# Patient Record
Sex: Female | Born: 1965 | Race: White | Hispanic: No | Marital: Married | Smoking: Never smoker
Health system: Southern US, Community
[De-identification: ages and names within clinical notes are randomized; demographics above are authoritative.]

## PROBLEM LIST (undated history)

## (undated) DIAGNOSIS — J309 Allergic rhinitis, unspecified: Secondary | ICD-10-CM

## (undated) DIAGNOSIS — J45909 Unspecified asthma, uncomplicated: Secondary | ICD-10-CM

## (undated) DIAGNOSIS — E079 Disorder of thyroid, unspecified: Secondary | ICD-10-CM

## (undated) DIAGNOSIS — F32A Depression, unspecified: Secondary | ICD-10-CM

## (undated) DIAGNOSIS — E785 Hyperlipidemia, unspecified: Secondary | ICD-10-CM

## (undated) DIAGNOSIS — M1990 Unspecified osteoarthritis, unspecified site: Secondary | ICD-10-CM

## (undated) DIAGNOSIS — M797 Fibromyalgia: Secondary | ICD-10-CM

## (undated) DIAGNOSIS — E039 Hypothyroidism, unspecified: Secondary | ICD-10-CM

## (undated) DIAGNOSIS — F329 Major depressive disorder, single episode, unspecified: Secondary | ICD-10-CM

## (undated) DIAGNOSIS — L309 Dermatitis, unspecified: Secondary | ICD-10-CM

## (undated) DIAGNOSIS — G894 Chronic pain syndrome: Secondary | ICD-10-CM

## (undated) DIAGNOSIS — N390 Urinary tract infection, site not specified: Secondary | ICD-10-CM

## (undated) DIAGNOSIS — G43909 Migraine, unspecified, not intractable, without status migrainosus: Secondary | ICD-10-CM

## (undated) DIAGNOSIS — K559 Vascular disorder of intestine, unspecified: Secondary | ICD-10-CM

## (undated) DIAGNOSIS — F419 Anxiety disorder, unspecified: Secondary | ICD-10-CM

## (undated) DIAGNOSIS — K219 Gastro-esophageal reflux disease without esophagitis: Secondary | ICD-10-CM

## (undated) HISTORY — DX: Hyperlipidemia, unspecified: E78.5

## (undated) HISTORY — DX: Migraine, unspecified, not intractable, without status migrainosus: G43.909

## (undated) HISTORY — DX: Allergic rhinitis, unspecified: J30.9

## (undated) HISTORY — PX: TUBAL LIGATION: SHX77

## (undated) HISTORY — DX: Major depressive disorder, single episode, unspecified: F32.9

## (undated) HISTORY — DX: Unspecified osteoarthritis, unspecified site: M19.90

## (undated) HISTORY — DX: Urinary tract infection, site not specified: N39.0

## (undated) HISTORY — DX: Depression, unspecified: F32.A

---

## 2005-01-14 ENCOUNTER — Ambulatory Visit: Payer: Self-pay | Admitting: Urology

## 2005-05-24 ENCOUNTER — Ambulatory Visit: Payer: Self-pay

## 2009-02-12 ENCOUNTER — Emergency Department (HOSPITAL_COMMUNITY): Admission: EM | Admit: 2009-02-12 | Discharge: 2009-02-12 | Payer: Self-pay | Admitting: Emergency Medicine

## 2010-05-23 ENCOUNTER — Ambulatory Visit: Payer: Self-pay | Admitting: Pain Medicine

## 2010-05-29 ENCOUNTER — Ambulatory Visit: Payer: Self-pay | Admitting: Family Medicine

## 2010-06-04 ENCOUNTER — Ambulatory Visit: Payer: Self-pay | Admitting: Pain Medicine

## 2010-06-07 ENCOUNTER — Ambulatory Visit: Payer: Self-pay | Admitting: Pain Medicine

## 2010-06-18 ENCOUNTER — Ambulatory Visit: Payer: Self-pay | Admitting: Pain Medicine

## 2010-06-19 ENCOUNTER — Ambulatory Visit: Payer: Self-pay | Admitting: Pain Medicine

## 2010-06-21 ENCOUNTER — Ambulatory Visit: Payer: Self-pay | Admitting: Pain Medicine

## 2010-11-18 LAB — DIFFERENTIAL
Basophils Absolute: 0.1 10*3/uL (ref 0.0–0.1)
Basophils Relative: 1 % (ref 0–1)
Eosinophils Absolute: 0.1 10*3/uL (ref 0.0–0.7)
Eosinophils Relative: 1 % (ref 0–5)
Monocytes Absolute: 0.6 10*3/uL (ref 0.1–1.0)
Neutro Abs: 5.5 10*3/uL (ref 1.7–7.7)

## 2010-11-18 LAB — CBC
HCT: 37.8 % (ref 36.0–46.0)
Platelets: 282 10*3/uL (ref 150–400)
RDW: 12.3 % (ref 11.5–15.5)

## 2010-11-18 LAB — URINALYSIS, ROUTINE W REFLEX MICROSCOPIC
Hgb urine dipstick: NEGATIVE
Protein, ur: NEGATIVE mg/dL
Urobilinogen, UA: 0.2 mg/dL (ref 0.0–1.0)

## 2010-11-18 LAB — POCT PREGNANCY, URINE: Preg Test, Ur: NEGATIVE

## 2012-05-20 ENCOUNTER — Emergency Department: Payer: Self-pay | Admitting: Emergency Medicine

## 2012-05-20 LAB — URINALYSIS, COMPLETE
Bilirubin,UR: NEGATIVE
Blood: NEGATIVE
Glucose,UR: NEGATIVE mg/dL (ref 0–75)
Ketone: NEGATIVE
Leukocyte Esterase: NEGATIVE
Specific Gravity: 1.005 (ref 1.003–1.030)
Squamous Epithelial: 4

## 2012-05-20 LAB — COMPREHENSIVE METABOLIC PANEL
Albumin: 3.7 g/dL (ref 3.4–5.0)
Alkaline Phosphatase: 92 U/L (ref 50–136)
Anion Gap: 8 (ref 7–16)
Bilirubin,Total: 0.2 mg/dL (ref 0.2–1.0)
Calcium, Total: 9 mg/dL (ref 8.5–10.1)
Creatinine: 0.78 mg/dL (ref 0.60–1.30)
EGFR (African American): >60
EGFR (Non-African Amer.): >60
Glucose: 84 mg/dL (ref 65–99)
Osmolality: 279 (ref 275–301)
Sodium: 141 mmol/L (ref 136–145)

## 2012-05-20 LAB — TROPONIN I: Troponin-I: <0.02 ng/mL

## 2012-05-20 LAB — CBC
HCT: 40.3 % (ref 35.0–47.0)
MCH: 30.4 pg (ref 26.0–34.0)
MCHC: 34.6 g/dL (ref 32.0–36.0)
RDW: 12.5 % (ref 11.5–14.5)

## 2012-06-30 ENCOUNTER — Ambulatory Visit: Payer: Self-pay | Admitting: Neurology

## 2012-09-19 ENCOUNTER — Emergency Department: Payer: Self-pay | Admitting: Internal Medicine

## 2012-09-19 LAB — RAPID INFLUENZA A&B ANTIGENS

## 2013-06-28 ENCOUNTER — Emergency Department: Payer: Self-pay | Admitting: Emergency Medicine

## 2013-06-28 LAB — URINALYSIS, COMPLETE
Bacteria: NONE SEEN
Blood: NEGATIVE
Ketone: NEGATIVE
Leukocyte Esterase: NEGATIVE
Nitrite: NEGATIVE
Ph: 6 (ref 4.5–8.0)
Protein: NEGATIVE
Squamous Epithelial: 1
WBC UR: 2 /HPF (ref 0–5)

## 2013-06-28 LAB — CBC
HGB: 13.8 g/dL (ref 12.0–16.0)
Platelet: 356 10*3/uL (ref 150–440)
RBC: 4.58 10*6/uL (ref 3.80–5.20)
RDW: 13 % (ref 11.5–14.5)

## 2013-06-28 LAB — COMPREHENSIVE METABOLIC PANEL
Bilirubin,Total: 0.2 mg/dL (ref 0.2–1.0)
Calcium, Total: 8.9 mg/dL (ref 8.5–10.1)
Creatinine: 0.86 mg/dL (ref 0.60–1.30)
EGFR (African American): >60
Glucose: 89 mg/dL (ref 65–99)
Osmolality: 273 (ref 275–301)
SGOT(AST): 23 U/L (ref 15–37)
Sodium: 137 mmol/L (ref 136–145)
Total Protein: 7.3 g/dL (ref 6.4–8.2)

## 2013-06-28 LAB — DIFFERENTIAL
Eosinophil #: 0.3 10*3/uL (ref 0.0–0.7)
Eosinophil %: 1.4 %
Lymphocyte #: 4.7 10*3/uL — ABNORMAL HIGH (ref 1.0–3.6)
Lymphocyte %: 26.5 %

## 2013-06-28 LAB — WET PREP, GENITAL

## 2013-07-29 DIAGNOSIS — N3020 Other chronic cystitis without hematuria: Secondary | ICD-10-CM | POA: Insufficient documentation

## 2013-07-29 DIAGNOSIS — N393 Stress incontinence (female) (male): Secondary | ICD-10-CM | POA: Insufficient documentation

## 2013-07-29 DIAGNOSIS — N2889 Other specified disorders of kidney and ureter: Secondary | ICD-10-CM | POA: Insufficient documentation

## 2014-05-28 ENCOUNTER — Inpatient Hospital Stay: Payer: Self-pay | Admitting: Internal Medicine

## 2014-05-28 LAB — CBC WITH DIFFERENTIAL/PLATELET
BASOS ABS: 0.1 10*3/uL (ref 0.0–0.1)
BASOS ABS: 0.1 10*3/uL (ref 0.0–0.1)
BASOS ABS: 0.1 10*3/uL (ref 0.0–0.1)
BASOS PCT: 0.6 %
BASOS PCT: 0.6 %
BASOS PCT: 0.6 %
EOS ABS: 0.2 10*3/uL (ref 0.0–0.7)
EOS ABS: 0.1 10*3/uL (ref 0.0–0.7)
EOS PCT: 1.5 %
Eosinophil #: 0 10*3/uL (ref 0.0–0.7)
Eosinophil %: 0.3 %
Eosinophil %: 0.8 %
HCT: 46 % (ref 35.0–47.0)
HCT: 43.5 % (ref 35.0–47.0)
HCT: 41.5 % (ref 35.0–47.0)
HGB: 15.3 g/dL (ref 12.0–16.0)
HGB: 13.9 g/dL (ref 12.0–16.0)
HGB: 13.7 g/dL (ref 12.0–16.0)
LYMPHS ABS: 2 10*3/uL (ref 1.0–3.6)
LYMPHS PCT: 12.6 %
Lymphocyte #: 3 10*3/uL (ref 1.0–3.6)
Lymphocyte #: 1.8 10*3/uL (ref 1.0–3.6)
Lymphocyte %: 21.6 %
Lymphocyte %: 13.7 %
MCH: 29.7 pg (ref 26.0–34.0)
MCH: 28.7 pg (ref 26.0–34.0)
MCH: 29.5 pg (ref 26.0–34.0)
MCHC: 33.4 g/dL (ref 32.0–36.0)
MCHC: 32 g/dL (ref 32.0–36.0)
MCHC: 33 g/dL (ref 32.0–36.0)
MCV: 89 fL (ref 80–100)
MCV: 90 fL (ref 80–100)
MCV: 90 fL (ref 80–100)
MONO ABS: 1 x10 3/mm — AB (ref 0.2–0.9)
MONO ABS: 0.9 x10 3/mm (ref 0.2–0.9)
MONO ABS: 1.4 x10 3/mm — AB (ref 0.2–0.9)
MONOS PCT: 6.3 %
Monocyte %: 7.5 %
Monocyte %: 9.4 %
NEUTROS ABS: 9.5 10*3/uL — AB (ref 1.4–6.5)
NEUTROS ABS: 10.8 10*3/uL — AB (ref 1.4–6.5)
NEUTROS PCT: 68.8 %
NEUTROS PCT: 80.2 %
NEUTROS PCT: 75.5 %
Neutrophil #: 11.3 10*3/uL — ABNORMAL HIGH (ref 1.4–6.5)
PLATELETS: 273 10*3/uL (ref 150–440)
Platelet: 240 10*3/uL (ref 150–440)
Platelet: 231 10*3/uL (ref 150–440)
RBC: 5.16 10*6/uL (ref 3.80–5.20)
RBC: 4.85 10*6/uL (ref 3.80–5.20)
RBC: 4.64 10*6/uL (ref 3.80–5.20)
RDW: 12.9 % (ref 11.5–14.5)
RDW: 12.9 % (ref 11.5–14.5)
RDW: 12.9 % (ref 11.5–14.5)
WBC: 13.8 10*3/uL — AB (ref 3.6–11.0)
WBC: 14.1 10*3/uL — AB (ref 3.6–11.0)
WBC: 14.4 10*3/uL — AB (ref 3.6–11.0)

## 2014-05-28 LAB — COMPREHENSIVE METABOLIC PANEL
ALT: 27 U/L
AST: 29 U/L (ref 15–37)
Albumin: 4 g/dL (ref 3.4–5.0)
Alkaline Phosphatase: 148 U/L — ABNORMAL HIGH
Anion Gap: 9 (ref 7–16)
BILIRUBIN TOTAL: 0.3 mg/dL (ref 0.2–1.0)
BUN: 8 mg/dL (ref 7–18)
CALCIUM: 8.8 mg/dL (ref 8.5–10.1)
CHLORIDE: 106 mmol/L (ref 98–107)
CREATININE: 0.91 mg/dL (ref 0.60–1.30)
Co2: 25 mmol/L (ref 21–32)
EGFR (African American): >60
GLUCOSE: 94 mg/dL (ref 65–99)
OSMOLALITY: 277 (ref 275–301)
Potassium: 3.8 mmol/L (ref 3.5–5.1)
SODIUM: 140 mmol/L (ref 136–145)
TOTAL PROTEIN: 8 g/dL (ref 6.4–8.2)

## 2014-05-28 LAB — PREGNANCY, URINE: PREGNANCY TEST, URINE: NEGATIVE m[IU]/mL

## 2014-05-28 LAB — URINALYSIS, COMPLETE
BILIRUBIN, UR: NEGATIVE
Bacteria: NONE SEEN
GLUCOSE, UR: NEGATIVE mg/dL (ref 0–75)
Ketone: NEGATIVE
Leukocyte Esterase: NEGATIVE
NITRITE: NEGATIVE
PROTEIN: NEGATIVE
Ph: 5 (ref 4.5–8.0)
RBC,UR: 4 /HPF (ref 0–5)
SPECIFIC GRAVITY: 1.014 (ref 1.003–1.030)
Squamous Epithelial: <1
WBC UR: 1 /HPF (ref 0–5)

## 2014-05-28 LAB — CLOSTRIDIUM DIFFICILE(ARMC)

## 2014-05-28 LAB — LIPASE, BLOOD: LIPASE: 143 U/L (ref 73–393)

## 2014-05-28 LAB — TROPONIN I: Troponin-I: <0.02 ng/mL

## 2014-05-29 LAB — COMPREHENSIVE METABOLIC PANEL
ALBUMIN: 3.6 g/dL (ref 3.4–5.0)
Alkaline Phosphatase: 111 U/L
Anion Gap: 11 (ref 7–16)
BUN: 7 mg/dL (ref 7–18)
Bilirubin,Total: 0.4 mg/dL (ref 0.2–1.0)
CALCIUM: 8.6 mg/dL (ref 8.5–10.1)
CHLORIDE: 104 mmol/L (ref 98–107)
CO2: 24 mmol/L (ref 21–32)
Creatinine: 0.96 mg/dL (ref 0.60–1.30)
GLUCOSE: 130 mg/dL — AB (ref 65–99)
OSMOLALITY: 277 (ref 275–301)
POTASSIUM: 3.6 mmol/L (ref 3.5–5.1)
SGOT(AST): 21 U/L (ref 15–37)
SGPT (ALT): 21 U/L
Sodium: 139 mmol/L (ref 136–145)
TOTAL PROTEIN: 7.3 g/dL (ref 6.4–8.2)

## 2014-05-29 LAB — CBC WITH DIFFERENTIAL/PLATELET
BASOS ABS: 0.1 10*3/uL (ref 0.0–0.1)
Basophil %: 0.4 %
EOS ABS: 0.2 10*3/uL (ref 0.0–0.7)
EOS PCT: 1.5 %
HCT: 41.3 % (ref 35.0–47.0)
HGB: 13.9 g/dL (ref 12.0–16.0)
Lymphocyte #: 1.8 10*3/uL (ref 1.0–3.6)
Lymphocyte %: 12.9 %
MCH: 29.9 pg (ref 26.0–34.0)
MCHC: 33.6 g/dL (ref 32.0–36.0)
MCV: 89 fL (ref 80–100)
MONO ABS: 1.1 x10 3/mm — AB (ref 0.2–0.9)
MONOS PCT: 7.6 %
NEUTROS ABS: 10.7 10*3/uL — AB (ref 1.4–6.5)
Neutrophil %: 77.6 %
Platelet: 227 10*3/uL (ref 150–440)
RBC: 4.63 10*6/uL (ref 3.80–5.20)
RDW: 13.2 % (ref 11.5–14.5)
WBC: 13.8 10*3/uL — AB (ref 3.6–11.0)

## 2014-05-29 LAB — T4, FREE: FREE THYROXINE: 0.73 ng/dL — AB (ref 0.76–1.46)

## 2014-05-29 LAB — TSH: THYROID STIMULATING HORM: 42.4 u[IU]/mL — AB

## 2014-05-30 LAB — STOOL CULTURE

## 2014-06-01 LAB — PATHOLOGY REPORT

## 2014-06-02 LAB — CULTURE, BLOOD (SINGLE)

## 2014-08-08 DIAGNOSIS — B9689 Other specified bacterial agents as the cause of diseases classified elsewhere: Secondary | ICD-10-CM | POA: Insufficient documentation

## 2014-12-03 NOTE — Consult Note (Signed)
Chief Complaint:  Subjective/Chief Complaint Overall much better. Less abdominal pain. Had 2 bloody BM's last night.   VITAL SIGNS/ANCILLARY NOTES: **Vital Signs.:   18-Oct-15 07:39  Vital Signs Type Q 4hr  Temperature Temperature (F) 98  Celsius 36.6  Temperature Source oral  Pulse Pulse 79  Respirations Respirations 18  Systolic BP Systolic BP 798  Diastolic BP (mmHg) Diastolic BP (mmHg) 75  Mean BP 86  Systolic BP Systolic BP 921  Diastolic BP (mmHg) Diastolic BP (mmHg) 75  Pulse Lying Pulse Lying 86  Systolic BP Systolic BP 194  Diastolic BP (mmHg) Diastolic BP (mmHg) 81  Pulse Pulse Sitting 97  Systolic BP Systolic BP 174  Diastolic BP (mmHg) Diastolic BP (mmHg) 76  Pulse Standing Pulse Standing 111  Pulse Ox % Pulse Ox % 97  Pulse Ox Activity Level  At rest  Oxygen Delivery Room Air/ 21 %   Brief Assessment:  GEN no acute distress   Cardiac Regular   Respiratory clear BS   Gastrointestinal mild left sided abdominal tenderness   Lab Results: Thyroid:  18-Oct-15 05:16   Thyroid Stimulating Hormone  42.4 (0.45-4.50 (IU = International Unit)  ----------------------- Pregnant patients have  different reference  ranges for TSH:  - - - - - - - - - -  Pregnant, first trimetser:  0.36 - 2.50 uIU/mL)  Hepatic:  18-Oct-15 05:16   Bilirubin, Total 0.4  Alkaline Phosphatase 111 (46-116 NOTE: New Reference Range 03/01/14)  SGPT (ALT) 21 (14-63 NOTE: New Reference Range 03/01/14)  SGOT (AST) 21  Total Protein, Serum 7.3  Albumin, Serum 3.6  Routine Chem:  18-Oct-15 05:16   Glucose, Serum  130  BUN 7  Creatinine (comp) 0.96  Sodium, Serum 139  Potassium, Serum 3.6  Chloride, Serum 104  CO2, Serum 24  Calcium (Total), Serum 8.6  Osmolality (calc) 277  eGFR (African American) >60  eGFR (Non-African American) >60 (eGFR values <71m/min/1.73 m2 may be an indication of chronic kidney disease (CKD). Calculated eGFR, using the MRDR Study equation, is useful  in  patients with stable renal function. The eGFR calculation will not be reliable in acutely ill patients when serum creatinine is changing rapidly. It is not useful in patients on dialysis. The eGFR calculation may not be applicable to patients at the low and high extremes of body sizes, pregnant women, and vetetarians.)  Anion Gap 11  Routine Hem:  18-Oct-15 05:16   WBC (CBC)  13.8  RBC (CBC) 4.63  Hemoglobin (CBC) 13.9  Hematocrit (CBC) 41.3  Platelet Count (CBC) 227  MCV 89  MCH 29.9  MCHC 33.6  RDW 13.2  Neutrophil % 77.6  Lymphocyte % 12.9  Monocyte % 7.6  Eosinophil % 1.5  Basophil % 0.4  Neutrophil #  10.7  Lymphocyte # 1.8  Monocyte #  1.1  Eosinophil # 0.2  Basophil # 0.1 (Result(s) reported on 29 May 2014 at 0[facility])   Radiology Results: CT:    17-Oct-15 03:56, CT Abdomen and Pelvis With Contrast  CT Abdomen and Pelvis With Contrast   REASON FOR EXAM:    (1) LLQ PAIN; (2) RECTAL BLEEDING;    NOTE: Nursing   to Give Oral CT Contrast  COMMENTS:   May transport without cardiac monitor    PROCEDURE: CT  - CT ABDOMEN / PELVIS  W  - May 28 2014  3:56AM     CLINICAL DATA:  Left lower quadrant pain and rectal bleeding.  Initial encounter    EXAM:  CT ABDOMEN AND PELVIS WITH CONTRAST    TECHNIQUE:  Multidetector CT imaging of the abdomen and pelvis was performed  using the standard protocol following bolus administration of  intravenous contrast.    CONTRAST:  100 cc Isovue-300 intravenous    COMPARISON:  06/28/2013    FINDINGS:  BODY WALL: Unremarkable.    LOWER CHEST: Unremarkable.    ABDOMEN/PELVIS:    Liver: No focal abnormality.  Biliary: No evidence of biliary obstruction or stone.    Pancreas: Unremarkable.    Spleen: Unremarkable.    Adrenals: Unremarkable.    Kidneys and ureters: 7 mm lesion in the lower pole right kidney has  an unchanged size and appearance. On delayed imaging Hounsfield  units measure 60 to 70, greater than on  precontrast imaging. No  hydronephrosis.    Bladder: Unremarkable.  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    Bowel: There is circumferential thickening of the distal transverse  colon secondary a submucosal edema. These changes are fairly focal.  Colitis such as this is usually infectious, occasionally  inflammatory. These changes are near the watershed region of the  colon, but there is no significant atherosclerotic disease typically  seen in patients with ischemic colitis. There is no related  obstruction or perforation. Negative appendix.    Retroperitoneum: No mass or adenopathy.    Peritoneum: No ascites or pneumoperitoneum.  Vascular: No acute abnormality.    OSSEOUS: No acute abnormalities.     IMPRESSION:  1. Transverse colitis.  Further discussion recorded above.  2. Unchanged 7 mm lesion in the right kidney which may have delayed  enhancement. A renal MRI could likely definitively characterize.  3. Left gonadal vein reflux.      Electronically Signed    By: Jorje Guild M.D.    On: 05/28/2014 04:15     Verified By: Gilford Silvius, M.D.,   Assessment/Plan:  Assessment/Plan:  Assessment Colitis.   Plan Plan colonoscopy tomorrow after bowel prep today.   Electronic Signatures: Verdie Shire (MD)  (Signed 18-Oct-15 11:07)  Authored: Chief Complaint, VITAL SIGNS/ANCILLARY NOTES, Brief Assessment, Lab Results, Radiology Results, Assessment/Plan   Last Updated: 18-Oct-15 11:07 by Verdie Shire (MD)

## 2014-12-03 NOTE — Discharge Summary (Signed)
Dates of Admission and Diagnosis:  Date of Admission 28-May-2014   Date of Discharge 30-May-2014   Admitting Diagnosis colitis   Final Diagnosis Transverse colon - ischemic colitis Hypothyroidism Asthma Fibromyalgia.    Chief Complaint/History of Present Illness a 49 year old Caucasian female with a past medical history of hypothyroidism, fibromyalgia/depression, history of irritable bowel syndrome, bronchial asthma/allergic rhinitis, history of migraine headaches and chronic insomnia, presents to the Emergency Room with complaints of left-sided abdominal pain associated with some loose stools and bright red blood per rectum of 1 day???s duration. The patient states she was in her usual state of health until last night around 7:00 p.m. She started having first the left-sided abdominal pain, following which she had some loose stools and then she has noticed some bright red blood in the stools. She denies any fever. No nausea. No vomiting. She denies any chest pain or shortness of breath. No dizziness. No loss of consciousness. No similar episodes of rectal bleeding or abdominal pain in the past. She does have a history of fibromyalgia and irritable bowel syndrome. She takes citalopram for her fibromyalgia and depression, but she does not take any medication for her IBS. In the Emergency Room, the patient was evaluated by the ED physician and was found to have elevated leukocytosis. A CT of the abdomen revealed transverse colitis. Hence, hospitalist service was consulted for further evaluation and management.   The patient received some pain medications in the Emergency Room and states right now she is feeling better and the pain is under control. She does not complain of any rectal bleeding at this time   Allergies:  Bactrim: Unknown  PERTINENT RADIOLOGY STUDIES: CT:    17-Oct-15 03:56, CT Abdomen and Pelvis With Contrast  CT Abdomen and Pelvis With Contrast   REASON FOR EXAM:    (1) LLQ  PAIN; (2) RECTAL BLEEDING;    NOTE: Nursing   to Give Oral CT Contrast  COMMENTS:   May transport without cardiac monitor    PROCEDURE: CT  - CT ABDOMEN / PELVIS  W  - May 28 2014  3:56AM     CLINICAL DATA:  Left lower quadrant pain and rectal bleeding.  Initial encounter    EXAM:  CT ABDOMEN AND PELVIS WITH CONTRAST    TECHNIQUE:  Multidetector CT imaging of the abdomen and pelvis was performed  using the standard protocol following bolus administration of  intravenous contrast.    CONTRAST:  100 cc Isovue-300 intravenous    COMPARISON:  06/28/2013    FINDINGS:  BODY WALL: Unremarkable.    LOWER CHEST: Unremarkable.    ABDOMEN/PELVIS:    Liver: No focal abnormality.  Biliary: No evidence of biliary obstruction or stone.    Pancreas: Unremarkable.    Spleen: Unremarkable.    Adrenals: Unremarkable.    Kidneys and ureters: 7 mm lesion in the lower pole right kidney has  an unchanged size and appearance. On delayed imaging Hounsfield  units measure 60 to 70, greater than on precontrast imaging. No  hydronephrosis.    Bladder: Unremarkable.  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    Bowel: There is circumferential thickening of the distal transverse  colon secondary a submucosal edema. These changes are fairly focal.  Colitis such as this is usually infectious, occasionally  inflammatory. These changes are near the watershed region of the  colon, but there is no significant atherosclerotic disease typically  seen in patients  with ischemic colitis. There is no related  obstruction or perforation. Negative appendix.    Retroperitoneum: No mass or adenopathy.    Peritoneum: No ascites or pneumoperitoneum.  Vascular: No acute abnormality.    OSSEOUS: No acute abnormalities.     IMPRESSION:  1. Transverse colitis.  Further discussion recorded above.  2. Unchanged 7 mm lesion in the right kidney which may have  delayed  enhancement. A renal MRI could likely definitively characterize.  3. Left gonadal vein reflux.      Electronically Signed    By: Jorje Guild M.D.    On: 05/28/2014 04:15     Verified By: Gilford Silvius, M.D.,   Pertinent Past History:  Pertinent Past History 1.  Fibromyalgia/depression.  2.  Hypothyroidism.  3.  Irritable bowel syndrome.  4.  Bronchial asthma/allergic rhinitis.  5.  History of migraine headaches.  6.  Chronic insomnia.   Hospital Course:  Hospital Course a 49 year old Caucasian female with a past medical history of fibromyalgia/depression, hypothyroidism, irritable bowel syndrome, bronchial asthma/allergic rhinitis, history of migraine headaches and chronic insomnia, presents to the Emergency Room with the complaints of left-sided abdominal pain followed by loose stools with bright red blood in the stool  * Transverse colitis-    Blood cultures, stool studies for culture, Clostridium difficile ( negative) and ova and parasites.   Cipro and Flagyl. Pain control medications. Follow  serial CBCs. Hb stable.   Appreciated Gi - colonoscopy done- shows terminal transverse colon likely ischemic colitis.   Suggested to cont Oral antibiotics- and follow in office in next 2 weeks.    *  History of fibromyalgia, stable on citalopram.  *  History of irritable bowel syndrome. No GI work-up done in the past. The patient usually does not need any medications.   *  History of hypothyroidism on levothyroxine supplementation, stable. Checked TFTs increased dose. *  History of bronchial asthma/allergic rhinitis. Takes Advair inhaler as needed only. The patient is stable. Continue same.   Condition on Discharge Stable   Code Status:  Code Status Full Code   DISCHARGE INSTRUCTIONS HOME MEDS:  Medication Reconciliation: Patient's Home Medications at Discharge:     Medication Instructions  nasacort aq 55 mcg/inh nasal spray  1 spray(s) nasal 2 times a day, As  Needed   citalopram 40 mg oral tablet  1 tab(s) orally once a day   montelukast 10 mg oral tablet  1 tab(s) orally once a day (in the evening)   diclofenac sodium 50 mg oral delayed release tablet  1 tab(s) orally once a day (at bedtime), As Needed - for Headache   zyrtec 10 mg oral tablet  1 tab(s) orally once a day   vitamin d3 2000 intl units oral capsule  1 cap(s) orally once a day   methylcobalamin  1000 microgram(s) sublingual once a day   levothyroxine 75 mcg (0.075 mg) oral tablet  1 tab(s) orally once a day   flagyl 500 mg oral tablet  1 tab(s) orally every 8 hours x 5 days   cipro 500 mg oral tablet  1 tab(s) orally every 12 hours x 5 days     Physician's Instructions:  Diet Low Sodium   Activity Limitations As tolerated   Return to Work Not Applicable   Time frame for Follow Up Appointment 1-2 weeks  Dr.Oh     Dionisio David M(Family Physician):   Electronic Signatures: Vaughan Basta (MD)  (Signed 22-Oct-15 17:14)  Authored: ADMISSION DATE AND  DIAGNOSIS, CHIEF COMPLAINT/HPI, Allergies, PERTINENT RADIOLOGY STUDIES, PERTINENT PAST HISTORY, HOSPITAL COURSE, DISCHARGE INSTRUCTIONS HOME MEDS, PATIENT INSTRUCTIONS, Follow Up Physician   Last Updated: 22-Oct-15 17:14 by Vaughan Basta (MD)

## 2014-12-03 NOTE — Consult Note (Signed)
PATIENT NAME:  Grace Shepard, Grace Shepard MR#:  [id number] DATE OF BIRTH:  [birth year]  DATE OF CONSULTATION:  05/28/2014  REFERRING PHYSICIAN:   CONSULTING PHYSICIAN:  Lupita Dawn. Candace Cruise, MD  REASON FOR CONSULTATION: Possible colitis.   HISTORY OF PRESENT ILLNESS: The patient is a 49 year old, white female, with history of fibromyalgia and supposedly history of irritable bowel syndrome, which was never clinically confirmed. She also has a history of migraine headaches and insomnia, who presents with acute left-sided abdominal pain that started last night. This was followed by diarrhea and then gross rectal bleeding. There were no fevers,  chills, nausea or vomiting. She denied having any chest pain or shortness of breath. She did not feel any dizziness or weakness. The patient never had any prior symptoms like this of rectal bleeding. She does not recall eating anything particular that made her sick. When she came to the Emergency Room, they did a blood test that showed an elevated white blood cell count. CT scan showed evidence of transverse colitis. Therefore, the patient was admitted. The patient still has some pain, diarrhea and bleeding, although clinically she does feel better.   PAST MEDICAL HISTORY: Notable for fibromyalgia, depression, hypothyroidism, irritable bowel syndrome and asthma. She also has history of insomnia and migraine headaches.   PAST SURGICAL HISTORY: Includes a tubal ligation, exploratory surgery.   HOME MEDICATIONS:  Inhalers, citalopram, levothyroxine, Norco as needed, Sudafed as needed.   ALLERGIES: BACTRIM.   SOCIAL HISTORY: She denies alcohol and tobacco use.   FAMILY HISTORY: Notable for breast cancer, bladder cancer and coronary artery disease.   REVIEW OF SYSTEMS: There is really no change from initial history and physical dictated by the admitting doctor. Please refer to this.   PHYSICAL EXAMINATION:  GENERAL: The patient appears to be in no acute distress.  VITAL SIGNS:  She is afebrile. Her vital signs are stable at this time, although blood pressure is somewhat elevated at 152/97.  HEENT: Normocephalic, atraumatic. Pupils are equally reactive. Throat was clear.  NECK: Supple.  CARDIAC: Revealed regular rhythm and rate without murmurs.  LUNGS: Clear bilaterally.  ABDOMEN: Normoactive bowel sounds. It was soft. There is tenderness in the left side of the abdomen. There is no rebound or guarding. She has active bowel sounds. There is no hepatomegaly.  EXTREMITIES: No clubbing, cyanosis or edema.  NEUROLOGIC: Examination is nonfocal.  SKIN: Normal.   LABORATORY AND DIAGNOSTIC DATA: Electrolytes are completely normal. Lipase 143. Liver enzymes are normal except for alkaline phosphatase at 148. Troponin level was normal. White count was 13.8, hemoglobin 15.3, platelet count 273,000.   Stool tests, so far negative. Urinalysis was negative. Clostridium difficile was negative.   Pregnancy test was negative.   CT scan showed inflammation in the distal transverse colon.    IMPRESSION: This is a patient with an acute bout of pain, diarrhea and rectal bleeding. CT suggests colitis. It is likely to be infectious, although due to the distribution of colitis, ischemic colitis is possible, although less likely. The patient does have a history of irritable bowel syndrome, but colitis and rectal bleeding does not occur in irritable bowel syndrome.   I agree with antibiotics. The patient will likely need a colonoscopy since she has never had one before. We will plan on scheduling it on Monday unless condition changes.   Thank you for the referral.    ____________________________ Lupita Dawn. Candace Cruise, MD pyo:JT D: 05/29/2014 09:31:44 ET T: 05/29/2014 10:38:29 ET JOB#: [id number]  cc: Lupita Dawn. Eddy Termine,  MD, <Dictator> Lupita Dawn Tylen Leverich MD ELECTRONICALLY SIGNED 05/30/2014 9:40

## 2014-12-03 NOTE — Consult Note (Signed)
Overall doing better. No significant bleeding with bowel prep overnight. Colonoscopy showed colitis mainly involving distal transverse colon. Biopsies taken. Findings suggest ischemic colitis, which usually resolve on own. [place] for discharge later today on oral Abx. Pt sensitive to gluten. So, maintain on gluten free diet at home. Will sign off. Make sure to evaluate her thyroid function. Can f/u in our office in few weeks. Thanks.  Electronic Signatures: Verdie Shire (MD)  (Signed on 19-Oct-15 14:36)  Authored  Last Updated: 19-Oct-15 14:36 by Verdie Shire (MD)

## 2014-12-03 NOTE — H&P (Signed)
PATIENT NAME:  Grace Shepard, NARVAEZ MR#:  [id number] DATE OF BIRTH:  [birth year]  DATE OF ADMISSION:  05/28/2014  REFERRING PHYSICIAN: Gretchen Short. Beather Arbour, MD  PRIMARY CARE DOCTOR:  Vevelyn Francois, NP  ADMITTING DOCTOR: Juluis Mire, MD  CHIEF COMPLAINT: Left-sided abdominal pain associated with some diarrhea and rectal bleeding of 1 day's duration.   HISTORY OF PRESENT ILLNESS: The patient is a 49 year old Caucasian female with a past medical history of hypothyroidism, fibromyalgia/depression, history of irritable bowel syndrome, bronchial asthma/allergic rhinitis, history of migraine headaches and chronic insomnia, presents to the Emergency Room with complaints of left-sided abdominal pain associated with some loose stools and bright red blood per rectum of 1 day's duration. The patient states she was in her usual state of health until last night around 7:00 p.m. She started having first the left-sided abdominal pain, following which she had some loose stools and then she has noticed some bright red blood in the stools. She denies any fever. No nausea. No vomiting. She denies any chest pain or shortness of breath. No dizziness. No loss of consciousness. No similar episodes of rectal bleeding or abdominal pain in the past. She does have a history of fibromyalgia and irritable bowel syndrome. She takes citalopram for her fibromyalgia and depression, but she does not take any medication for her IBS. In the Emergency Room, the patient was evaluated by the ED physician and was found to have elevated leukocytosis. A CT of the abdomen revealed transverse colitis. Hence, hospitalist service was consulted for further evaluation and management.   The patient received some pain medications in the Emergency Room and states right now she is feeling better and the pain is under control. She does not complain of any rectal bleeding at this time.   PAST MEDICAL HISTORY:  1.  Fibromyalgia/depression.  2.  Hypothyroidism.   3.  Irritable bowel syndrome.  4.  Bronchial asthma/allergic rhinitis.  5.  History of migraine headaches.  6.  Chronic insomnia.   PAST SURGICAL HISTORY:  1.  Tubal ligation.  2.  GYN exploratory surgery.   HOME MEDICATIONS:  1.  Advair Diskus 100/50 mcg 1 puff twice a day as needed.   2.  Citalopram 20 mg 1 tablet orally daily.  3.  Levothyroxine 75 mcg 1 tablet orally daily.  4.  Levothyroxine 5 mcg orally 1 tablet daily.  5.  Nasacort AQ nasal spray, 1 spray each nostril once a day.  6.  Norco 325/5 mg 1 tablet orally as needed.  7.  Nortrel 7/7/7 as dissected.  8.  Sudafed as needed.   ALLERGIES: BACTRIM.   SOCIAL HISTORY: She is married and lives with her husband. Denies any alcohol, smoking or drug usage.    FAMILY HISTORY: Maternal aunt with breast cancer and an uncle with bladder cancer. The mother's side is significant for coronary artery disease.   REVIEW OF SYSTEMS:  CONSTITUTIONAL: Denies any fever or fatigue. No abnormal weight gain or weight loss lately.  EYES: Negative for any blurred vision or double vision . No redness. No inflammation.  EARS, NOSE, AND THROAT: Negative for tinnitus, ear pain, hearing loss, epistaxis, nasal discharge or difficulty swallowing.  RESPIRATORY: Negative for cough, wheezing, hemoptysis or dyspnea, painful respirations. She does have a history of bronchial asthma, for which she takes Advair as needed only. CARDIOVASCULAR: Negative for any chest pain, shortness of breath, pedal edema, orthopnea, palpitations or dizziness.  GASTROINTESTINAL: Negative for nausea or vomiting. As mentioned in the history  of present illness, she developed left-sided abdominal pain followed by diarrhea and blood in the stools, which is bright red.  GENITOURINARY: Negative for any dysuria, hematuria, frequency, or urgency.  ENDOCRINE: Negative for polyuria, polydipsia, heat or cold intolerance.  HEMATOLOGIC: Negative for anemia or easy bruising.  SKIN:  Negative for acne, rash, or skin lesions.  MUSCULOSKELETAL: Negative for any arthritis, swelling, or gout.  NEUROLOGICAL: Negative for any focal, weakness, numbness, CVA, TIA, or seizure disorder.  PSYCHIATRIC: She does have a history of fibromyalgia/depression, for which she takes citalopram, and is under control.   PHYSICAL EXAMINATION:  VITAL SIGNS: Temperature 97.9, pulse rate 79 and regular, respirations 16, systolic blood pressure on admission 183/97 and currently is 152/97, pulse oximetry 96% on room air.  GENERAL: Well built and well nourished. Alert, awake, and oriented, not in any acute distress. Pleasant and cooperative.  HEAD:  Atraumatic, normocephalic.  EYES: Pupils are equal and reactive to light and accommodation. No conjunctival pallor. No scleral icterus. Extraocular movements intact.  NOSE: No nasal discharge. No external lesions.  MOUTH: No oral lesions. No exudates.  NECK: Supple. No JVD. No thyromegaly. No carotid bruit. Range of motion normal.  RESPIRATORY: Good respiratory effort. Not using accessory muscles of respiration. Bilaterally Bilateral vesicular breath sounds present. No rales or rhonchi.  CARDIOVASCULAR: S1, S2 regular. No murmurs, gallops, or clicks. Pulses equal at carotid, femoral, and pedal pulses. No peripheral edema.  GASTROINTESTINAL: Moderate tenderness on the left side of the abdomen. No guarding. No rigidity. Bowel sounds present and equal in all 4 quadrants. No hepatosplenomegaly.  GENITOURINARY: Deferred.  MUSCULOSKELETAL: Range of motion adequate. Strength and tone equal bilaterally in upper and lower extremities.  SKIN: Inspection within normal limits.  LYMPH NODES: Negative for cervical lymphadenopathy.  VASCULAR: Good dorsalis pedis and posterior tibial pulses.  NEUROLOGICAL: Alert, awake, and oriented x 3. Cranial nerves II through XII intact. Deep tendon reflexes 2+, symmetrical, and bilateral. Motor strength 5/5 in both upper and lower  extremities.  PSYCHIATRIC: Judgment and insight adequate. Alert and oriented x 3.   LABORATORY DATA: Serum glucose 94, BUN 8, creatinine 0.91, sodium 140, potassium 3.8, chloride 106, bicarbonate 25, calcium 8.8, lipase 143, total protein 8.0, albumin 4.0, total bilirubin 0.3, alkaline phosphatase 148, AST 29, ALT 27, troponin less than 0.02. CBC: White blood cell count 13.8, hemoglobin 15.3, hematocrit 46.0, platelet count 273,000.  Blood group O positive. Urinalysis: No bacteria, 4 RBCs per high power field. Urine pregnancy test negative.   IMAGING STUDIES: CT of the abdomen and the pelvis with contrast showed transverse colitis,   unchanged 7 mm lesion in the right kidney which may have delayed enhancement, and left gonadal vein reflux.   ASSESSMENT AND PLAN: The patient is a 49 year old Caucasian female with a past medical history of fibromyalgia/depression, hypothyroidism, irritable bowel syndrome, bronchial asthma/allergic rhinitis, history of migraine headaches and chronic insomnia, presents to the Emergency Room with the complaints of left-sided abdominal pain followed by loose stools with bright red blood in the stool, with onset starting around 7:00 p.m. last night.  1.  Left-sided abdominal pain with associated diarrhea and blood in the stools, with the leukocytosis and CT of the abdomen and the pelvis consistent with transverse colitis, likely infectious in origin. Admit to med/surg. Blood cultures, stool studies for culture, Clostridium difficile and ova and parasites. Start IV antibiotics: Cipro and Flagyl. Pain control medications. Follow  serial CBCs. Gastroenterology consult for further evaluation.   2.  History of fibromyalgia,  stable on citalopram.  3.  History of irritable bowel syndrome. No GI work-up done in the past. The patient usually does not need any medications.   4.  History of hypothyroidism on levothyroxine supplementation, stable. Check TFTs.  5.  History of bronchial  asthma/allergic rhinitis. Takes Advair inhaler as needed only. The patient is stable. Continue same.   CODE STATUS: Full Code.   TIME SPENT: 55 minutes     ____________________________ Juluis Mire, MD enr:MT D: 05/28/2014 06:16:57 ET T: 05/28/2014 06:51:53 ET JOB#: [id number]  cc: Juluis Mire, MD, <Dictator> Vevelyn Francois, NP Juluis Mire MD ELECTRONICALLY SIGNED 06/05/2014 4:14

## 2014-12-03 NOTE — Consult Note (Signed)
Pt seen and examined. Full consult to follow. Pt with acute left sided abdomial pain, followed by diarrehea and then rectal bleeding. Has hx of IBS though never clinically confirmed. No GI family hx. CT shows transverse colitis. Placed on Abx. Overall better but pain and rectal bleeding persists. Tender in left side. Has colitis, likely infectious but ischemic less likely. These symptoms should not be seen in IBS. Agree with Abx. Will likely need colonoscopy, perhaps on Monday. Will follow. Thanks.  Electronic Signatures: Verdie Shire (MD)  (Signed on 17-Oct-15 11:28)  Authored  Last Updated: 17-Oct-15 11:28 by Verdie Shire (MD)

## 2014-12-19 DIAGNOSIS — E039 Hypothyroidism, unspecified: Secondary | ICD-10-CM | POA: Insufficient documentation

## 2014-12-30 ENCOUNTER — Emergency Department
Admission: EM | Admit: 2014-12-30 | Discharge: 2014-12-30 | Discharge status: Against Medical Advice | Attending: Emergency Medicine | Admitting: Emergency Medicine

## 2014-12-30 ENCOUNTER — Encounter: Payer: Self-pay | Admitting: Emergency Medicine

## 2014-12-30 DIAGNOSIS — R1032 Left lower quadrant pain: Secondary | ICD-10-CM | POA: Insufficient documentation

## 2014-12-30 DIAGNOSIS — R109 Unspecified abdominal pain: Secondary | ICD-10-CM | POA: Diagnosis present

## 2014-12-30 HISTORY — DX: Vascular disorder of intestine, unspecified: K55.9

## 2014-12-30 HISTORY — DX: Disorder of thyroid, unspecified: E07.9

## 2014-12-30 LAB — COMPREHENSIVE METABOLIC PANEL
ALT: 23 U/L (ref 14–54)
ANION GAP: 7 (ref 5–15)
AST: 22 U/L (ref 15–41)
Albumin: 4.4 g/dL (ref 3.5–5.0)
Alkaline Phosphatase: 86 U/L (ref 38–126)
BILIRUBIN TOTAL: 0.3 mg/dL (ref 0.3–1.2)
BUN: 14 mg/dL (ref 6–20)
CALCIUM: 9 mg/dL (ref 8.9–10.3)
CHLORIDE: 105 mmol/L (ref 101–111)
CO2: 27 mmol/L (ref 22–32)
CREATININE: 0.79 mg/dL (ref 0.44–1.00)
GLUCOSE: 95 mg/dL (ref 65–99)
Potassium: 3.7 mmol/L (ref 3.5–5.1)
Sodium: 139 mmol/L (ref 135–145)
Total Protein: 7.7 g/dL (ref 6.5–8.1)

## 2014-12-30 LAB — CBC WITH DIFFERENTIAL/PLATELET
BASOS ABS: 0.1 10*3/uL (ref 0–0.1)
BASOS PCT: 1 %
EOS ABS: 0.2 10*3/uL (ref 0–0.7)
EOS PCT: 2 %
HEMATOCRIT: 42.1 % (ref 35.0–47.0)
HEMOGLOBIN: 14.3 g/dL (ref 12.0–16.0)
LYMPHS PCT: 22 %
Lymphs Abs: 1.9 10*3/uL (ref 1.0–3.6)
MCH: 30.2 pg (ref 26.0–34.0)
MCHC: 34 g/dL (ref 32.0–36.0)
MCV: 88.9 fL (ref 80.0–100.0)
MONO ABS: 0.7 10*3/uL (ref 0.2–0.9)
MONOS PCT: 8 %
NEUTROS ABS: 5.8 10*3/uL (ref 1.4–6.5)
NEUTROS PCT: 67 %
Platelets: 268 10*3/uL (ref 150–440)
RBC: 4.74 MIL/uL (ref 3.80–5.20)
RDW: 13.1 % (ref 11.5–14.5)
WBC: 8.7 10*3/uL (ref 3.6–11.0)

## 2014-12-30 LAB — URINALYSIS COMPLETE WITH MICROSCOPIC (ARMC ONLY)
Bilirubin Urine: NEGATIVE
Glucose, UA: NEGATIVE mg/dL
KETONES UR: NEGATIVE mg/dL
LEUKOCYTES UA: NEGATIVE
NITRITE: NEGATIVE
PH: 6 (ref 5.0–8.0)
PROTEIN: NEGATIVE mg/dL
Specific Gravity, Urine: 1.006 (ref 1.005–1.030)

## 2014-12-30 LAB — LIPASE, BLOOD: Lipase: 36 U/L (ref 22–51)

## 2014-12-30 NOTE — ED Notes (Signed)
Patient to ED with c/o LLQ abdominal pain that started initially last night but got worse this morning around 10am. Patient reports taking Tylenol which is helping with pain some. Patient reports that the pain occasionally radiates through to back. Denies urinary or bowel symptoms.

## 2014-12-30 NOTE — ED Notes (Signed)
Pt in no distress, skin warm and dry, pt advised of wait, verbalized understanding of wait

## 2014-12-30 NOTE — ED Notes (Signed)
Pt advised she was leaving, states she feels better and her MP just started and she thinks that is where the pain was coming from

## 2015-04-19 ENCOUNTER — Ambulatory Visit (INDEPENDENT_AMBULATORY_CARE_PROVIDER_SITE_OTHER): Admitting: Family Medicine

## 2015-04-19 VITALS — BP 116/78 | HR 71 | Temp 98.2°F | Ht 59.06 in | Wt 132.0 lb

## 2015-04-19 DIAGNOSIS — R42 Dizziness and giddiness: Secondary | ICD-10-CM

## 2015-04-19 DIAGNOSIS — M797 Fibromyalgia: Secondary | ICD-10-CM

## 2015-04-19 DIAGNOSIS — R413 Other amnesia: Secondary | ICD-10-CM | POA: Diagnosis not present

## 2015-04-19 DIAGNOSIS — Z8669 Personal history of other diseases of the nervous system and sense organs: Secondary | ICD-10-CM

## 2015-04-19 DIAGNOSIS — R079 Chest pain, unspecified: Secondary | ICD-10-CM | POA: Diagnosis not present

## 2015-04-19 DIAGNOSIS — R631 Polydipsia: Secondary | ICD-10-CM

## 2015-04-19 LAB — POCT URINALYSIS DIPSTICK
BILIRUBIN UA: NEGATIVE
GLUCOSE UA: NEGATIVE
KETONES UA: NEGATIVE
LEUKOCYTES UA: NEGATIVE
NITRITE UA: NEGATIVE
Protein, UA: NEGATIVE
Spec Grav, UA: 1.02
Urobilinogen, UA: 0.2
pH, UA: 6

## 2015-04-19 NOTE — Patient Instructions (Signed)
Nice to meet you. We will refer you to cardiology for evaluation of your chest pain.  Please drink plenty of water and rise from seated slowly to help prevent light headedness.  We will check some blood work and urine and call you with the results.  We will request you records and I will review them and call to discuss then next option for your fibromyalgia.  Seek medical attention if you develop chest pain, shortness of breath, passing out, palpitations, sweating, headache, numbness, weakness, slurred speech, or facial droop.

## 2015-04-19 NOTE — Progress Notes (Signed)
Pre visit review using our clinic review tool, if applicable. No additional management support is needed unless otherwise documented below in the visit note. 

## 2015-04-20 LAB — URINALYSIS, MICROSCOPIC ONLY

## 2015-04-20 LAB — COMPREHENSIVE METABOLIC PANEL
ALBUMIN: 4.3 g/dL (ref 3.5–5.2)
ALK PHOS: 56 U/L (ref 39–117)
ALT: 10 U/L (ref 0–35)
AST: 13 U/L (ref 0–37)
BILIRUBIN TOTAL: 0.3 mg/dL (ref 0.2–1.2)
BUN: 11 mg/dL (ref 6–23)
CALCIUM: 9.4 mg/dL (ref 8.4–10.5)
CO2: 26 meq/L (ref 19–32)
CREATININE: 0.77 mg/dL (ref 0.40–1.20)
Chloride: 104 mEq/L (ref 96–112)
GFR: 85.37 mL/min (ref >60.00)
Glucose, Bld: 72 mg/dL (ref 70–99)
Potassium: 4.3 mEq/L (ref 3.5–5.1)
Sodium: 138 mEq/L (ref 135–145)
Total Protein: 7.3 g/dL (ref 6.0–8.3)

## 2015-04-20 LAB — CBC
HEMATOCRIT: 42.2 % (ref 36.0–46.0)
Hemoglobin: 14.1 g/dL (ref 12.0–15.0)
MCHC: 33.3 g/dL (ref 30.0–36.0)
MCV: 90.3 fl (ref 78.0–100.0)
Platelets: 280 10*3/uL (ref 150.0–400.0)
RBC: 4.67 Mil/uL (ref 3.87–5.11)
RDW: 13 % (ref 11.5–15.5)
WBC: 9.7 10*3/uL (ref 4.0–10.5)

## 2015-04-20 LAB — VITAMIN B12: VITAMIN B 12: 656 pg/mL (ref 211–911)

## 2015-04-20 LAB — TSH: TSH: 3.25 u[IU]/mL (ref 0.35–4.50)

## 2015-04-21 ENCOUNTER — Encounter: Payer: Self-pay | Admitting: Family Medicine

## 2015-04-21 DIAGNOSIS — R42 Dizziness and giddiness: Secondary | ICD-10-CM | POA: Insufficient documentation

## 2015-04-21 DIAGNOSIS — G43909 Migraine, unspecified, not intractable, without status migrainosus: Secondary | ICD-10-CM | POA: Insufficient documentation

## 2015-04-21 DIAGNOSIS — R079 Chest pain, unspecified: Secondary | ICD-10-CM | POA: Insufficient documentation

## 2015-04-21 DIAGNOSIS — M797 Fibromyalgia: Secondary | ICD-10-CM | POA: Insufficient documentation

## 2015-04-21 DIAGNOSIS — R413 Other amnesia: Secondary | ICD-10-CM | POA: Insufficient documentation

## 2015-04-21 NOTE — Assessment & Plan Note (Signed)
Has history of migraine. No HA at this time. Neurologically intact. Continue to monitor for recurrence. Will continue to follow with neurology.

## 2015-04-21 NOTE — Assessment & Plan Note (Addendum)
Description of this is most consistent with orthostasis, though had negative orthostatics today. Could be related to cardiac cause, though find this less likely given lack of other symptoms. Advised to stay well hydrated. Will have patient see cardiology for her chest pain and have evaluation for cardiac cause of this. She does note some excessive thirst at times and could be an indication of dehydration. Will check CBC, TSH, CMET, and UA. Given return precautions.

## 2015-04-21 NOTE — Assessment & Plan Note (Signed)
Patient reports long history of this with pain scattered throughout her body. On exam today she has discomfort on palpation of her thighs that could be due to IT band syndrome or trochanteric bursitis. This could also account for her chest pain as well (see CP problem for discussion). Discussed referral to sports med for eval of this, though patient declined stating she would like to wait for me to receive records from prior physician to determine what has been tried thus far. Will continue to monitor at this time. Given return precautions.

## 2015-04-21 NOTE — Assessment & Plan Note (Signed)
Reports issue of getting lost on several occasions. Will check TSH and B12 today. Will have patient return for MMSE at next visit.

## 2015-04-21 NOTE — Assessment & Plan Note (Addendum)
Chest pain over the past 3-4 months atypical in location, type of pain, and lack of diaphoresis and non-exertional. Did radiate and have SOB with last episode. EKG reassuring today. Could be cardiac, though atypical pain makes this less likely. Doubt PE given normal HR and O2 sat with no history of PE. Normal lung exam and O2 sat makes pulmonary process unlikely. Could aslo be related to her fibromyalgia. No active CP. Will refer to cardiology for further evaluation. Will check CBC, CMET, and TSH. Given return precautions.

## 2015-04-21 NOTE — Progress Notes (Signed)
Patient ID: LETZY GULLICKSON, female   DOB: January 20, 1966, 49 y.o.   MRN: [id number]  Grace Rumps, MD Phone: [phone]  Grace Shepard is a 49 y.o. female who presents today for new patient visit.  Fibromyalgia: patient reports long history of pain in her bilateral thighs, shoulders, and arms. She notes that these areas get sharp and tender pain. This is intermittent pain. She notes some fatigue with this as well. She reports her prior physician was managing this. She has been on gabapentin and a number of other medications with little benefit. She notes that citalopram helped some. Has been taking vitamins and nutrients to help with this. Notes discomfort improves as she gets going throughout the day. 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   Chest pain: is right sided. Is sharp and sometimes aches. No pressure. Notes she burps with this and it improves with burping initially. She notes 3 weeks ago she had an episode with no bupring and then one week ago she had an episode of similar pain and tightness. She had mild dyspnea with this. Notes the pain went away within 6-7 minutes, then notes that her right arm started to hurt for a few minutes. No diaphoresis with this. No palpitations. No history of HTN or DM. Notes her cholesterol has been high in the past though not recently. No history of VTE. Has history of GERD and notes she gets burpy and a sour taste with GERD, though this was different. Her grandfather died at age 5 of MI. No CP or dyspnea at this time.   History of Migraines: no headache at this time. Has history of this associated with right sided facial numbness in the past. Has been evaluated for stroke previously and advised she did not have a stroke. Has aura and photophobia with her migraines. Has followed with Dr Grace Shepard for this. Notes 2 headaches in the past month. No weakness or numbness at this time. Notes no numbness in her face in several months.   Light headedness: notes that  this has been an issue for several weeks. Notes she gets light headed on rising from laying or sitting. Improves on sitting down. No CP, dyspnea, or palpitations with these episodes. No vertigo. No vision changes. Notes she is thirsty some of the time.   Memory issues: patient notes that she has had trouble getting lost in the past several months. Has occurred on 5 occassions. Denies depression. No other memory issues.   Active Ambulatory Problems    Diagnosis Date Noted  . Fibromyalgia 04/21/2015  . Chest pain 04/21/2015  . History of migraine 04/21/2015  . Light headedness 04/21/2015  . Memory problem 04/21/2015   Resolved Ambulatory Problems    Diagnosis Date Noted  . No Resolved Ambulatory Problems   Past Medical History  Diagnosis Date  . Thyroid disease   . Ischemic colitis   . Arthritis   . Depression   . Allergic rhinitis   . Hypertension   . Hyperlipidemia   . Migraine   . UTI (lower urinary tract infection)     Family History  Problem Relation Age of Onset  . Alcoholism      uncle  . Drug abuse Sister   . Rheum arthritis Father   . Osteoarthritis Mother   . Breast cancer Maternal Aunt   . Hyperlipidemia      parent  . Stroke Mother   . Heart attack Maternal Grandfather   . Diabetes Brother   .  Congestive Heart Failure Father     Social History   Social History  . Marital Status: Married    Spouse Name: Grace Shepard  . Number of Children: Grace Shepard  . Years of Education: Grace Shepard   Occupational History  . Not on file.   Social History Main Topics  . Smoking status: Never Smoker   . Smokeless tobacco: Never Used  . Alcohol Use: No  . Drug Use: No  . Sexual Activity: Not on file   Other Topics Concern  . Not on file   Social History Narrative    ROS   Review of Symptoms  General:  Negative for nexplained weight loss, fever Skin: Negative for new or changing mole, sore that won't heal HEENT: Negative for trouble hearing, trouble seeing, ringing in ears,  mouth sores, hoarseness, change in voice, dysphagia. CV: positive for chest pain, Negative for dyspnea, edema, palpitations Resp: Negative for cough, dyspnea, hemoptysis GI: Positive for diarrhea and constipation (chronic issues for years), Negative for nausea, vomiting, abdominal pain, melena, hematochezia. GU: Negative for dysuria, incontinence, urinary hesitance, hematuria, vaginal or penile discharge, polyuria, sexual difficulty, lumps in testicle or breasts MSK: Positive for muscle cramps or aches, Negative for joint pain or swelling Neuro: Negative for headaches, weakness, numbness, dizziness, passing out/fainting Psych: Positive for memory problems, Negative for depression, anxiety Endo: positive for occasional excessive thirst  Objective  Physical Exam Filed Vitals:   04/19/15 1500  BP: 116/78  Pulse: 71  Temp:   lying P71 Bp 116/78 Sitting P76 BP 118/82 Standing P79 BP 124/86  Physical Exam  Constitutional: She is well-developed, well-nourished, and in no distress.  HENT:  Head: Normocephalic.  Right Ear: External ear normal.  Left Ear: External ear normal.  Mouth/Throat: Oropharynx is clear and moist. No oropharyngeal exudate.  Normal TMs bilaterally  Eyes: Conjunctivae are normal. Pupils are equal, round, and reactive to light.  Neck: Neck supple. No thyromegaly present.  Cardiovascular: Normal rate, regular rhythm and normal heart sounds.  Exam reveals no gallop and no friction rub.   No murmur heard. No carotid bruits  Pulmonary/Chest: Effort normal and breath sounds normal. No respiratory distress. She has no wheezes. She has no rales.  Abdominal: Soft. Bowel sounds are normal. She exhibits no distension. There is no tenderness. There is no rebound and no guarding.  Musculoskeletal:  Bilateral lateral thighs with mild tenderness over the IT band and the trochanteric bursa, no swelling or masses, full ROM bilateral hips with no pain No neck tenderness or swelling,  full ROM  Lymphadenopathy:    She has no cervical adenopathy.  Neurological: She is alert.  CN 2-12 intact, 5/5 strength in bilateral biceps, triceps, grip, quads, hamstrings, plantar and dorsiflexion, sensation to light touch intact in bilateral UE and LE, normal gait, 2+ patellar and brachioradialis reflexes  Skin: She is not diaphoretic.   EKG: NSR, rate 71, t-wave inverted lead III nonspecific, no other ST or T wave changes noted from previous EKG  Assessment/Plan:   Fibromyalgia Patient reports long history of this with pain scattered throughout her body. On exam today she has discomfort on palpation of her thighs that could be due to IT band syndrome or trochanteric bursitis. This could also account for her chest pain as well (see CP problem for discussion). Discussed referral to sports med for eval of this, though patient declined stating she would like to wait for me to receive records from prior physician to determine what has been tried thus far.  Will continue to monitor at this time. Given return precautions.   Chest pain Chest pain over the past 3-4 months atypical in location, type of pain, and lack of diaphoresis and non-exertional. Did radiate and have SOB with last episode. EKG reassuring today. Could be cardiac, though atypical pain makes this less likely. Doubt PE given normal HR and O2 sat with no history of PE. Normal lung exam and O2 sat makes pulmonary process unlikely. Could aslo be related to her fibromyalgia. No active CP. Will refer to cardiology for further evaluation. Will check CBC, CMET, and TSH. Given return precautions.   History of migraine Has history of migraine. No HA at this time. Neurologically intact. Continue to monitor for recurrence. Will continue to follow with neurology.   Light headedness Description of this is most consistent with orthostasis, though had negative orthostatics today. Could be related to cardiac cause, though find this less likely  given lack of other symptoms. Advised to stay well hydrated. Will have patient see cardiology for her chest pain and have evaluation for cardiac cause of this. She does note some excessive thirst at times and could be an indication of dehydration. Will check CBC, TSH, CMET, and UA. Given return precautions.   Memory problem Reports issue of getting lost on several occasions. Will check TSH and B12 today. Will have patient return for MMSE at next visit.     Orders Placed This Encounter  Procedures  . Comp Met (CMET)  . TSH  . CBC  . B12  . Urine Microscopic Only  . Ambulatory referral to Cardiology    Referral Priority:  Routine    Referral Type:  Consultation    Referral Reason:  Specialty Services Required    Requested Specialty:  Cardiology    Number of Visits Requested:  1  . POCT Urinalysis Dipstick  . EKG 12-Lead    Grace Shepard

## 2015-04-25 ENCOUNTER — Other Ambulatory Visit: Payer: Self-pay | Admitting: Family Medicine

## 2015-04-25 DIAGNOSIS — R8290 Unspecified abnormal findings in urine: Secondary | ICD-10-CM

## 2015-04-26 ENCOUNTER — Other Ambulatory Visit (INDEPENDENT_AMBULATORY_CARE_PROVIDER_SITE_OTHER)

## 2015-04-26 DIAGNOSIS — R8290 Unspecified abnormal findings in urine: Secondary | ICD-10-CM | POA: Diagnosis not present

## 2015-04-26 LAB — POCT URINALYSIS DIPSTICK
BILIRUBIN UA: NEGATIVE
Glucose, UA: NEGATIVE
KETONES UA: NEGATIVE
LEUKOCYTES UA: NEGATIVE
NITRITE UA: NEGATIVE
PROTEIN UA: NEGATIVE
Spec Grav, UA: 1.02
Urobilinogen, UA: 0.2
pH, UA: 6

## 2015-04-26 LAB — URINALYSIS, MICROSCOPIC ONLY: RBC / HPF: NONE SEEN (ref 0–?)

## 2015-05-19 ENCOUNTER — Encounter (INDEPENDENT_AMBULATORY_CARE_PROVIDER_SITE_OTHER): Payer: Self-pay

## 2015-05-19 ENCOUNTER — Ambulatory Visit (INDEPENDENT_AMBULATORY_CARE_PROVIDER_SITE_OTHER): Admitting: Family Medicine

## 2015-05-19 VITALS — BP 108/64 | HR 84 | Temp 98.4°F | Ht 59.06 in | Wt 132.0 lb

## 2015-05-19 DIAGNOSIS — M797 Fibromyalgia: Secondary | ICD-10-CM

## 2015-05-19 DIAGNOSIS — R200 Anesthesia of skin: Secondary | ICD-10-CM

## 2015-05-19 DIAGNOSIS — G4700 Insomnia, unspecified: Secondary | ICD-10-CM

## 2015-05-19 DIAGNOSIS — J309 Allergic rhinitis, unspecified: Secondary | ICD-10-CM | POA: Diagnosis not present

## 2015-05-19 MED ORDER — MONTELUKAST SODIUM 10 MG PO TABS
10.0000 mg | ORAL_TABLET | Freq: Every day | ORAL | Status: DC
Start: 2015-05-19 — End: 2016-08-09

## 2015-05-19 MED ORDER — ZOLPIDEM TARTRATE ER 6.25 MG PO TBCR: 6.2500 mg | EXTENDED_RELEASE_TABLET | Freq: Every evening | ORAL | Status: DC | PRN

## 2015-05-19 NOTE — Progress Notes (Signed)
Pre visit review using our clinic review tool, if applicable. No additional management support is needed unless otherwise documented below in the visit note. 

## 2015-05-19 NOTE — Patient Instructions (Signed)
Nice to see you. We will change you to ambien CR for sleep issues.  I refilled your singulair.  We will refer you back to neurology.  If you have weakness, numbness, headaches, nausea, vomiting, vision changes, cough, shortness of breath, fatigue, or drowsiness please seek medical attention.

## 2015-05-22 ENCOUNTER — Encounter: Payer: Self-pay | Admitting: Family Medicine

## 2015-05-22 DIAGNOSIS — J309 Allergic rhinitis, unspecified: Secondary | ICD-10-CM | POA: Insufficient documentation

## 2015-05-22 DIAGNOSIS — G4700 Insomnia, unspecified: Secondary | ICD-10-CM | POA: Insufficient documentation

## 2015-05-22 DIAGNOSIS — J302 Other seasonal allergic rhinitis: Secondary | ICD-10-CM | POA: Insufficient documentation

## 2015-05-22 NOTE — Assessment & Plan Note (Signed)
Well controlled. Will refill singulair.

## 2015-05-22 NOTE — Assessment & Plan Note (Signed)
Again the distribution of most of her pain seems consistent with IT band syndrome or trochanteric bursitis. Discussed this again with her and offered sports med referral for possible injection and further evaluation, though she declined this. She does report intermittent numbness in her extremities which is concerning for a neurologic process. No symptoms at this time. Has been going on for a year. Neurologically intact at this time. Will refer to neurology for this. Given return precautions.

## 2015-05-22 NOTE — Progress Notes (Signed)
Patient ID: LUCCA GREGGS, female   DOB: [birth year], 49 y.o.   MRN: [id number]  Grace Rumps, MD Phone: [phone]  Grace Shepard is a 49 y.o. female who presents today for f/u.  Insomnia: notes this has been an issue for a number of years. Has been taking ambien 10 mg daily for this. Notes if she does not take the [place] she only sleeps a couple hours. If she takes [place] she sleeps all night. Notes she snores. No apnea. States she has had a sleep study that was negative. Notes even with the ambien she wakes up 2-3x/night.  Allergies: notes intermittently gets itchy and watery eyes and rhinorrhea. Notes she has asthma as well. Rarely uses albuterol. Takes singulair and zyrtec for allergies. Notes no allergy symptoms if she takes these medications.   Fibromyalgia: notes her bilateral hips and legs bother her the most. She notes some low back pain with this as well. Hips bother her the most when laying on her side. No injuries. No weakness. Notes intermittent numbness that occurs sporadically. Can occur in RUE, LUE, RLE, and LLE though never occurs in multiple extremities at one time. Lasts up to 5 minutes at a time. Occuring for the past year. Recently saw optho and was advised her vision was fine. No incontinence, saddle anesthesia, fever, or history of cancer. Takes celexa for fibromyalgia. Has not seen neuro in some time. No headaches at this time.   PMH: nonsmoker.   ROS see HPI  Objective  Physical Exam Filed Vitals:   05/19/15 1409  BP: 108/64  Pulse: 84  Temp: 98.4 F (36.9 C)    Physical Exam  Constitutional: She is well-developed, well-nourished, and in no distress.  HENT:  Head: Normocephalic and atraumatic.  Right Ear: External ear normal.  Left Ear: External ear normal.  Mouth/Throat: Oropharynx is clear and moist.  Eyes: Conjunctivae are normal. Pupils are equal, round, and reactive to light.  Neck: Neck supple.  Cardiovascular: Normal rate, regular rhythm and  normal heart sounds.  Exam reveals no gallop and no friction rub.   No murmur heard. Pulmonary/Chest: Effort normal and breath sounds normal. No respiratory distress. She has no wheezes. She has no rales.  Musculoskeletal:  No midline spine tenderness, no back tenderness, no back swelling, has mild tenderness over IT band and greater trochanter bilaterally, full hip ROM with no pain  Lymphadenopathy:    She has no cervical adenopathy.  Neurological: She is alert. Gait normal.  CN 2-12 intact, 5/5 strength in bilateral biceps, triceps, grip, quads, hamstrings, plantar and dorsiflexion, sensation to light touch intact in bilateral UE and LE, normal gait, 2+ patellar reflexes  Skin: Skin is warm and dry. She is not diaphoretic.     Assessment/Plan: Please see individual problem list.  Fibromyalgia Again the distribution of most of her pain seems consistent with IT band syndrome or trochanteric bursitis. Discussed this again with her and offered sports med referral for possible injection and further evaluation, though she declined this. She does report intermittent numbness in her extremities which is concerning for a neurologic process. No symptoms at this time. Has been going on for a year. Neurologically intact at this time. Will refer to neurology for this. Given return precautions.  Allergic rhinitis Well controlled. Will refill singulair.  Insomnia Seems to be doing ok with this, though is taking above the recommended amount for a female in 10 mg daily. Also is waking up several times per night. Will change to  ambien CR at 6.25 mg dose and see if this proves beneficial for her intermittent waking up at night.     Orders Placed This Encounter  Procedures  . Ambulatory referral to Neurology    Referral Priority:  Routine    Referral Type:  Consultation    Referral Reason:  Specialty Services Required    Requested Specialty:  Neurology    Number of Visits Requested:  1    Grace Shepard

## 2015-05-22 NOTE — Assessment & Plan Note (Signed)
Seems to be doing ok with this, though is taking above the recommended amount for a female in 10 mg daily. Also is waking up several times per night. Will change to ambien CR at 6.25 mg dose and see if this proves beneficial for her intermittent waking up at night.

## 2015-05-24 ENCOUNTER — Telehealth: Payer: Self-pay | Admitting: Family Medicine

## 2015-05-24 NOTE — Telephone Encounter (Signed)
Attempted to call patient. There was no answer. Left VM asking her to call back to the office.

## 2015-05-28 ENCOUNTER — Emergency Department

## 2015-05-28 ENCOUNTER — Encounter: Payer: Self-pay | Admitting: Radiology

## 2015-05-28 ENCOUNTER — Emergency Department
Admission: EM | Admit: 2015-05-28 | Discharge: 2015-05-29 | Disposition: A | Discharge status: Home / Self Care | Attending: Emergency Medicine | Admitting: Emergency Medicine

## 2015-05-28 DIAGNOSIS — A047 Enterocolitis due to Clostridium difficile: Secondary | ICD-10-CM | POA: Diagnosis not present

## 2015-05-28 DIAGNOSIS — A0472 Enterocolitis due to Clostridium difficile, not specified as recurrent: Secondary | ICD-10-CM

## 2015-05-28 DIAGNOSIS — Z3202 Encounter for pregnancy test, result negative: Secondary | ICD-10-CM | POA: Diagnosis not present

## 2015-05-28 DIAGNOSIS — I10 Essential (primary) hypertension: Secondary | ICD-10-CM | POA: Diagnosis not present

## 2015-05-28 DIAGNOSIS — R1084 Generalized abdominal pain: Secondary | ICD-10-CM

## 2015-05-28 DIAGNOSIS — R109 Unspecified abdominal pain: Secondary | ICD-10-CM | POA: Diagnosis present

## 2015-05-28 LAB — COMPREHENSIVE METABOLIC PANEL
ALBUMIN: 4.5 g/dL (ref 3.5–5.0)
ALT: 16 U/L (ref 14–54)
ANION GAP: 6 (ref 5–15)
AST: 21 U/L (ref 15–41)
Alkaline Phosphatase: 92 U/L (ref 38–126)
BUN: 14 mg/dL (ref 6–20)
CHLORIDE: 108 mmol/L (ref 101–111)
CO2: 23 mmol/L (ref 22–32)
Calcium: 9.6 mg/dL (ref 8.9–10.3)
Creatinine, Ser: 0.84 mg/dL (ref 0.44–1.00)
Glucose, Bld: 90 mg/dL (ref 65–99)
POTASSIUM: 3.5 mmol/L (ref 3.5–5.1)
SODIUM: 137 mmol/L (ref 135–145)
TOTAL PROTEIN: 7.8 g/dL (ref 6.5–8.1)
Total Bilirubin: 0.5 mg/dL (ref 0.3–1.2)

## 2015-05-28 LAB — URINALYSIS COMPLETE WITH MICROSCOPIC (ARMC ONLY)
BACTERIA UA: NONE SEEN
BILIRUBIN URINE: NEGATIVE
GLUCOSE, UA: NEGATIVE mg/dL
Ketones, ur: NEGATIVE mg/dL
LEUKOCYTES UA: NEGATIVE
NITRITE: NEGATIVE
PH: 5 (ref 5.0–8.0)
Protein, ur: NEGATIVE mg/dL
SPECIFIC GRAVITY, URINE: 1.016 (ref 1.005–1.030)

## 2015-05-28 LAB — PREGNANCY, URINE: PREG TEST UR: NEGATIVE

## 2015-05-28 LAB — CBC
HEMATOCRIT: 43.3 % (ref 35.0–47.0)
Hemoglobin: 14.8 g/dL (ref 12.0–16.0)
MCH: 30.2 pg (ref 26.0–34.0)
MCHC: 34.1 g/dL (ref 32.0–36.0)
MCV: 88.4 fL (ref 80.0–100.0)
Platelets: 248 10*3/uL (ref 150–440)
RBC: 4.89 MIL/uL (ref 3.80–5.20)
RDW: 13 % (ref 11.5–14.5)
WBC: 10.5 10*3/uL (ref 3.6–11.0)

## 2015-05-28 MED ORDER — LORAZEPAM 2 MG/ML IJ SOLN
1.0000 mg | Freq: Once | INTRAMUSCULAR | Status: DC
  Filled 2015-05-28: qty 1

## 2015-05-28 MED ORDER — MORPHINE SULFATE (PF) 4 MG/ML IV SOLN
4.0000 mg | Freq: Once | INTRAVENOUS | Status: AC
  Administered 2015-05-28: 4 mg via INTRAVENOUS
  Filled 2015-05-28: qty 1

## 2015-05-28 MED ORDER — IOHEXOL 240 MG/ML SOLN
25.0000 mL | INTRAMUSCULAR | Status: AC
  Administered 2015-05-28 (×2): 25 mL via ORAL

## 2015-05-28 MED ORDER — SODIUM CHLORIDE 0.9 % IV SOLN
Freq: Once | INTRAVENOUS | Status: AC
  Administered 2015-05-28: 22:00:00 via INTRAVENOUS

## 2015-05-28 MED ORDER — ONDANSETRON HCL 4 MG/2ML IJ SOLN
4.0000 mg | Freq: Once | INTRAMUSCULAR | Status: AC
  Administered 2015-05-28: 4 mg via INTRAVENOUS
  Filled 2015-05-28: qty 2

## 2015-05-28 MED ORDER — IOHEXOL 300 MG/ML  SOLN
100.0000 mL | Freq: Once | INTRAMUSCULAR | Status: AC | PRN
  Administered 2015-05-28: 100 mL via INTRAVENOUS

## 2015-05-28 MED ORDER — LORAZEPAM 2 MG/ML IJ SOLN
INTRAMUSCULAR | Status: AC
  Filled 2015-05-28: qty 1

## 2015-05-28 NOTE — ED Provider Notes (Signed)
[facility] Emergency Department Provider Note     Time seen: ----------------------------------------- 9:20 PM on 05/28/2015 -----------------------------------------    I have reviewed the triage vital signs and the nursing notes.   HISTORY  Chief Complaint Abdominal Pain and Diarrhea    HPI Grace Shepard is a 49 y.o. female who presents ER for abdominal pain for greater than 4 days. Patient states since today she is having reddish colored mucousy diarrhea. 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 Patient reporting similar symptoms to the colitis. She has having dull left sided abdominal pain, nothing makes it better or worse. She denies any fevers, but has had chills, one episode of nausea as well.   Past Medical History  Diagnosis Date  . Thyroid disease   . Ischemic colitis ([place])   . Arthritis   . Depression   . Allergic rhinitis   . Hypertension   . Hyperlipidemia   . Migraine   . UTI (lower urinary tract infection)     Patient Active Problem List   Diagnosis Date Noted  . Allergic rhinitis 05/22/2015  . Insomnia 05/22/2015  . Fibromyalgia 04/21/2015  . Chest pain 04/21/2015  . History of migraine 04/21/2015  . Light headedness 04/21/2015  . Memory problem 04/21/2015    Past Surgical History  Procedure Laterality Date  . Tubal ligation      Allergies Review of patient's allergies indicates no known allergies.  Social History Social History  Substance Use Topics  . Smoking status: Never Smoker   . Smokeless tobacco: Never Used  . Alcohol Use: No    Review of Systems Constitutional: Negative for fever. Eyes: Negative for visual changes. ENT: Negative for sore throat. Cardiovascular: Negative for chest pain. Respiratory: Negative for shortness of breath. Gastrointestinal: Positive for left-sided abdominal pain, nausea and diarrhea Genitourinary: Negative for  dysuria. Musculoskeletal: Negative for back pain. Skin: Negative for rash. Neurological: Negative for headaches, focal weakness or numbness.  10-point ROS otherwise negative.  ____________________________________________   PHYSICAL EXAM:  VITAL SIGNS: ED Triage Vitals  Enc Vitals Group     BP 05/28/15 2100 143/94 mmHg     Pulse Rate 05/28/15 2100 73     Resp 05/28/15 2100 14     Temp 05/28/15 2100 98.2 F (36.8 C)     Temp Source 05/28/15 2100 Oral     SpO2 05/28/15 2100 100 %     Weight 05/28/15 2100 130 lb (58.968 kg)     Height 05/28/15 2100 4\' 11"  (1.499 m)     Head Cir --      Peak Flow --      Pain Score 05/28/15 2101 2     Pain Loc --      Pain Edu? --      Excl. in [place]? --     Constitutional: Alert and oriented. Well appearing and in no distress. Eyes: Conjunctivae are normal. PERRL. Normal extraocular movements. ENT   Head: Normocephalic and atraumatic.   Nose: No congestion/rhinnorhea.   Mouth/Throat: Mucous membranes are moist.   Neck: No stridor. Cardiovascular: Normal rate, regular rhythm. Normal and symmetric distal pulses are present in all extremities. No murmurs, rubs, or gallops. Respiratory: Normal respiratory effort without tachypnea nor retractions. Breath sounds are clear and equal bilaterally. No wheezes/rales/rhonchi. Gastrointestinal: Soft with mild left-sided abdominal tenderness, no rebound or guarding. Normal bowel sounds.  Musculoskeletal: Nontender with normal range of motion in all extremities. No joint effusions.  No lower extremity tenderness nor edema. Neurologic:  Normal speech and language. No gross focal neurologic deficits are appreciated. Speech is normal. No gait instability. Skin:  Skin is warm, dry and intact. No rash noted. Psychiatric: Mood and affect are normal. Speech and behavior are normal. Patient exhibits appropriate insight and judgment. ____________________________________________  ED COURSE:  Pertinent  labs & imaging results that were available during my care of the patient were reviewed by me and considered in my medical decision making (see chart for details). We'll obtain abdominal labs, CT imaging and stool studies. ____________________________________________    LABS (pertinent positives/negatives)  Labs Reviewed  URINALYSIS COMPLETEWITH MICROSCOPIC ([place] ONLY) - Abnormal; Notable for the following:    Color, Urine YELLOW (*)    APPearance CLEAR (*)    Hgb urine dipstick 2+ (*)    Squamous Epithelial / LPF 0-5 (*)    All other components within normal limits  C DIFFICILE QUICK SCREEN W PCR REFLEX  COMPREHENSIVE METABOLIC PANEL  CBC  PREGNANCY, URINE  OCCULT BLOOD X 1 CARD TO LAB, STOOL    RADIOLOGY  CT of abdomen and pelvis with contrast is pending  ____________________________________________  FINAL ASSESSMENT AND PLAN  Abdominal pain and diarrhea  Plan: Patient with labs and imaging as dictated above. Her labs to this point are unremarkable, CT is pending and will be checked out to Dr. Beather Arbour for follow-up.   Earleen Newport, MD   Earleen Newport, MD 05/28/15 [phone]

## 2015-05-28 NOTE — ED Notes (Signed)
Patient transported to CT 

## 2015-05-28 NOTE — ED Notes (Signed)
No ativan given, pt calmed with verbal reassurance and guided through ct scan by ct technician.

## 2015-05-28 NOTE — ED Notes (Signed)
Pt states has had abdominal pain for greater than 4 days. Pt states since today is having rust colored mucus diarrhea. Pt was diagnosed with ischemic colitis last year. Pt states symptoms are similar to diagnosis of colitis last year.

## 2015-05-28 NOTE — ED Notes (Signed)
Call received from ct technician. Ct tech states pt is very anxious regarding ct scan. Ct tech requesting "something to help calm her down." dr. Beather Arbour notified.

## 2015-05-28 NOTE — ED Provider Notes (Signed)
-----------------------------------------   11:43 PM on 05/28/2015 -----------------------------------------  Patient requested anxiolytic in order to obtain CT scan. IV Ativan ordered.  ----------------------------------------- 1:01 AM on 05/29/2015 -----------------------------------------  CT abdomen and pelvis interpreted per Dr. Marisue Humble: 1. Minimal thickening involving the splenic flexure of the colon which may reflect recurrent colitis. This is less pronounced than that on prior exam. 2. Unchanged size and appearance of the 7 mm right renal lesion, again with possible delayed enhancement. As mentioned previously, a renal protocol MRI could be performed for characterization.  Updated patient and spouse of CT results. Patient just produced loose stool sample which will be sent to lab for analysis. Patient will wait for C. difficile results tonight prior to discharge. Otherwise she is resting comfortably in no acute distress. Complains of residual pain for which I will order analgesics.   ----------------------------------------- 4:07 AM on 05/29/2015 -----------------------------------------  Apologized for delay. Patient is positive for C. Difficile. Given that patient's symptoms are mild, she is afebrile with normal white count, I will treat with Flagyl (as opposed to oral vancomycin) and patient will follow-up with her PCP this week. Strict return precautions given. Patient and spouse verbalized understanding and agree with plan of care.    Paulette Blanch, MD 05/29/15 [phone]

## 2015-05-29 LAB — C DIFFICILE QUICK SCREEN W PCR REFLEX
C DIFFICILE (CDIFF) TOXIN: NEGATIVE
C Diff antigen: POSITIVE — AB

## 2015-05-29 LAB — CLOSTRIDIUM DIFFICILE BY PCR: CDIFFPCR: POSITIVE — AB

## 2015-05-29 LAB — OCCULT BLOOD X 1 CARD TO LAB, STOOL: Fecal Occult Bld: POSITIVE — AB

## 2015-05-29 MED ORDER — METRONIDAZOLE 500 MG PO TABS
500.0000 mg | ORAL_TABLET | Freq: Once | ORAL | Status: AC
  Administered 2015-05-29: 500 mg via ORAL
  Filled 2015-05-29: qty 1

## 2015-05-29 MED ORDER — ONDANSETRON HCL 4 MG PO TABS: 4.0000 mg | ORAL_TABLET | Freq: Three times a day (TID) | ORAL | Status: DC | PRN

## 2015-05-29 MED ORDER — OXYCODONE-ACETAMINOPHEN 5-325 MG PO TABS: 1.0000 | ORAL_TABLET | ORAL | Status: DC | PRN

## 2015-05-29 MED ORDER — ONDANSETRON 4 MG PO TBDP
4.0000 mg | ORAL_TABLET | Freq: Once | ORAL | Status: AC
  Administered 2015-05-29: 4 mg via ORAL
  Filled 2015-05-29: qty 1

## 2015-05-29 MED ORDER — METRONIDAZOLE 500 MG PO TABS: 500.0000 mg | ORAL_TABLET | Freq: Three times a day (TID) | ORAL | Status: DC

## 2015-05-29 MED ORDER — OXYCODONE-ACETAMINOPHEN 5-325 MG PO TABS
1.0000 | ORAL_TABLET | Freq: Once | ORAL | Status: AC
  Administered 2015-05-29: 1 via ORAL
  Filled 2015-05-29: qty 1

## 2015-05-29 NOTE — ED Notes (Signed)
Pt updated on c.diff result turn around time. Pt verbalizes understanding.

## 2015-05-29 NOTE — ED Notes (Signed)
Pt updated on progress of c diff results. Pt verbalizes understanding.

## 2015-05-29 NOTE — ED Notes (Signed)
Bed readjusted for comfort. Call bell remains at side. Vss.

## 2015-05-29 NOTE — ED Notes (Signed)
Pt up to restroom to void. Pt's spouse updated on ct progress.

## 2015-05-29 NOTE — Discharge Instructions (Signed)
1. Take antibiotic as prescribed (Flagyl 500 mg 3 times daily 14 days). 2. You may take pain and nausea medicine as needed (Percocet/Zofran #15). 3. Return to the ER for worsening symptoms, persistent vomiting, fever, worsening pain or other concerns.  Abdominal Pain, Adult Many things can cause abdominal pain. Usually, abdominal pain is not caused by a disease and will improve without treatment. It can often be observed and treated at home. Your health care provider will do a physical exam and possibly order blood tests and X-rays to help determine the seriousness of your pain. However, in many cases, more time must pass before a clear cause of the pain can be found. Before that point, your health care provider may not know if you need more testing or further treatment. HOME CARE INSTRUCTIONS Monitor your abdominal pain for any changes. The following actions may help to alleviate any discomfort you are experiencing:  Only take over-the-counter or prescription medicines as directed by your health care provider.  Do not take laxatives unless directed to do so by your health care provider.  Try a clear liquid diet (broth, tea, or water) as directed by your health care provider. Slowly move to a bland diet as tolerated. SEEK MEDICAL CARE IF:  You have unexplained abdominal pain.  You have abdominal pain associated with nausea or diarrhea.  You have pain when you urinate or have a bowel movement.  You experience abdominal pain that wakes you in the night.  You have abdominal pain that is worsened or improved by eating food.  You have abdominal pain that is worsened with eating fatty foods.  You have a fever. SEEK IMMEDIATE MEDICAL CARE IF:  Your pain does not go away within 2 hours.  You keep throwing up (vomiting).  Your pain is felt only in portions of the abdomen, such as the right side or the left lower portion of the abdomen.  You pass bloody or black tarry stools. MAKE SURE  YOU:  Understand these instructions.  Will watch your condition.  Will get help right away if you are not doing well or get worse.   This information is not intended to replace advice given to you by your health care provider. Make sure you discuss any questions you have with your health care provider.   Document Released: 05/08/2005 Document Revised: 04/19/2015 Document Reviewed: 04/07/2013 Elsevier Interactive Patient Education 2016 Elsevier Inc.  Clostridium Difficile Infection Clostridium difficile (C. difficile or C. diff) is a bacterium normally found in the intestinal tract or colon. C. difficile infection causes diarrhea and sometimes a severe disease called pseudomembranous colitis (C. difficile colitis). C. difficile colitis can damage the lining of the colon or cause the colon to become very large (toxic megacolon). Older adults and people with certain medical conditions have a greater risk of getting C. difficile infections. CAUSES The balance of bacteria in your colon can change when you are sick, especially when taking antibiotic medicine. Taking antibiotics may allow the C. difficile to grow, multiply, and make a toxin that causes C. difficile infection.  SYMPTOMS  Diarrhea.  Fever.  Fatigue.  Loss of appetite.  Nausea.  Abdominal swelling, pain, or tenderness.  Dehydration. DIAGNOSIS Your health care provider may suspect C. difficile infection based on your symptoms and if you have taken antibiotics recently. Your health care provider may also order:  A lab test that can detect the toxin in your stool.  A sigmoidoscopy or colonoscopy to look at the appearance of your  colon. These procedures involve passing an instrument through your rectum to look at the inside of your colon. Your health care provider will help determine if these tests are necessary. TREATMENT Treatment may include:  Taking antibiotics that keep C. difficile from growing.  Stopping the  antibiotics you were on before the C. difficile infection began. Only do this if instructed to do so by your health care provider.  IV fluids and correction of electrolyte imbalance.  Surgery to remove the infected part of the intestines. This is rare. HOME CARE INSTRUCTIONS  Drink enough fluids to keep your urine clear or pale yellow. Avoid milk, caffeine, and alcohol.  Ask your health care provider for specific rehydration instructions.  Eat small, frequent meals rather than large meals.  Take your antibiotics as directed. Finish them even if you start to feel better.  Do not use medicines to slow diarrhea. This could delay healing or cause problems.  Wash your hands thoroughly after using the bathroom and before preparing food. Make sure people who live with you wash their hands often, too.  Clean all surfaces with a product that contains chlorine bleach. SEEK MEDICAL CARE IF:  Your diarrhea lasts longer than expected or comes back after you finish your antibiotic medicine for the C. difficile infection.  You have trouble staying hydrated.  You have a fever. SEEK IMMEDIATE MEDICAL CARE IF:  You have increasing abdominal pain or tenderness.  You have blood in your stools, or your stools look dark black and tarry.  You cannot eat or drink without vomiting.   This information is not intended to replace advice given to you by your health care provider. Make sure you discuss any questions you have with your health care provider.   Document Released: 05/08/2005 Document Revised: 08/19/2014 Document Reviewed: 01/30/2015 Elsevier Interactive Patient Education [employer].

## 2015-05-29 NOTE — ED Notes (Signed)
Pt up to restroom to provide stool sample

## 2015-05-30 ENCOUNTER — Ambulatory Visit: Admitting: Family Medicine

## 2015-05-31 NOTE — Telephone Encounter (Signed)
Patient has an appointment this Friday. We'll speak with her at that time regarding her contraceptive pills.

## 2015-06-02 ENCOUNTER — Ambulatory Visit (INDEPENDENT_AMBULATORY_CARE_PROVIDER_SITE_OTHER): Admitting: Family Medicine

## 2015-06-02 ENCOUNTER — Encounter: Payer: Self-pay | Admitting: Family Medicine

## 2015-06-02 VITALS — BP 112/76 | HR 76 | Temp 97.6°F | Ht 59.6 in | Wt 128.6 lb

## 2015-06-02 DIAGNOSIS — A047 Enterocolitis due to Clostridium difficile: Secondary | ICD-10-CM

## 2015-06-02 DIAGNOSIS — A0472 Enterocolitis due to Clostridium difficile, not specified as recurrent: Secondary | ICD-10-CM

## 2015-06-02 DIAGNOSIS — M545 Low back pain, unspecified: Secondary | ICD-10-CM

## 2015-06-02 DIAGNOSIS — G8929 Other chronic pain: Secondary | ICD-10-CM | POA: Insufficient documentation

## 2015-06-02 NOTE — Progress Notes (Signed)
Patient ID: Grace Shepard, female   DOB: July 25, 1966, 49 y.o.   MRN: [id number]  [name], MD Phone: [phone]  Grace Shepard is a 49 y.o. female who presents today for follow-up.  C. difficile colitis: Patient was evaluated in the emergency room on Sunday for left-sided abdominal pain and was found to have mild thickening of the colon at the splenic flexure on CT scan and subsequently had a positive C. difficile test. She was felt to have a mild case at that time with no elevation in her white count and stable vital signs. She was placed on Flagyl in the ED. She did have a positive FOBT at that time. She reports she feels much improved at this time. She has had decreasing amounts of left-sided abdominal pain and they are very infrequent at this time. She doesn't have any diarrhea now though her stools are still soft. She has been eating bland diet. No vomiting. No urinary issues. Has not noted any blood in her stool. She does note minimal nausea that has improved.  Low back pain: Patient has a long history of low back pain. She notes this has been aggravated over the last 2 days. Does not radiate down the back of her legs. She did note that it hurt just prior to having a bowel movement and this improved with having a bowel movement. She has no numbness, weakness, tingling, fevers, saddle anesthesia, bowel or bladder incontinence, or history of cancer. She does not remember an injury to her back. She notes her back has improved at this time. She did take a Percocet yesterday for this. States she has never had imaging of her back  PMH: nonsmoker.   ROS see history of present illness  Objective  Physical Exam Filed Vitals:   06/02/15 1417  BP: 112/76  Pulse: 76  Temp: 97.6 F (36.4 C)    Physical Exam  Constitutional: She is well-developed, well-nourished, and in no distress.  HENT:  Head: Normocephalic and atraumatic.  Right Ear: External ear normal.  Left Ear: External ear  normal.  Cardiovascular: Normal rate, regular rhythm and normal heart sounds.  Exam reveals no gallop and no friction rub.   No murmur heard. Pulmonary/Chest: Effort normal and breath sounds normal. No respiratory distress. She has no wheezes. She has no rales.  Abdominal: Soft. Bowel sounds are normal. She exhibits no distension. There is no tenderness. There is no rebound and no guarding.  Musculoskeletal:  No midline spine tenderness or step off, she has bilateral lumbar paraspinous muscle and other lumbar muscular tenderness, there is no swelling, there is no overlying skin changes  Neurological: She is alert.  5 out of 5 strength in bilateral quads, hamstrings, plantar flexion, and dorsiflexion, sensation to light touch intact in bilateral lower extremities, 2+ patellar reflexes  Skin: Skin is warm and dry. She is not diaphoretic.     Assessment/Plan: Please see individual problem list.  C. difficile colitis Diagnosed in the ED this past Sunday. She now she is much improved at this time with only infrequent abdominal pain. She has no abdominal pain today and her abdominal exam is benign. Her stools have improved as well. Her vitals are stable and she is afebrile. She'll continue Flagyl. She'll follow-up with [place] on October 31 after she has completed the Flagyl. Advised on hygiene. She is given return precautions.  Low back pain She reports long history of intermittent low back pain. Has had a recent flare of this. She  is neurologically intact. She has no red flags. Given chronicity of intermittent back pain we'll obtain x-ray to rule out arthritic changes and refer her to physical therapy. This could be related to her fibromyalgia. It is unlikely to be related to a vascular issue given normal blood pressure and normal pedal pulses. She will take Tylenol 1000 mg every 8 hours as needed for pain. She questioned whether this could be taken with her Ambien at night and I noted after reviewing  up-to-date there is no known interaction. I did discuss that both medications are metabolized by the liver and that if taken in excess they could cause liver damage. Advised patient to limit Tylenol use to less than 3 g daily. If she has to use Tylenol for prolonged periods of time she will inform us. Given return precautions.    Orders Placed This Encounter  Procedures  . DG Lumbar Spine Complete    Standing Status: Future     Number of Occurrences:      Standing Expiration Date: 08/01/2016    Order Specific Question:  Reason for Exam (SYMPTOM  OR DIAGNOSIS REQUIRED)    Answer:  low back pain    Order Specific Question:  Is the patient pregnant?    Answer:  No    Order Specific Question:  Preferred imaging location?    Answer:  [facility]  . Ambulatory referral to Physical Therapy    Referral Priority:  Routine    Referral Type:  Physical Medicine    Referral Reason:  Specialty Services Required    Requested Specialty:  Physical Therapy    Number of Visits Requested:  1    [name]

## 2015-06-02 NOTE — Progress Notes (Signed)
Pre visit review using our clinic review tool, if applicable. No additional management support is needed unless otherwise documented below in the visit note. 

## 2015-06-02 NOTE — Assessment & Plan Note (Signed)
Diagnosed in the ED this past Sunday. She now she is much improved at this time with only infrequent abdominal pain. She has no abdominal pain today and her abdominal exam is benign. Her stools have improved as well. Her vitals are stable and she is afebrile. She'll continue Flagyl. She'll follow-up with [place] on October 31 after she has completed the Flagyl. Advised on hygiene. She is given return precautions.

## 2015-06-02 NOTE — Assessment & Plan Note (Signed)
She reports long history of intermittent low back pain. Has had a recent flare of this. She is neurologically intact. She has no red flags. Given chronicity of intermittent back pain we'll obtain x-ray to rule out arthritic changes and refer her to physical therapy. This could be related to her fibromyalgia. It is unlikely to be related to a vascular issue given normal blood pressure and normal pedal pulses. She will take Tylenol 1000 mg every 8 hours as needed for pain. She questioned whether this could be taken with her Ambien at night and I noted after reviewing up-to-date there is no known interaction. I did discuss that both medications are metabolized by the liver and that if taken in excess they could cause liver damage. Advised patient to limit Tylenol use to less than 3 g daily. If she has to use Tylenol for prolonged periods of time she will inform us. Given return precautions.

## 2015-06-02 NOTE — Patient Instructions (Signed)
Nice to see you. I am glad you are feeling better.  Please obtain the x-ray of your back. We will also refer you to physical therapy for this.  Please finish the course of flagyl.  If you develop worsening back pain, or abdominal pain, nausea, vomiting, diarrhea, blood in your stool, numbness, weakness, loss of bowel or bladder function, fever, or feel poorly or any new symptoms please seek medical attention.

## 2015-06-06 ENCOUNTER — Ambulatory Visit: Admitting: Physical Therapy

## 2015-06-08 ENCOUNTER — Ambulatory Visit: Admitting: Physical Therapy

## 2015-06-08 ENCOUNTER — Ambulatory Visit (INDEPENDENT_AMBULATORY_CARE_PROVIDER_SITE_OTHER)
Admission: RE | Admit: 2015-06-08 | Discharge: 2015-06-08 | Disposition: A | Discharge status: Home / Self Care | Source: Ambulatory Visit | Attending: Family Medicine | Admitting: Family Medicine

## 2015-06-08 DIAGNOSIS — M545 Low back pain, unspecified: Secondary | ICD-10-CM

## 2015-06-09 ENCOUNTER — Encounter: Payer: Self-pay | Admitting: Family Medicine

## 2015-06-12 ENCOUNTER — Ambulatory Visit: Admitting: Family Medicine

## 2015-06-13 ENCOUNTER — Encounter: Admitting: Physical Therapy

## 2015-06-14 ENCOUNTER — Ambulatory Visit

## 2015-06-15 ENCOUNTER — Encounter: Admitting: Physical Therapy

## 2015-06-15 ENCOUNTER — Ambulatory Visit (INDEPENDENT_AMBULATORY_CARE_PROVIDER_SITE_OTHER): Admitting: Family Medicine

## 2015-06-15 ENCOUNTER — Encounter: Payer: Self-pay | Admitting: Family Medicine

## 2015-06-15 VITALS — BP 106/74 | HR 86 | Temp 98.1°F | Ht 59.6 in | Wt 128.2 lb

## 2015-06-15 DIAGNOSIS — M545 Low back pain, unspecified: Secondary | ICD-10-CM

## 2015-06-15 DIAGNOSIS — R1013 Epigastric pain: Secondary | ICD-10-CM | POA: Diagnosis not present

## 2015-06-15 DIAGNOSIS — G8929 Other chronic pain: Secondary | ICD-10-CM

## 2015-06-15 DIAGNOSIS — A047 Enterocolitis due to Clostridium difficile: Secondary | ICD-10-CM | POA: Diagnosis not present

## 2015-06-15 DIAGNOSIS — K219 Gastro-esophageal reflux disease without esophagitis: Secondary | ICD-10-CM | POA: Insufficient documentation

## 2015-06-15 DIAGNOSIS — A0472 Enterocolitis due to Clostridium difficile, not specified as recurrent: Secondary | ICD-10-CM

## 2015-06-15 LAB — POCT URINE PREGNANCY: PREG TEST UR: NEGATIVE

## 2015-06-15 MED ORDER — PANTOPRAZOLE SODIUM 40 MG PO TBEC: 40.0000 mg | DELAYED_RELEASE_TABLET | Freq: Every day | ORAL | Status: DC

## 2015-06-15 NOTE — Assessment & Plan Note (Signed)
Improved. Is resolved. Per review of care everywhere she had negative test of cure. We will continue to monitor for recurrence.

## 2015-06-15 NOTE — Assessment & Plan Note (Addendum)
Patient's chronic intermittent epigastric discomfort. She reports no abdominal discomfort at this time, other than when I press on her epigastric region. She has a benign abdominal exam otherwise. No peritoneal signs. She states afebrile. She had a negative urine pregnancy test. No urinary, pelvic, or vaginal complaints. Recent CT scan of her abdomen and pelvis did reveal a 7 mm lesion on her right kidney that the patient reports has been worked up by urology previously. There is also an area of fatty infiltration in her liver. No apparent gastric changes. Suspect likely gastritis or ulcer given chronicity and location, though we'll check a lipase and CMP to rule out gallbladder and pancreatic issues. We'll start her on Protonix daily. We will also check a H. pylori antibody. We will continue to monitor this. She was given return precautions.

## 2015-06-15 NOTE — Progress Notes (Signed)
Patient ID: Grace Shepard, female   DOB: 1965-12-03, 49 y.o.   MRN: [id number]  Grace Rumps, Grace Shepard Phone: [phone]  Grace Shepard is a 49 y.o. female who presents today for follow-up.  C. difficile colitis: Patient notes symptoms have resolved. She saw GI on Monday and they did a test of cure at that time that appears to be negative. She finished her Flagyl. She does note some belching. She's been having daily normal bowel movements. She denies abdominal pain at this time.  Low back pain: Patient notes this initially improved following her last visit. She got to the point where she stopped using a heating pad. Then she noted over the last several days some twinges in her right low back. She denies radiation down her legs. She denies numbness, weakness, fever, saddle anesthesia, bowel and bladder incontinence, and history of cancer. We discussed her x-ray results.  Epigastric discomfort, chronic intermittent: While doing the patient's abdominal exam she did note some minimal tenderness in her epigastric region. She notes this is been a chronic intermittent issue for years. She had this prior to having a colitis. She notes no nausea, vomiting, or diarrhea with this. She notes no blood in her stool. She notes in the past this is gotten better with Zantac. She notes it can occur 5-6 times a week and is often associated with food though can happen at other times as well. She does have a history of irritable bowel syndrome. She has seen GI in the past, though reports she's never had an EGD. She denies right upper quadrant pain and lower abdominal pain. She denies dysuria, urinary frequency, and urinary urgency. She denies any vaginal discharge. LMP was 05/08/15. She's had a tubal ligation. She has no abdominal discomfort at this time. She does not take NSAIDs, she does not drink alcohol, she does not smoke cigarettes.  PMH: nonsmoker.   ROS see history of present illness  Objective  Physical  Exam Filed Vitals:   06/15/15 1448  BP: 106/74  Pulse: 86  Temp: 98.1 F (36.7 C)    Physical Exam  Constitutional: She is well-developed, well-nourished, and in no distress.  HENT:  Head: Normocephalic and atraumatic.  Cardiovascular: Normal rate, regular rhythm and normal heart sounds.  Exam reveals no gallop and no friction rub.   No murmur heard. Pulmonary/Chest: Effort normal and breath sounds normal. No respiratory distress. She has no wheezes. She has no rales.  Abdominal:  Soft, normoactive bowel sounds, nondistended, minimal tenderness in the epigastric region, no tenderness elsewhere, no tenderness at McBurney's point, no masses palpated, no guarding or rebound  Musculoskeletal: She exhibits no edema.  No midline spine tenderness or step off, no swelling or erythema of the back, there is minimal right lumbar paraspinous muscle tenderness intermittently on exam, there is no spasm  Neurological: She is alert.  5/5 strength in bilateral quads, hamstrings, plantar and dorsiflexion, sensation to light touch intact in bilateral LE, normal gait, 2+ patellar reflexes  Skin: Skin is warm and dry. She is not diaphoretic.     Assessment/Plan: Please see individual problem list.  C. difficile colitis Improved. Is resolved. Per review of care everywhere she had negative test of cure. We will continue to monitor for recurrence.  Low back pain Discussed results of lumbar spine films. Neurologically intact. No red flags. Advised on possible causes of back pain including muscle strain and DDD. Patient will proceed with physical therapy. Tylenol for discomfort. If she has a flare  she'll follow-up in the office. Given return precautions.  Abdominal pain, chronic, epigastric Patient's chronic intermittent epigastric discomfort. She reports no abdominal discomfort at this time, other than when I press on her epigastric region. She has a benign abdominal exam otherwise. No peritoneal signs.  She states afebrile. She had a negative urine pregnancy test. No urinary, pelvic, or vaginal complaints. Recent CT scan of her abdomen and pelvis did reveal a 7 mm lesion on her right kidney that the patient reports has been worked up by urology previously. There is also an area of fatty infiltration in her liver. No apparent gastric changes. Suspect likely gastritis or ulcer given chronicity and location, though we'll check a lipase and CMP to rule out gallbladder and pancreatic issues. We'll start her on Protonix daily. We will also check a H. pylori antibody. We will continue to monitor this. She was given return precautions.    Orders Placed This Encounter  Procedures  . Comp Met (CMET)  . Lipase  . H. pylori antibody, IgG  . POCT urine pregnancy    Meds ordered this encounter  Medications  . pantoprazole (PROTONIX) 40 MG tablet    Sig: Take 1 tablet (40 mg total) by mouth daily.    Dispense:  30 tablet    Refill:  3   Grace Shepard

## 2015-06-15 NOTE — Assessment & Plan Note (Signed)
Discussed results of lumbar spine films. Neurologically intact. No red flags. Advised on possible causes of back pain including muscle strain and DDD. Patient will proceed with physical therapy. Tylenol for discomfort. If she has a flare she'll follow-up in the office. Given return precautions.

## 2015-06-15 NOTE — Progress Notes (Signed)
Pre visit review using our clinic review tool, if applicable. No additional management support is needed unless otherwise documented below in the visit note. 

## 2015-06-15 NOTE — Patient Instructions (Addendum)
Nice to see you. We will check lab work today. Please follow-up with PT. If you develop abdominal pain, vomiting, diarrhea, blood in her stool, constipation, fever, worsening back pain, numbness, weakness, loss of bowel or bladder function, or any new symptoms please seek medical attention immediately.

## 2015-06-16 ENCOUNTER — Other Ambulatory Visit: Payer: Self-pay | Admitting: Family Medicine

## 2015-06-16 ENCOUNTER — Telehealth: Payer: Self-pay | Admitting: *Deleted

## 2015-06-16 LAB — COMPREHENSIVE METABOLIC PANEL
ALT: 24 U/L (ref 0–35)
AST: 23 U/L (ref 0–37)
Albumin: 4.3 g/dL (ref 3.5–5.2)
Alkaline Phosphatase: 62 U/L (ref 39–117)
BILIRUBIN TOTAL: 0.3 mg/dL (ref 0.2–1.2)
BUN: 12 mg/dL (ref 6–23)
CALCIUM: 9.8 mg/dL (ref 8.4–10.5)
CHLORIDE: 102 meq/L (ref 96–112)
CO2: 29 meq/L (ref 19–32)
CREATININE: 0.74 mg/dL (ref 0.40–1.20)
GFR: 89.32 mL/min (ref >60.00)
GLUCOSE: 62 mg/dL — AB (ref 70–99)
Potassium: 4.2 mEq/L (ref 3.5–5.1)
Sodium: 137 mEq/L (ref 135–145)
Total Protein: 7.2 g/dL (ref 6.0–8.3)

## 2015-06-16 LAB — LIPASE: Lipase: 24 U/L (ref 11.0–59.0)

## 2015-06-16 LAB — H. PYLORI ANTIBODY, IGG: H Pylori IgG: NEGATIVE

## 2015-06-16 MED ORDER — ZOLPIDEM TARTRATE ER 6.25 MG PO TBCR: 6.2500 mg | EXTENDED_RELEASE_TABLET | Freq: Every evening | ORAL | Status: DC | PRN

## 2015-06-16 NOTE — Telephone Encounter (Signed)
Patient was concerned about her medication for Protonix, and Ambien  that was called in to the pharmacy. Patient stated that pharmacy did not have the medication.

## 2015-06-19 ENCOUNTER — Ambulatory Visit: Attending: Family Medicine

## 2015-06-19 ENCOUNTER — Other Ambulatory Visit: Payer: Self-pay

## 2015-06-19 ENCOUNTER — Encounter

## 2015-06-19 DIAGNOSIS — M545 Low back pain, unspecified: Secondary | ICD-10-CM

## 2015-06-19 DIAGNOSIS — R531 Weakness: Secondary | ICD-10-CM | POA: Insufficient documentation

## 2015-06-19 MED ORDER — PANTOPRAZOLE SODIUM 40 MG PO TBEC: 40.0000 mg | DELAYED_RELEASE_TABLET | Freq: Every day | ORAL | Status: DC

## 2015-06-19 NOTE — Patient Instructions (Signed)
Knee Roll    Lying on back, with knees bent and feet flat on floor/bed, arms outstretched to sides, slowly roll both knees to the right side, hold 5 seconds. Back to starting position, hold 5 seconds.  Repeat 10 times. Do 3 sets daily.  Copyright  VHI. All rights reserved.

## 2015-06-19 NOTE — Telephone Encounter (Signed)
Spoke with the patient she got the prescription for Ambien already at [facility], can we call the Protonix in to [facility] also.  Refilled the script at [facility] on [address].

## 2015-06-19 NOTE — Therapy (Signed)
[facility] [address] [place], [address] Phone: [phone]   Fax:  [phone]  Physical Therapy Evaluation  Patient Details  Name: Grace Shepard MRN: [id number] Date of Birth: May 27, 1966 Referring Provider: Leone Haven, MD  Encounter Date: 06/19/2015      PT End of Session - 06/19/15 1357    Visit Number 1   Number of Visits 9   Date for PT Re-Evaluation 07/20/15   PT Start Time 2197   PT Stop Time 1508   PT Time Calculation (min) 71 min   Activity Tolerance Patient tolerated treatment well   Behavior During Therapy [facility] for tasks assessed/performed      Past Medical History  Diagnosis Date  . Thyroid disease   . Ischemic colitis ([place])   . Arthritis   . Depression   . Allergic rhinitis   . Hyperlipidemia   . Migraine   . UTI (lower urinary tract infection)     Past Surgical History  Procedure Laterality Date  . Tubal ligation      There were no vitals filed for this visit.  Visit Diagnosis:  Bilateral low back pain without sciatica - Plan: PT plan of care cert/re-cert  Bilateral low back pain, with sciatica presence unspecified - Plan: PT plan of care cert/re-cert  Weakness - Plan: PT plan of care cert/re-cert      Subjective Assessment - 06/19/15 1404    Subjective back pain: current 1/10 (pt currently sitting with R leg crossed over her L), best 0/10 (walking for short distances but not long distances), 8/10 at worst (laying on her side, pt starts of on her R side, then switches to her L side; laying down on her stomach also irritates her back)   Pertinent History Bilateral low back pain which began about 1 year ago. Pt states that her back pain will wake her up at night. During the daytime, aggravating factors include the movement of sitting, sitting for prolonged periods, getting up from lying down and trying to get up from the lying down position.  No unexplained changes in  weight, No bowel or bladder problems. Pt states feeling tingling and numbness in her face (neurologist told her that it was due to migraines). Pt also adds that her R posteriolateral thigh > L feels numbness at times. Numbness does not go past her knees. Pt adds that her back pain began gradually which continues to worsen.  Pt also stopped using her heating pad at night for her back and her back started to feel better in the morning.    Diagnostic tests Pt states recently having x-rays for her back which revealed minimal arthritis, otherwise fine.    Patient Stated Goals "Be able to do the day to day stuff and not hurt."   Currently in Pain? Yes   Pain Score 1    Pain Location Back   Pain Orientation Lower   Pain Descriptors / Indicators Burning;Constant;Aching   Pain Type Chronic pain   Pain Radiating Towards Bilateral thighs not past knees   Pain Onset More than a month ago   Aggravating Factors  transitional movements, R and L S/L, prone position.    Pain Relieving Factors short distance walking   Multiple Pain Sites Yes  Low back, bilateral thighs     Objectives: Manual therapy: Prone P to A to R sacral ala and R L5 transverse process grade 3,   Decreased back pain in  prone position afterwards.   There-ex  - Directed patient with supine lower trunk rotation 2x 5 seconds to the L, and 10x5 seconds to the R. Symptoms increased with L lower trunk rotation. No symptoms with R lower trunk rotation. Reviewed and given as part of her HEP. Pt demonstrated and verbalized understanding.        [facility] PT Assessment - 06/19/15 1416    Assessment   Medical Diagnosis Bilateral low back pain without sciatica   Referring Provider Leone Haven, MD   Onset Date/Surgical Date 06/02/15   Next MD Visit In one month   Prior Therapy No known physical therapy for current condition.   Precautions   Precaution Comments No known precautions   Restrictions   Other Position/Activity Restrictions No  known restrictions   Balance Screen   Has the patient fallen in the past 6 months No   Has the patient had a decrease in activity level because of a fear of falling?  No   Is the patient reluctant to leave their home because of a fear of falling?  No   Prior Function   Vocation Other (comment)  Homemaker   Vocation Requirements PLOF: better able to lie down on her side, do chores. Would have some difficulty due to fibromyalgia.    Observation/Other Assessments   Observations (-) Repeated flexion test. (+) long sit test suggesting anterior nutation of L innominate.    Modified Oswertry 38 % Moderate disability   Posture/Postural Control   Posture Comments Bilaterally protracted shoulder, R scapular anterior tipping, R greater trochanter higher than the R,    AROM   Overall AROM Comments SI (Sacroiliac joint)   Lumbar Flexion Full  R SI joint discomfort when returning to neutral positon   Lumbar Extension WFL with R SI discomfort    Lumbar - Right Side Bend WFL with L SI pulling  reproduction of symptoms   Lumbar - Left Side Bend WFL with R SI pulling   Reproduction of symptoms   Lumbar - Right Rotation WFL   Lumbar - Left Rotation WFL   Strength   Right Hip Flexion 4/5   Right Hip Extension 4-/5   Right Hip ABduction 4/5  with reproduction of R lateral thigh pain   Left Hip Flexion 4/5   Left Hip Extension 4-/5   Left Hip External Rotation --   Left Hip ABduction 4+/5   Palpation   Palpation comment L ASIS more anterior compared to R. TTP bilateral ASIS. TTP R scaral Ala (reproduction of back pain), R L4, L3 TP. TTP to lateral sacrum. Stiffness with P to A to R sacral ala, R L 5 transverse process (TP)    Ambulation/Gait   Gait Comments Increased L trunk rotation, decreased R trunk rotation                           PT Education - 06/19/15 1815    Education provided Yes   Education Details thre-ex, HEP   Person(s) Educated Patient   Methods  Explanation;Demonstration;Tactile cues;Verbal cues;Handout   Comprehension Verbalized understanding;Returned demonstration             PT Long Term Goals - 06/19/15 1834    PT LONG TERM GOAL #1   Title Patient will have a decrease in back pain to 4/10 or less at worst to promote ability to tolerate side lying position as well as to promote ability to perform chores at  home.    Time 4   Period Weeks   Status New   PT LONG TERM GOAL #2   Title Patient will improve her Modified Oswestry Low Back Pain Disability Questionnaire by at least 12 % as a demonstration of improved function.   Time 4   Period Weeks   Status New   PT LONG TERM GOAL #3   Title Patient will improve bilateral glute med and max strength by at least 1/2 MMT grade to promote ability to perform chores with less back pain.    Time 4   Period Weeks   Status New               Plan - 06/19/15 1828    Clinical Impression Statement Patient is a 49 year old female who came to physical therapy secondary to low back pain. Pt also presents with bilateral thigh discomfort, bilateral hip weakness, positive special test suggesting lumbopelvic pathology; TTP, low back stiffness, poor posture, and difficulty tolerating side lying positions as well as performing chores, and transitionning from sit <> stand, supine <> sit due to back pain. Patient will beneft from skilled physical therapy intervention to address the aforementioned deficits.    Pt will benefit from skilled therapeutic intervention in order to improve on the following deficits Pain;Decreased strength;Hypomobility   Rehab Potential Good   Clinical Impairments Affecting Rehab Potential Chronicity of condition   PT Frequency 2x / week   PT Duration 4 weeks   PT Treatment/Interventions Manual techniques;Therapeutic exercise;Therapeutic activities;Electrical Stimulation;Patient/family education;Neuromuscular re-education;Ultrasound   PT Next Visit Plan Joint  mobilization to low back and thoracic spine to promote mobility; hip strengthening, scapular strengthening   Consulted and Agree with Plan of Care Patient         Problem List Patient Active Problem List   Diagnosis Date Noted  . Abdominal pain, chronic, epigastric 06/15/2015  . C. difficile colitis 06/02/2015  . Low back pain 06/02/2015  . Allergic rhinitis 05/22/2015  . Insomnia 05/22/2015  . Fibromyalgia 04/21/2015  . Chest pain 04/21/2015  . History of migraine 04/21/2015  . Light headedness 04/21/2015  . Memory problem 04/21/2015   Thank you for your referral.  [name] PT, DPT   06/19/2015, 7:07 PM  [facility] [address] [place], [address] Phone: [phone]   Fax:  [phone]  Name: TYLER ROBIDOUX MRN: [id number] Date of Birth: August 27, 1965

## 2015-06-22 ENCOUNTER — Ambulatory Visit

## 2015-06-22 DIAGNOSIS — M545 Low back pain, unspecified: Secondary | ICD-10-CM

## 2015-06-22 DIAGNOSIS — R531 Weakness: Secondary | ICD-10-CM

## 2015-06-22 NOTE — Therapy (Signed)
[facility] [address], [place], [address] Phone: [phone]   Fax:  [phone]  Physical Therapy Treatment  Patient Details  Name: Grace Shepard MRN: [id number] Date of Birth: February 08, 1966 Referring Provider: Leone Haven, MD  Encounter Date: 06/22/2015      PT End of Session - 06/22/15 1438    Visit Number 2   Number of Visits 9   Date for PT Re-Evaluation 07/20/15   PT Start Time [id number]   PT Stop Time 1518   PT Time Calculation (min) 40 min   Activity Tolerance Patient tolerated treatment well   Behavior During Therapy [facility] for tasks assessed/performed      Past Medical History  Diagnosis Date  . Thyroid disease   . Ischemic colitis ([place])   . Arthritis   . Depression   . Allergic rhinitis   . Hyperlipidemia   . Migraine   . UTI (lower urinary tract infection)     Past Surgical History  Procedure Laterality Date  . Tubal ligation      There were no vitals filed for this visit.  Visit Diagnosis:  Bilateral low back pain without sciatica  Bilateral low back pain, with sciatica presence unspecified  Weakness      Subjective Assessment - 06/22/15 1440    Subjective Back felt good after last session until yesterday when she helped her dad do chores    Pertinent History Bilateral low back pain which began about 1 year ago. Pt states that her back pain will wake her up at night. During the daytime, aggravating factors include the movement of sitting, sitting for prolonged periods, getting up from lying down and trying to get up from the lying down position.  No unexplained changes in weight, No bowel or bladder problems. Pt states feeling tingling and numbness in her face (neurologist told her that it was due to migraines). Pt also adds that her R posteriolateral thigh > L feels numbness at times. Numbness does not go past her knees. Pt adds that her back pain began gradually which  continues to worsen.  Pt also stopped using her heating pad at night for her back and her back started to feel better in the morning.    Diagnostic tests Pt states recently having x-rays for her back which revealed minimal arthritis, otherwise fine.    Patient Stated Goals "Be able to do the day to day stuff and not hurt."   Currently in Pain? Yes   Pain Score 2    Pain Onset More than a month ago      Objectives:   Manual therapy: Prone P to A to R and L sacral ala grade 3,   There-ex:  - Directed patient with seated L hip extension isometrics 10x3 with 5 second holds, Prone glute max/quad set 10x 5 seconds for 2 sets,  Supine L hip extension isometrics in single knee to chest position 5x5 seconds, 3 sets, increasing L knee to chest after each rest to promote posterior nutation of L pelvis.  Supine bridge 10x2 supine lower trunk rotation10x5 seconds to the R, Seated bilateral scapular retraction 10x5 seconds    Improved exercise technique, movement at target joints, use of target muscles after mod verbal, visual, tactile cues.    Improved mobility to low back after manual therapy. 1/10 back pain after session.  PT Education - 06/22/15 1443    Education provided Yes   Education Details ther-ex   [employer]) Educated Patient   Methods Explanation;Demonstration;Tactile cues;Verbal cues   Comprehension Verbalized understanding;Returned demonstration             PT Long Term Goals - 06/19/15 1834    PT LONG TERM GOAL #1   Title Patient will have a decrease in back pain to 4/10 or less at worst to promote ability to tolerate side lying position as well as to promote ability to perform chores at home.    Time 4   Period Weeks   Status New   PT LONG TERM GOAL #2   Title Patient will improve her Modified Oswestry Low Back Pain Disability Questionnaire by at least 12 % as a demonstration of improved function.   Time 4    Period Weeks   Status New   PT LONG TERM GOAL #3   Title Patient will improve bilateral glute med and max strength by at least 1/2 MMT grade to promote ability to perform chores with less back pain.    Time 4   Period Weeks   Status New               Plan - 06/22/15 1443    Clinical Impression Statement Improved mobility to low back after manual therapy. 1/10 back pain after session.    Pt will benefit from skilled therapeutic intervention in order to improve on the following deficits Pain;Decreased strength;Hypomobility   Rehab Potential Good   Clinical Impairments Affecting Rehab Potential Chronicity of condition   PT Frequency 2x / week   PT Duration 4 weeks   PT Treatment/Interventions Manual techniques;Therapeutic exercise;Therapeutic activities;Electrical Stimulation;Patient/family education;Neuromuscular re-education;Ultrasound   PT Next Visit Plan Joint mobilization to low back and thoracic spine to promote mobility; hip strengthening, scapular strengthening   Consulted and Agree with Plan of Care Patient        Problem List Patient Active Problem List   Diagnosis Date Noted  . Abdominal pain, chronic, epigastric 06/15/2015  . C. difficile colitis 06/02/2015  . Low back pain 06/02/2015  . Allergic rhinitis 05/22/2015  . Insomnia 05/22/2015  . Fibromyalgia 04/21/2015  . Chest pain 04/21/2015  . History of migraine 04/21/2015  . Light headedness 04/21/2015  . Memory problem 04/21/2015    Grace Shepard PT, DPT   06/22/2015, 4:08 PM  [facility] [address] [place], [address] Phone: [phone]   Fax:  [phone]  Name: Grace Shepard MRN: [id number] Date of Birth: August 16, 1965

## 2015-06-26 ENCOUNTER — Ambulatory Visit

## 2015-06-26 DIAGNOSIS — M545 Low back pain, unspecified: Secondary | ICD-10-CM

## 2015-06-26 DIAGNOSIS — R531 Weakness: Secondary | ICD-10-CM

## 2015-06-26 NOTE — Therapy (Signed)
[facility] [address] [place], [address] Phone: [phone]   Fax:  [phone]  Physical Therapy Treatment  Patient Details  Name: Grace Shepard MRN: [id number] Date of Birth: 08-12-1966 Referring Provider: Leone Haven, MD  Encounter Date: 06/26/2015      PT End of Session - 06/26/15 1519    Visit Number 3   Number of Visits 9   Date for PT Re-Evaluation 07/20/15   PT Start Time 1519   PT Stop Time 1600   PT Time Calculation (min) 41 min   Activity Tolerance Patient tolerated treatment well   Behavior During Therapy [facility] for tasks assessed/performed      Past Medical History  Diagnosis Date  . Thyroid disease   . Ischemic colitis ([place])   . Arthritis   . Depression   . Allergic rhinitis   . Hyperlipidemia   . Migraine   . UTI (lower urinary tract infection)     Past Surgical History  Procedure Laterality Date  . Tubal ligation      There were no vitals filed for this visit.  Visit Diagnosis:  Bilateral low back pain without sciatica  Bilateral low back pain, with sciatica presence unspecified  Weakness      Subjective Assessment - 06/26/15 1523    Subjective Pt states that her back bothered her after last session. Took a tylenol and back pain did not come back that day. Had back pain on and off but not as bad. No back pain currently.    Pertinent History Bilateral low back pain which began about 1 year ago. Pt states that her back pain will wake her up at night. During the daytime, aggravating factors include the movement of sitting, sitting for prolonged periods, getting up from lying down and trying to get up from the lying down position.  No unexplained changes in weight, No bowel or bladder problems. Pt states feeling tingling and numbness in her face (neurologist told her that it was due to migraines). Pt also adds that her R posteriolateral thigh > L feels numbness at  times. Numbness does not go past her knees. Pt adds that her back pain began gradually which continues to worsen.  Pt also stopped using her heating pad at night for her back and her back started to feel better in the morning.    Diagnostic tests Pt states recently having x-rays for her back which revealed minimal arthritis, otherwise fine.    Patient Stated Goals "Be able to do the day to day stuff and not hurt."   Currently in Pain? No/denies   Pain Score 0-No pain   Pain Onset More than a month ago      Objectives:   There-ex:  Directed patient with prone glute max/quad set 10x 5 seconds for 3 sets,  Prone on elbows x 2 min Supine L hip extension isometrics in single knee to chest position 10x5 seconds, 2 sets, increasing L knee to chest after each rest to promote posterior nutation of L pelvis, Supine bridge 10x3, Standing squats tapping rear end onto chair holding onto TRX strap 10x2 (to promote ability to perform sit <> stand with less back pain) Standing hip flexor stretch 20 seconds for 3 sets each LE,  Standing bilateral scapular retraction resisting red band 10x 5 seconds for 3 sets, Pallof press resisting red band 10x 5 seconds each direction  Improved exercise technique, movement at target joints, use  of target muscles after mod verbal, visual, tactile cues.    No back pain with supine <> sit or sit <> stand today.                           PT Education - 06/26/15 1525    Education provided Yes   Education Details ther-ex   [employer]) Educated Patient   Methods Explanation;Demonstration;Tactile cues;Verbal cues   Comprehension Verbalized understanding;Returned demonstration             PT Long Term Goals - 06/19/15 1834    PT LONG TERM GOAL #1   Title Patient will have a decrease in back pain to 4/10 or less at worst to promote ability to tolerate side lying position as well as to promote ability to perform chores at home.    Time 4    Period Weeks   Status New   PT LONG TERM GOAL #2   Title Patient will improve her Modified Oswestry Low Back Pain Disability Questionnaire by at least 12 % as a demonstration of improved function.   Time 4   Period Weeks   Status New   PT LONG TERM GOAL #3   Title Patient will improve bilateral glute med and max strength by at least 1/2 MMT grade to promote ability to perform chores with less back pain.    Time 4   Period Weeks   Status New               Plan - 06/26/15 1525    Clinical Impression Statement No back pain with supine <> sit or sit <> stand today.  Tolerated session without complain of back pain.   Pt will benefit from skilled therapeutic intervention in order to improve on the following deficits Pain;Decreased strength;Hypomobility   Rehab Potential Good   Clinical Impairments Affecting Rehab Potential Chronicity of condition   PT Frequency 2x / week   PT Duration 4 weeks   PT Treatment/Interventions Manual techniques;Therapeutic exercise;Therapeutic activities;Electrical Stimulation;Patient/family education;Neuromuscular re-education;Ultrasound   PT Next Visit Plan Joint mobilization to low back and thoracic spine to promote mobility; hip strengthening, scapular strengthening   Consulted and Agree with Plan of Care Patient        Problem List Patient Active Problem List   Diagnosis Date Noted  . Abdominal pain, chronic, epigastric 06/15/2015  . C. difficile colitis 06/02/2015  . Low back pain 06/02/2015  . Allergic rhinitis 05/22/2015  . Insomnia 05/22/2015  . Fibromyalgia 04/21/2015  . Chest pain 04/21/2015  . History of migraine 04/21/2015  . Light headedness 04/21/2015  . Memory problem 04/21/2015    Grace Shepard PT, DPT   06/26/2015, 4:03 PM  [place] [address] [place], [address] Phone: [phone]   Fax:  [phone]  Name: Grace Shepard MRN:  [id number] Date of Birth: October 29, 1965

## 2015-06-29 ENCOUNTER — Ambulatory Visit

## 2015-06-29 DIAGNOSIS — M545 Low back pain, unspecified: Secondary | ICD-10-CM

## 2015-06-29 DIAGNOSIS — R531 Weakness: Secondary | ICD-10-CM

## 2015-06-29 NOTE — Patient Instructions (Signed)
    Quadriceps Set (Prone)   With toes supporting lower legs, squeeze rear end muscles and tighten thigh muscles to straighten knees. Hold _5___ seconds. Relax. Repeat __10__ times per set. Do ___3_ sets per session. Do ___1_ sessions per day.  http://orth.exer.us/726   Copyright  VHI. All rights reserved.   

## 2015-06-29 NOTE — Therapy (Signed)
[facility] [address] [place], [address] Phone: [phone]   Fax:  [phone]  Physical Therapy Treatment  Patient Details  Name: Grace Shepard MRN: [id number] Date of Birth: January 18, 1966 Referring Provider: Leone Haven, MD  Encounter Date: 06/29/2015      PT End of Session - 06/29/15 1600    Visit Number 4   Number of Visits 9   Date for PT Re-Evaluation 07/20/15   PT Start Time 1601   PT Stop Time 1645   PT Time Calculation (min) 44 min   Activity Tolerance Patient tolerated treatment well   Behavior During Therapy [facility] for tasks assessed/performed      Past Medical History  Diagnosis Date  . Thyroid disease   . Ischemic colitis ([place])   . Arthritis   . Depression   . Allergic rhinitis   . Hyperlipidemia   . Migraine   . UTI (lower urinary tract infection)     Past Surgical History  Procedure Laterality Date  . Tubal ligation      There were no vitals filed for this visit.  Visit Diagnosis:  Bilateral low back pain without sciatica  Bilateral low back pain, with sciatica presence unspecified  Weakness      Subjective Assessment - 06/29/15 1601    Subjective Back is doing really well. 3/10 twinge low back when sitting down from standing.    Pertinent History Bilateral low back pain which began about 1 year ago. Pt states that her back pain will wake her up at night. During the daytime, aggravating factors include the movement of sitting, sitting for prolonged periods, getting up from lying down and trying to get up from the lying down position.  No unexplained changes in weight, No bowel or bladder problems. Pt states feeling tingling and numbness in her face (neurologist told her that it was due to migraines). Pt also adds that her R posteriolateral thigh > L feels numbness at times. Numbness does not go past her knees. Pt adds that her back pain began gradually which continues  to worsen.  Pt also stopped using her heating pad at night for her back and her back started to feel better in the morning.    Diagnostic tests Pt states recently having x-rays for her back which revealed minimal arthritis, otherwise fine.    Patient Stated Goals "Be able to do the day to day stuff and not hurt."   Currently in Pain? Yes   Pain Score --  see subjective   Pain Onset More than a month ago        Objectives: Manual therapy: Prone P to A to R sacral ala, R L5 and L4 transverse process grade 3- to 3 (improved lumbar mobility afterwards, decreased tenderness)   There-ex:  Recommended for patient not to keep her phone in her R back pocket (decreased R low back pain when sitting from standing)  Directed patient with prone glute max/quad set 10x 5 seconds for 2 sets,  Prone glute max extension 10x2 each LE with PT assist Supine bridge 10x3, Supine L hip extension isometrics in single knee to chest position 5x5 seconds, then 10x5 seconds, 2 sets, increasing L knee to chest after each rest to promote posterior nutation of L pelvis, Standing squats tapping rear end onto chair holding onto TRX strap 10x3 (to promote ability to perform sit <> stand with less back pain) Standing hip flexor stretch 30 seconds  for 3 sets each LE,   Improved exercise technique, movement at target joints, use of target muscles after mod verbal, visual, tactile cues.     Pt was observed to have her cell phone in her R back pocket when she sat down with R low back twinge. Decreased R low back twinge when movement was repeated without cell phone. No back pain with supine <> sit or sit <> stand for the rest of the session. Patient making progress towards goals.                            PT Education - 06/29/15 1617    Education provided Yes   Education Details ther-ex   [employer]) Educated Patient   Methods Explanation;Demonstration;Tactile cues;Verbal cues   Comprehension  Verbalized understanding;Returned demonstration             PT Long Term Goals - 06/19/15 1834    PT LONG TERM GOAL #1   Title Patient will have a decrease in back pain to 4/10 or less at worst to promote ability to tolerate side lying position as well as to promote ability to perform chores at home.    Time 4   Period Weeks   Status New   PT LONG TERM GOAL #2   Title Patient will improve her Modified Oswestry Low Back Pain Disability Questionnaire by at least 12 % as a demonstration of improved function.   Time 4   Period Weeks   Status New   PT LONG TERM GOAL #3   Title Patient will improve bilateral glute med and max strength by at least 1/2 MMT grade to promote ability to perform chores with less back pain.    Time 4   Period Weeks   Status New               Plan - 06/29/15 1617    Clinical Impression Statement Pt was observed to have her cell phone in her R back pocket when she sat down with R low back twinge. Decreased R low back twinge when movement was repeated without cell phone. No back pain with supine <> sit or sit <> stand for the rest of the session. Patient making progress towards goals.    Pt will benefit from skilled therapeutic intervention in order to improve on the following deficits Pain;Decreased strength;Hypomobility   Rehab Potential Good   Clinical Impairments Affecting Rehab Potential Chronicity of condition   PT Frequency 2x / week   PT Duration 4 weeks   PT Treatment/Interventions Manual techniques;Therapeutic exercise;Therapeutic activities;Electrical Stimulation;Patient/family education;Neuromuscular re-education;Ultrasound   PT Next Visit Plan Joint mobilization to low back and thoracic spine to promote mobility; hip strengthening, scapular strengthening   Consulted and Agree with Plan of Care Patient        Problem List Patient Active Problem List   Diagnosis Date Noted  . Abdominal pain, chronic, epigastric 06/15/2015  . C.  difficile colitis 06/02/2015  . Low back pain 06/02/2015  . Allergic rhinitis 05/22/2015  . Insomnia 05/22/2015  . Fibromyalgia 04/21/2015  . Chest pain 04/21/2015  . History of migraine 04/21/2015  . Light headedness 04/21/2015  . Memory problem 04/21/2015    [name] PT, DPT   06/29/2015, 6:59 PM  [place] [place] [facility] [address], [place], [address] Phone: [phone]   Fax:  [phone]  Name: Grace Shepard MRN: [id number] Date  of Birth: March 03, 1966

## 2015-07-03 ENCOUNTER — Ambulatory Visit

## 2015-07-03 DIAGNOSIS — M545 Low back pain, unspecified: Secondary | ICD-10-CM

## 2015-07-03 DIAGNOSIS — R531 Weakness: Secondary | ICD-10-CM

## 2015-07-03 NOTE — Patient Instructions (Signed)
Hip Flexor Stretch - Standing    Right leg behind, foot facing forward, left knee bent to 90. Place hands on wall. Bend right knee directly under hip. Squeeze buttock muscles and push hips forward. Hold position for 30 seconds Repeat __3_ times. Repeat with other leg. Do __3_ times per day.  Copyright  VHI. All rights reserved.

## 2015-07-03 NOTE — Therapy (Signed)
[facility] [address] [place], [address] Phone: [phone]   Fax:  [phone]  Physical Therapy Treatment  Patient Details  Name: Grace Shepard MRN: [id number] Date of Birth: 10/02/1965 Referring Provider: Leone Haven, MD  Encounter Date: 07/03/2015      PT End of Session - 07/03/15 1652    Visit Number 5   Number of Visits 9   Date for PT Re-Evaluation 07/20/15   PT Start Time [id number]   PT Stop Time 1735   PT Time Calculation (min) 43 min   Activity Tolerance Patient tolerated treatment well   Behavior During Therapy [facility] for tasks assessed/performed      Past Medical History  Diagnosis Date  . Thyroid disease   . Ischemic colitis ([place])   . Arthritis   . Depression   . Allergic rhinitis   . Hyperlipidemia   . Migraine   . UTI (lower urinary tract infection)     Past Surgical History  Procedure Laterality Date  . Tubal ligation      There were no vitals filed for this visit.  Visit Diagnosis:  Bilateral low back pain without sciatica  Bilateral low back pain, with sciatica presence unspecified  Weakness      Subjective Assessment - 07/03/15 1652    Subjective Back is doing pretty good today. 0/10 currently. Standing up and sitting down is better for her back. Pt also adds that the prone glute max extension exercise last session reproduced previous R posterior headaches.    Pertinent History Bilateral low back pain which began about 1 year ago. Pt states that her back pain will wake her up at night. During the daytime, aggravating factors include the movement of sitting, sitting for prolonged periods, getting up from lying down and trying to get up from the lying down position.  No unexplained changes in weight, No bowel or bladder problems. Pt states feeling tingling and numbness in her face (neurologist told her that it was due to migraines). Pt also adds that her R  posteriolateral thigh > L feels numbness at times. Numbness does not go past her knees. Pt adds that her back pain began gradually which continues to worsen.  Pt also stopped using her heating pad at night for her back and her back started to feel better in the morning.    Diagnostic tests Pt states recently having x-rays for her back which revealed minimal arthritis, otherwise fine.    Patient Stated Goals "Be able to do the day to day stuff and not hurt."   Currently in Pain? No/denies   Pain Onset More than a month ago   Multiple Pain Sites Yes  back, bilateral thighs      Objectives:  There-ex:   Directed patient with seated chin tucks 10x2 with 5 second holds Supine bridge with green band resisting hip abduction/ER 10x3 Supine L hip extension isometrics in single knee to chest position10x5 seconds, 2 sets, increasing L knee to chest after each rest to promote posterior nutation of L pelvis, Supine self suboccipital release with 2 small balls x 2 min (decreased headache; reviewed and given as part of her HEP; pt demonstrated and verbalized understanding) S/L hip abduction 10x3 each LE Standing hip flexor stretch 30 seconds for 3 sets each LE,  Static mini lunge 10x each LE Standing squats tapping rear end onto chair, no UE assist 10x3    Improved exercise technique, movement at  target joints, use of target muscles after mod verbal, visual, tactile cues.    Slight decrease in R posterior headache with chin tucks. Decreased headache after self suboccipital release exercise. Good glute med muscle use felt with S/L hip abduction exercise. R and L knee discomfort initially with squat related exercises which decreased when cued to not let her knees go past her toes. No back pain throughout session. Patient making progress towards goals in  decreasing back pain.                            PT Education - 07/03/15 1701    Education provided Yes   Education Details  ther-ex   [employer]) Educated Patient   Methods Explanation;Demonstration;Tactile cues;Verbal cues   Comprehension Verbalized understanding;Returned demonstration             PT Long Term Goals - 06/19/15 1834    PT LONG TERM GOAL #1   Title Patient will have a decrease in back pain to 4/10 or less at worst to promote ability to tolerate side lying position as well as to promote ability to perform chores at home.    Time 4   Period Weeks   Status New   PT LONG TERM GOAL #2   Title Patient will improve her Modified Oswestry Low Back Pain Disability Questionnaire by at least 12 % as a demonstration of improved function.   Time 4   Period Weeks   Status New   PT LONG TERM GOAL #3   Title Patient will improve bilateral glute med and max strength by at least 1/2 MMT grade to promote ability to perform chores with less back pain.    Time 4   Period Weeks   Status New               Plan - 07/03/15 1702    Clinical Impression Statement Slight decrease in R posterior headache with chin tucks. Decreased headache after self suboccipital release exercise. Good glute med muscle use felt with S/L hip abduction exercise. R and L knee discomfort initially with squat related exercises which decreased when cued to not let her knees go past her toes. No back pain throughout session. Patient making progress towards goals in  decreasing back pain.     Pt will benefit from skilled therapeutic intervention in order to improve on the following deficits Pain;Decreased strength;Hypomobility   Rehab Potential Good   Clinical Impairments Affecting Rehab Potential Chronicity of condition   PT Frequency 2x / week   PT Duration 4 weeks   PT Treatment/Interventions Manual techniques;Therapeutic exercise;Therapeutic activities;Electrical Stimulation;Patient/family education;Neuromuscular re-education;Ultrasound   PT Next Visit Plan Joint mobilization to low back and thoracic spine to promote mobility;  hip strengthening, scapular strengthening   Consulted and Agree with Plan of Care Patient        Problem List Patient Active Problem List   Diagnosis Date Noted  . Abdominal pain, chronic, epigastric 06/15/2015  . C. difficile colitis 06/02/2015  . Low back pain 06/02/2015  . Allergic rhinitis 05/22/2015  . Insomnia 05/22/2015  . Fibromyalgia 04/21/2015  . Chest pain 04/21/2015  . History of migraine 04/21/2015  . Light headedness 04/21/2015  . Memory problem 04/21/2015    [name] PT, DPT   07/03/2015, 5:41 PM  [place] [facility] [address] [place], [address] Phone: [phone]   Fax:  [phone]  Name:  Grace Shepard MRN: [id number] Date of Birth: 08/18/1965

## 2015-07-05 ENCOUNTER — Ambulatory Visit

## 2015-07-10 ENCOUNTER — Ambulatory Visit

## 2015-07-10 DIAGNOSIS — R531 Weakness: Secondary | ICD-10-CM

## 2015-07-10 DIAGNOSIS — M545 Low back pain, unspecified: Secondary | ICD-10-CM

## 2015-07-10 NOTE — Therapy (Signed)
[facility] [address], [place], [address] Phone: [phone]   Fax:  [phone]  Physical Therapy Treatment  Patient Details  Name: TASHE WANDER MRN: [id number] Date of Birth: 1965-10-04 Referring Provider: Leone Haven, MD  Encounter Date: 07/10/2015      PT End of Session - 07/10/15 1520    Visit Number 6   Number of Visits 9   Date for PT Re-Evaluation 07/20/15   PT Start Time 1520   PT Stop Time 1602   PT Time Calculation (min) 42 min   Activity Tolerance Patient tolerated treatment well   Behavior During Therapy [facility] for tasks assessed/performed      Past Medical History  Diagnosis Date  . Thyroid disease   . Ischemic colitis ([place])   . Arthritis   . Depression   . Allergic rhinitis   . Hyperlipidemia   . Migraine   . UTI (lower urinary tract infection)     Past Surgical History  Procedure Laterality Date  . Tubal ligation      There were no vitals filed for this visit.  Visit Diagnosis:  Bilateral low back pain without sciatica  Bilateral low back pain, with sciatica presence unspecified  Weakness      Subjective Assessment - 07/10/15 1534    Subjective 2/10 R back pain currenlty   Pertinent History Bilateral low back pain which began about 1 year ago. Pt states that her back pain will wake her up at night. During the daytime, aggravating factors include the movement of sitting, sitting for prolonged periods, getting up from lying down and trying to get up from the lying down position.  No unexplained changes in weight, No bowel or bladder problems. Pt states feeling tingling and numbness in her face (neurologist told her that it was due to migraines). Pt also adds that her R posteriolateral thigh > L feels numbness at times. Numbness does not go past her knees. Pt adds that her back pain began gradually which continues to worsen.  Pt also stopped using her heating pad at  night for her back and her back started to feel better in the morning.    Diagnostic tests Pt states recently having x-rays for her back which revealed minimal arthritis, otherwise fine.    Patient Stated Goals "Be able to do the day to day stuff and not hurt."   Currently in Pain? Yes   Pain Score 2    Pain Onset More than a month ago   Multiple Pain Sites No      Objectives:  Manual therapy:  Prone P to A to R sacral ala and R L5 transverse process grade 3 Decreased low back tenderness afterwards   There-ex:   Directed patient with prone hip extension (legs straight) 7x each LE (R shoulder discomfort, decreased with rest) Supine bridge 10x5 seconds  then with green band resisting hip abduction/ER 10x3 R S/L bow and arrows 10x2 with 5 second holds (L neck discomfort, eased with rest) supine R Lower trunk rotation to the R 10x2 with 5 second holds T-band side step resisting red band 32 ft x 2 each direction Standing squats tapping rear end onto chair, no UE assist 10x3    Improved exercise technique, movement at target joints, use of target muscles after mod verbal, visual, tactile cues.    No back pain after manual therapy. Symptom improvement with improved lumbar and sacral mobility in the R  side (P to A). Tolerated session without aggravation of symptoms.                               PT Education - 07/10/15 1550    Education provided Yes   Education Details ther-ex, HEP   Person(s) Educated Patient   Methods Explanation;Demonstration;Tactile cues;Verbal cues;Handout   Comprehension Verbalized understanding;Returned demonstration             PT Long Term Goals - 06/19/15 1834    PT LONG TERM GOAL #1   Title Patient will have a decrease in back pain to 4/10 or less at worst to promote ability to tolerate side lying position as well as to promote ability to perform chores at home.    Time 4   Period Weeks   Status New   PT LONG TERM GOAL  #2   Title Patient will improve her Modified Oswestry Low Back Pain Disability Questionnaire by at least 12 % as a demonstration of improved function.   Time 4   Period Weeks   Status New   PT LONG TERM GOAL #3   Title Patient will improve bilateral glute med and max strength by at least 1/2 MMT grade to promote ability to perform chores with less back pain.    Time 4   Period Weeks   Status New               Plan - 07/10/15 1535    Clinical Impression Statement No back pain after manual therapy. Symptom improvement with improved lumbar and sacral mobility in the R side (P to A). Tolerated session without aggravation of symptoms.    Pt will benefit from skilled therapeutic intervention in order to improve on the following deficits Pain;Decreased strength;Hypomobility   Rehab Potential Good   Clinical Impairments Affecting Rehab Potential Chronicity of condition   PT Frequency 2x / week   PT Duration 4 weeks   PT Treatment/Interventions Manual techniques;Therapeutic exercise;Therapeutic activities;Electrical Stimulation;Patient/family education;Neuromuscular re-education;Ultrasound   PT Next Visit Plan Joint mobilization to low back and thoracic spine to promote mobility; hip strengthening, scapular strengthening   Consulted and Agree with Plan of Care Patient        Problem List Patient Active Problem List   Diagnosis Date Noted  . Abdominal pain, chronic, epigastric 06/15/2015  . C. difficile colitis 06/02/2015  . Low back pain 06/02/2015  . Allergic rhinitis 05/22/2015  . Insomnia 05/22/2015  . Fibromyalgia 04/21/2015  . Chest pain 04/21/2015  . History of migraine 04/21/2015  . Light headedness 04/21/2015  . Memory problem 04/21/2015    [name] PT, DPT   07/10/2015, 7:29 PM  [facility] [address], [place], [address] Phone: [phone]   Fax:  [phone]  Name: JASENIA PEWITT MRN: [id number] Date of Birth: 1966-06-04

## 2015-07-10 NOTE — Patient Instructions (Signed)
Knee Roll    Lying on back, with knees bent and feet flat on floor/bed, arms outstretched to sides, slowly roll both knees to the right side, hold 5 seconds. Back to starting position, hold 5 seconds.  Repeat 10 times. Do 3 sets daily.  Copyright  VHI. All rights reserved.   

## 2015-07-13 ENCOUNTER — Ambulatory Visit

## 2015-07-17 ENCOUNTER — Ambulatory Visit: Admitting: Family Medicine

## 2015-07-17 ENCOUNTER — Ambulatory Visit

## 2015-07-17 DIAGNOSIS — Z0289 Encounter for other administrative examinations: Secondary | ICD-10-CM

## 2015-07-19 ENCOUNTER — Ambulatory Visit (INDEPENDENT_AMBULATORY_CARE_PROVIDER_SITE_OTHER): Admitting: Family Medicine

## 2015-07-19 ENCOUNTER — Encounter: Payer: Self-pay | Admitting: Family Medicine

## 2015-07-19 VITALS — BP 110/72 | HR 85 | Temp 98.1°F | Ht 59.6 in | Wt 127.1 lb

## 2015-07-19 DIAGNOSIS — R200 Anesthesia of skin: Secondary | ICD-10-CM | POA: Insufficient documentation

## 2015-07-19 DIAGNOSIS — G8929 Other chronic pain: Secondary | ICD-10-CM | POA: Diagnosis not present

## 2015-07-19 DIAGNOSIS — R1013 Epigastric pain: Secondary | ICD-10-CM

## 2015-07-19 DIAGNOSIS — Z23 Encounter for immunization: Secondary | ICD-10-CM

## 2015-07-19 MED ORDER — ZOLPIDEM TARTRATE ER 6.25 MG PO TBCR: 6.2500 mg | EXTENDED_RELEASE_TABLET | Freq: Every evening | ORAL | Status: DC | PRN

## 2015-07-19 NOTE — Assessment & Plan Note (Signed)
Well-controlled with Ambien CR. We'll refill this for 3 months.

## 2015-07-19 NOTE — Assessment & Plan Note (Signed)
Much improved, though has had a recent mild exacerbation. She will continue physical therapy. She can take Tylenol for discomfort. Apply heat to the area. She is neurologically intact. No red flags. She is given return precautions.

## 2015-07-19 NOTE — Progress Notes (Signed)
Patient ID: ZAYNA KRANER, female   DOB: 23-Jun-1966, 49 y.o.   MRN: [id number]  [name], MD Phone: [phone]  Grace Shepard is a 49 y.o. female who presents today for follow-up.  Low back pain, chronic: Patient notes physical therapy also significantly. She been doing much better until Saturday evening when she tried to do the exercises at home by herself. She noted some spasm in her paraspinous muscles from her mid thoracic region to her lumbar region. She notes it intermittently bothers her. She has no lower extremity numbness, weakness, saddle anesthesia, bowel or bladder incontinence, fever, or history of cancer.  Numbness: Notes it has improved, though she still intermittently gets numbness in her arms and hands with certain positions. She notes it specifically occurs when she rests her arms on an armrest. No other numbness. No weakness. No other neurological issues. She has a neurology appointment on Monday.  Insomnia: Patient notes this is stable. She takes Ambien nightly. She is still drinking caffeinated drinks. Without the Ambien she does not sleep. With the Ambien she gets 8 hours of sleep. It does not make her drowsy. She needs a refill on Ambien.  GERD: Patient notes this is significantly improved. She has not had reflux since starting the PPI. She denies abdominal pain and chest pain. He is introducing foods back into her diet to see what bothers her. She notes she has not been able to tolerate milk or ice cream she has some diarrhea with this. No diarrhea at this time.  PMH: nonsmoker.   ROS history of present illness  Objective  Physical Exam Filed Vitals:   07/19/15 1326  BP: 110/72  Pulse: 85  Temp: 98.1 F (36.7 C)    Physical Exam  Constitutional: She is well-developed, well-nourished, and in no distress.  HENT:  Head: Normocephalic and atraumatic.  Mouth/Throat: Oropharynx is clear and moist. No oropharyngeal exudate.  Eyes: Conjunctivae are  normal. Pupils are equal, round, and reactive to light.  Cardiovascular: Normal rate, regular rhythm and normal heart sounds.  Exam reveals no gallop and no friction rub.   No murmur heard. Pulmonary/Chest: Effort normal and breath sounds normal. No respiratory distress. She has no wheezes. She has no rales.  Abdominal: Soft. Bowel sounds are normal. She exhibits no distension. There is no tenderness. There is no rebound and no guarding.  Musculoskeletal: She exhibits no edema.  No midline spine tenderness, there is paraspinous muscle mild tenderness from the mid thoracic region to lumbar region, there is no swelling in the back, there is no midline spine step-off  Neurological: She is alert.  CN 2-12 intact, 5/5 strength in bilateral biceps, triceps, grip, quads, hamstrings, plantar and dorsiflexion, sensation to light touch intact in bilateral UE and LE, normal gait, 2+ patellar reflexes  Skin: Skin is warm and dry. She is not diaphoretic.     Assessment/Plan: Please see individual problem list.  GERD (gastroesophageal reflux disease) Symptoms most likely related to GERD or gastritis. Has significantly improved with PPI. Has a benign abdominal exam today. We'll continue the PPI. She was given return precautions.  Low back pain Much improved, though has had a recent mild exacerbation. She will continue physical therapy. She can take Tylenol for discomfort. Apply heat to the area. She is neurologically intact. No red flags. She is given return precautions.  Insomnia Well-controlled with Ambien CR. We'll refill this for 3 months.  Numbness Patient has a history of scattered intermittent numbness in each of her  extremities for number of years. Recently this has been in her bilateral upper extremities and is positional. Resolves quickly with change in position. She is neurologically intact today. She will keep her appointment with neurology on Monday for further evaluation of this. Given return  precautions.    Orders Placed This Encounter  Procedures  . Flu Vaccine QUAD 36+ mos IM    Meds ordered this encounter  Medications  . zolpidem (AMBIEN CR) 6.25 MG CR tablet    Sig: Take 1 tablet (6.25 mg total) by mouth at bedtime as needed for sleep.    Dispense:  30 tablet    Refill:  2    [name]

## 2015-07-19 NOTE — Patient Instructions (Signed)
Nice to see you. Please continue physical therapy. You can use heat and Tylenol for your back. Please keep the follow-up with neurology. Please continue your reflux medicine. Please add in foods as tolerated to determine what upsets her stomach. If you develop abdominal pain, blood in her stool, persistent diarrhea, numbness, weakness, vision changes, worsening back pain, bowel or bladder incontinence, fever, or any new or changing symptoms please seek medical attention.

## 2015-07-19 NOTE — Progress Notes (Signed)
Pre visit review using our clinic review tool, if applicable. No additional management support is needed unless otherwise documented below in the visit note. 

## 2015-07-19 NOTE — Assessment & Plan Note (Signed)
Patient has a history of scattered intermittent numbness in each of her extremities for number of years. Recently this has been in her bilateral upper extremities and is positional. Resolves quickly with change in position. She is neurologically intact today. She will keep her appointment with neurology on Monday for further evaluation of this. Given return precautions.

## 2015-07-19 NOTE — Assessment & Plan Note (Addendum)
Symptoms most likely related to GERD or gastritis. Has significantly improved with PPI. Has a benign abdominal exam today. We'll continue the PPI. She was given return precautions.

## 2015-07-20 ENCOUNTER — Ambulatory Visit

## 2015-07-25 ENCOUNTER — Ambulatory Visit

## 2015-08-25 ENCOUNTER — Telehealth: Payer: Self-pay | Admitting: Family Medicine

## 2015-08-25 MED ORDER — PANTOPRAZOLE SODIUM 40 MG PO TBEC: 40.0000 mg | DELAYED_RELEASE_TABLET | Freq: Every day | ORAL | Status: DC

## 2015-08-25 NOTE — Telephone Encounter (Signed)
Clarified Prescription with patient, [facility], needs a week worth.

## 2015-08-25 NOTE — Telephone Encounter (Signed)
Pt called wanting to know if a couple of her pillspantoprazole (PROTONIX) 40 MG tablet can be called into pharmacy [facility], [place]. Call pt @ [phone]. Thank You!

## 2015-09-29 ENCOUNTER — Telehealth: Payer: Self-pay | Admitting: Family Medicine

## 2015-09-29 ENCOUNTER — Other Ambulatory Visit: Payer: Self-pay

## 2015-09-29 MED ORDER — LEVOTHYROXINE SODIUM 75 MCG PO TABS: 75.0000 ug | ORAL_TABLET | Freq: Every day | ORAL | Status: DC

## 2015-09-29 NOTE — Telephone Encounter (Signed)
Pt called about needing refill for medication levothyroxine (SYNTHROID, LEVOTHROID) 75 MCG tablet. Pt ran out yesterday. Pharmacy is [facility], [place]. Call pt @ [phone]. Thank you!

## 2015-09-29 NOTE — Telephone Encounter (Signed)
Medication filled.  

## 2015-10-11 ENCOUNTER — Other Ambulatory Visit: Payer: Self-pay | Admitting: Family Medicine

## 2015-10-13 ENCOUNTER — Other Ambulatory Visit: Payer: Self-pay

## 2015-10-13 MED ORDER — LEVOTHYROXINE SODIUM 75 MCG PO TABS: 75.0000 ug | ORAL_TABLET | Freq: Every day | ORAL | Status: DC

## 2015-10-24 ENCOUNTER — Other Ambulatory Visit: Payer: Self-pay | Admitting: Family Medicine

## 2015-12-01 ENCOUNTER — Ambulatory Visit: Admitting: Family Medicine

## 2015-12-01 ENCOUNTER — Other Ambulatory Visit: Payer: Self-pay | Admitting: Surgical

## 2015-12-01 MED ORDER — PANTOPRAZOLE SODIUM 40 MG PO TBEC: 40.0000 mg | DELAYED_RELEASE_TABLET | Freq: Every day | ORAL | Status: DC

## 2015-12-04 ENCOUNTER — Encounter: Payer: Self-pay | Admitting: Family Medicine

## 2015-12-04 ENCOUNTER — Ambulatory Visit (INDEPENDENT_AMBULATORY_CARE_PROVIDER_SITE_OTHER): Admitting: Family Medicine

## 2015-12-04 VITALS — BP 110/68 | HR 83 | Temp 98.1°F | Ht 59.6 in | Wt 130.6 lb

## 2015-12-04 DIAGNOSIS — M797 Fibromyalgia: Secondary | ICD-10-CM

## 2015-12-04 DIAGNOSIS — E039 Hypothyroidism, unspecified: Secondary | ICD-10-CM

## 2015-12-04 DIAGNOSIS — R200 Anesthesia of skin: Secondary | ICD-10-CM | POA: Diagnosis not present

## 2015-12-04 DIAGNOSIS — G4700 Insomnia, unspecified: Secondary | ICD-10-CM

## 2015-12-04 DIAGNOSIS — K219 Gastro-esophageal reflux disease without esophagitis: Secondary | ICD-10-CM

## 2015-12-04 MED ORDER — ZOLPIDEM TARTRATE ER 6.25 MG PO TBCR: 6.2500 mg | EXTENDED_RELEASE_TABLET | Freq: Every evening | ORAL | Status: DC | PRN

## 2015-12-04 NOTE — Assessment & Plan Note (Signed)
Continues to be an issue with scattered aches. Discussed potential methods of treatment. Patient opted for continuing Tylenol at this time. She would consider Flexeril or Lyrica and will let us know which one she would like to trial.

## 2015-12-04 NOTE — Progress Notes (Signed)
Patient ID: Grace Shepard, female   DOB: October 20, 1965, 50 y.o.   MRN: [id number]  [name], MD Phone: [phone]  Grace Shepard is a 50 y.o. female who presents today for follow-up.  GERD: Patient notes protonic's helps suppress her symptoms. She notes if she misses a dose she has burning in her throat and sour taste the next day. No bone pain no blood in her stool. She's never had an EGD.  Insomnia: Takes Ambien nightly. He gets up once during the night to use the restroom. She gets 8-9 hours of sleep a night. Wakes well rested. Feels as though the Ambien helps.  HYPOTHYROIDISM Disease Monitoring Weight changes: No  Skin Changes: No Palpitations: No Heat/Cold intolerance: No  Medication Monitoring Compliance:  Taking Synthroid   Last TSH:   Lab Results  Component Value Date   TSH 3.25 04/19/2015   Fibromyalgia: Patient notes she is now taking Tylenol daily for fibromyalgia. She notes this manifests as scattered aches. In the past she has been on gabapentin and amitriptyline and states she had issues taking these medications. She occasionally notes some numbness in the bilateral ulnar aspects of her lower arms. Also notes she has occasionally had numbness in her lower extremities in the past. No weakness. No numbness or weakness at this time. Did not see neurology.  PMH: nonsmoker.   ROS see history of present illness  Objective  Physical Exam Filed Vitals:   12/04/15 1126  BP: 110/68  Pulse: 83  Temp: 98.1 F (36.7 C)    BP Readings from Last 3 Encounters:  12/04/15 110/68  07/19/15 110/72  06/15/15 106/74   Wt Readings from Last 3 Encounters:  12/04/15 130 lb 9.6 oz (59.24 kg)  07/19/15 127 lb 1.9 oz (57.661 kg)  06/15/15 128 lb 3.2 oz (58.151 kg)    Physical Exam  Constitutional: She is well-developed, well-nourished, and in no distress.  HENT:  Head: Normocephalic and atraumatic.  Right Ear: External ear normal.  Left Ear: External ear normal.    Cardiovascular: Normal rate, regular rhythm and normal heart sounds.   Pulmonary/Chest: Effort normal and breath sounds normal.  Abdominal: Soft. Bowel sounds are normal. She exhibits no distension. There is no tenderness. There is no rebound and no guarding.  Musculoskeletal: She exhibits no edema.  Neurological: She is alert.  CN 2-12 intact, 5/5 strength in bilateral biceps, triceps, grip, quads, hamstrings, plantar and dorsiflexion, sensation to light touch intact in bilateral UE and LE, normal gait, 2+ patellar reflexes, positive Tinel's bilateral ulnar grooves with tingling shooting down her arm  Skin: Skin is warm and dry. She is not diaphoretic.     Assessment/Plan: Please see individual problem list.  GERD (gastroesophageal reflux disease) Asymptomatic while taking Protonix though symptoms return sooner she does not take it. She'll continue this medication and we will refer her to GI for consideration of EGD.  Insomnia Refill Ambien.  Numbness Continues to be a recurrent issue. Neurologically intact at this time. Most recently has been more of an issue in her upper extremities in the ulnar distribution in her arms that could be consistent with an ulnar entrapment. I discussed conservative treatment of this with her. Given other scattered numbness in the past I discussed having her follow-up with neurology and she wanted to do this. Referral was placed. She is given return precautions.  Fibromyalgia Continues to be an issue with scattered aches. Discussed potential methods of treatment. Patient opted for continuing Tylenol at this time.  She would consider Flexeril or Lyrica and will let us know which one she would like to trial.  Hypothyroidism Asymptomatic. We'll check a TSH.    Orders Placed This Encounter  Procedures  . TSH    Standing Status: Future     Number of Occurrences:      Standing Expiration Date: 12/03/2016  . Ambulatory referral to Neurology    Referral  Priority:  Routine    Referral Type:  Consultation    Referral Reason:  Specialty Services Required    Requested Specialty:  Neurology    Number of Visits Requested:  1  . Ambulatory referral to Gastroenterology    Referral Priority:  Routine    Referral Type:  Consultation    Referral Reason:  Specialty Services Required    Number of Visits Requested:  1    Meds ordered this encounter  Medications  . zolpidem (AMBIEN CR) 6.25 MG CR tablet    Sig: Take 1 tablet (6.25 mg total) by mouth at bedtime as needed. for sleep    Dispense:  30 tablet    Refill:  2    [name], MD [place]

## 2015-12-04 NOTE — Assessment & Plan Note (Signed)
Asymptomatic. We'll check a TSH.

## 2015-12-04 NOTE — Assessment & Plan Note (Signed)
Continues to be a recurrent issue. Neurologically intact at this time. Most recently has been more of an issue in her upper extremities in the ulnar distribution in her arms that could be consistent with an ulnar entrapment. I discussed conservative treatment of this with her. Given other scattered numbness in the past I discussed having her follow-up with neurology and she wanted to do this. Referral was placed. She is given return precautions.

## 2015-12-04 NOTE — Assessment & Plan Note (Signed)
Refill Ambien

## 2015-12-04 NOTE — Assessment & Plan Note (Signed)
Asymptomatic while taking Protonix though symptoms return sooner she does not take it. She'll continue this medication and we will refer her to GI for consideration of EGD.

## 2015-12-04 NOTE — Progress Notes (Signed)
Pre visit review using our clinic review tool, if applicable. No additional management support is needed unless otherwise documented below in the visit note. 

## 2015-12-04 NOTE — Patient Instructions (Signed)
Nice to see you. We are going to refer you to GI for reflux. We are going to refer you to neurology for your intermittent numbness. I refilled her Ambien. We are going to check your thyroid function today as well. Please consider whether or not you want to try Flexeril or Lyrica for your fibromyalgia. If you develop numbness, weakness, abdominal pain, fever, or any new or changing symptoms please seek medical attention.

## 2015-12-06 ENCOUNTER — Telehealth: Payer: Self-pay | Admitting: *Deleted

## 2015-12-06 MED ORDER — PREGABALIN 75 MG PO CAPS: 75.0000 mg | ORAL_CAPSULE | Freq: Two times a day (BID) | ORAL | Status: DC

## 2015-12-06 NOTE — Telephone Encounter (Signed)
Patient stated that she was advise to call in if she decided to take lyrica. She would like to get the Rx filled. Pharmacy [facility]

## 2015-12-06 NOTE — Telephone Encounter (Signed)
Please advise 

## 2015-12-06 NOTE — Telephone Encounter (Signed)
Prescription completed. It had to be printed out. Please inform patient.

## 2015-12-07 ENCOUNTER — Other Ambulatory Visit

## 2015-12-08 NOTE — Telephone Encounter (Signed)
Per the patient she had side effects with gabapentin and amitriptiyline. I will complete the PA. Thanks

## 2015-12-08 NOTE — Telephone Encounter (Signed)
I believe the patient has tried gabapentin in the past. Can you check with her to see about this. If she has not please see if she is willing to try this first. If she has please complete the prior authorization.

## 2015-12-08 NOTE — Telephone Encounter (Signed)
Received a PA for Lyrica, it states try gabapentin first? Or PA.  Please advise. thanks

## 2015-12-12 NOTE — Telephone Encounter (Signed)
PA completed and approved, 11/12/15-08/11/2098.

## 2015-12-25 ENCOUNTER — Other Ambulatory Visit: Payer: Self-pay | Admitting: Family Medicine

## 2015-12-27 MED ORDER — PANTOPRAZOLE SODIUM 40 MG PO TBEC: 40.0000 mg | DELAYED_RELEASE_TABLET | Freq: Every day | ORAL | Status: DC

## 2015-12-27 NOTE — Addendum Note (Signed)
Addended by: Bevelyn Ngo on: 12/27/2015 04:50 PM   Modules accepted: Orders

## 2016-01-03 ENCOUNTER — Telehealth: Payer: Self-pay | Admitting: *Deleted

## 2016-01-03 NOTE — Telephone Encounter (Signed)
[facility] requested to have the lyrica and zolpidem refilled Fax [phone]

## 2016-01-03 NOTE — Telephone Encounter (Signed)
Both rx request have RF on file.

## 2016-01-10 ENCOUNTER — Telehealth: Payer: Self-pay | Admitting: *Deleted

## 2016-01-10 MED ORDER — ZOLPIDEM TARTRATE ER 6.25 MG PO TBCR: 6.2500 mg | EXTENDED_RELEASE_TABLET | Freq: Every evening | ORAL | Status: DC | PRN

## 2016-01-10 MED ORDER — PREGABALIN 75 MG PO CAPS: 75.0000 mg | ORAL_CAPSULE | Freq: Two times a day (BID) | ORAL | Status: DC

## 2016-01-10 NOTE — Telephone Encounter (Signed)
Refills given. Please fax to patient's pharmacy.

## 2016-01-10 NOTE — Telephone Encounter (Signed)
Refill request   [facility] Fax [phone]  1. Lyrica 75mg  caps 2. Zolpidem tart er 6.25 mg tab  Patient requesting 90 day supply to mail order.  Refills recently sent to local pharmacy.

## 2016-01-10 NOTE — Telephone Encounter (Signed)
Faxed

## 2016-01-10 NOTE — Telephone Encounter (Signed)
Patient would like these to her mail order pharmacy

## 2016-02-02 ENCOUNTER — Encounter: Payer: Self-pay | Admitting: Family Medicine

## 2016-03-04 ENCOUNTER — Ambulatory Visit: Admitting: Family Medicine

## 2016-05-08 ENCOUNTER — Other Ambulatory Visit: Payer: Self-pay | Admitting: Family Medicine

## 2016-05-20 ENCOUNTER — Encounter: Payer: Self-pay | Admitting: Family Medicine

## 2016-05-20 ENCOUNTER — Ambulatory Visit (INDEPENDENT_AMBULATORY_CARE_PROVIDER_SITE_OTHER): Admitting: Family Medicine

## 2016-05-20 VITALS — BP 104/68 | HR 80 | Temp 98.2°F | Wt 130.6 lb

## 2016-05-20 DIAGNOSIS — F329 Major depressive disorder, single episode, unspecified: Secondary | ICD-10-CM | POA: Insufficient documentation

## 2016-05-20 DIAGNOSIS — F32A Depression, unspecified: Secondary | ICD-10-CM

## 2016-05-20 DIAGNOSIS — M797 Fibromyalgia: Secondary | ICD-10-CM

## 2016-05-20 DIAGNOSIS — Z23 Encounter for immunization: Secondary | ICD-10-CM | POA: Diagnosis not present

## 2016-05-20 DIAGNOSIS — F419 Anxiety disorder, unspecified: Secondary | ICD-10-CM | POA: Insufficient documentation

## 2016-05-20 DIAGNOSIS — E039 Hypothyroidism, unspecified: Secondary | ICD-10-CM

## 2016-05-20 DIAGNOSIS — F418 Other specified anxiety disorders: Secondary | ICD-10-CM | POA: Diagnosis not present

## 2016-05-20 DIAGNOSIS — Z8669 Personal history of other diseases of the nervous system and sense organs: Secondary | ICD-10-CM | POA: Diagnosis not present

## 2016-05-20 DIAGNOSIS — F5101 Primary insomnia: Secondary | ICD-10-CM

## 2016-05-20 LAB — TSH: TSH: 0.3 u[IU]/mL — AB (ref 0.35–4.50)

## 2016-05-20 MED ORDER — LEVOTHYROXINE SODIUM 75 MCG PO TABS: 75.0000 ug | ORAL_TABLET | Freq: Every day | ORAL | 3 refills | Status: DC

## 2016-05-20 MED ORDER — ZOLPIDEM TARTRATE ER 6.25 MG PO TBCR: 6.2500 mg | EXTENDED_RELEASE_TABLET | Freq: Every evening | ORAL | 2 refills | Status: DC | PRN

## 2016-05-20 MED ORDER — CITALOPRAM HYDROBROMIDE 40 MG PO TABS: 40.0000 mg | ORAL_TABLET | Freq: Every day | ORAL | 3 refills | Status: DC

## 2016-05-20 NOTE — Assessment & Plan Note (Signed)
Patient with long history of migraines. Occasionally gets them associated with right-sided facial numbness. Prior MRI unremarkable. Reports she was advised by neurology that this was related to her migraines. She is neurologically intact today. Advised to monitor for any new or changing symptoms. She'll seek medical attention if those occur.

## 2016-05-20 NOTE — Assessment & Plan Note (Signed)
Seems to be somewhat improved with Lyrica though does have some fatigue which could be related to fibromyalgia. Lyrica recently increased. She'll see how she does with this.

## 2016-05-20 NOTE — Progress Notes (Signed)
Pre visit review using our clinic review tool, if applicable. No additional management support is needed unless otherwise documented below in the visit note. 

## 2016-05-20 NOTE — Patient Instructions (Signed)
Nice to see you. We will check your thyroid today. I refilled your medications. We will get to see a psychologist in our office. If you develop worsening depression or anxiety or you develop thoughts of harming herself please seek medical attention medially.

## 2016-05-20 NOTE — Assessment & Plan Note (Signed)
Possibly some increased fatigue. We'll check TSH today. Continue Synthroid.

## 2016-05-20 NOTE — Assessment & Plan Note (Signed)
Patient with anxiety and depression. Currently on Celexa. Suspect that anxiety might be contributing to her sleep issues. We'll have her see a psychologist to see if they can provide her with tools for dealing with her stress.

## 2016-05-20 NOTE — Assessment & Plan Note (Signed)
Continues to have issues with this. Sleeps well with extended release Ambien though still is fatigued during the day. She reports having had a sleep study previously that ruled out sleep apnea. We'll evaluate her thyroid for a cause of fatigue. Consider fibromyalgia as a cause as well. We'll refill her Ambien CR. She'll continue to monitor.

## 2016-05-20 NOTE — Progress Notes (Signed)
[name], MD Phone: [phone]  Grace Shepard is a 50 y.o. female who presents today for follow-up.  Depression/anxiety: She is on Celexa for this. She's tried to come off of it in the past though had worsening anxiety. She notes some issues with anxiety mostly related to life stressors. No SI.  HYPOTHYROIDISM Disease Monitoring Weight changes: No  Skin Changes: Notes a few dry patches Heat/Cold intolerance: No Patient does note some increased fatigue again. Thinks this might be related to her fibromyalgia or her hypothyroidism.  Medication Monitoring Compliance:  Taking Synthroid   Last TSH:   Lab Results  Component Value Date   TSH 0.30 (L) 05/20/2016   Fibromyalgia. Followed by neurology. Also followed by them for migraines in the past. Currently on Lyrica. Notes this has been beneficial for her numbness and pain. Does note some fatigue. She occasionally has migraines. Migraines are typically associated with right facial numbness. Last migraine with numbness was yesterday. Resolved with use of diclofenac. Has had an MRI in the past and was advised that the numbness in her face is related to her migraines. Numbness in her hands and arms is improving significantly.  Insomnia: Patient notes having issues with sleep in the past. Notes this is relatively well controlled on extended release Ambien. Recently ran out of this and had to go back to immediate release Ambien. Notes she did not sleep as well. Notes excessive tiredness in the afternoons. Notes no drowsiness with the Ambien. No sleepwalking. Does snore. No apneic episodes. Reports she had a sleep study previously that ruled out sleep apnea.   PMH: nonsmoker.   ROS see history of present illness  Objective  Physical Exam Vitals:   05/20/16 1336  BP: 104/68  Pulse: 80  Temp: 98.2 F (36.8 C)    BP Readings from Last 3 Encounters:  05/20/16 104/68  12/04/15 110/68  07/19/15 110/72   Wt Readings from Last 3  Encounters:  05/20/16 130 lb 9.6 oz (59.2 kg)  12/04/15 130 lb 9.6 oz (59.2 kg)  07/19/15 127 lb 1.9 oz (57.7 kg)    Physical Exam  Constitutional: She is well-developed, well-nourished, and in no distress.  HENT:  Head: Normocephalic and atraumatic.  Mouth/Throat: Oropharynx is clear and moist. No oropharyngeal exudate.  Eyes: Conjunctivae are normal. Pupils are equal, round, and reactive to light.  Neck: No thyromegaly present.  Cardiovascular: Normal rate, regular rhythm and normal heart sounds.   Pulmonary/Chest: Effort normal and breath sounds normal.  Neurological: She is alert. Gait normal.  CN 2-12 intact, 5/5 strength in bilateral biceps, triceps, grip, quads, hamstrings, plantar and dorsiflexion, sensation to light touch intact in bilateral UE and LE, normal gait, 2+ patellar reflexes  Skin: Skin is warm and dry.  Psychiatric: Mood and affect normal.     Assessment/Plan: Please see individual problem list.  Hypothyroidism Possibly some increased fatigue. We'll check TSH today. Continue Synthroid.  Fibromyalgia Seems to be somewhat improved with Lyrica though does have some fatigue which could be related to fibromyalgia. Lyrica recently increased. She'll see how she does with this.  History of migraine Patient with long history of migraines. Occasionally gets them associated with right-sided facial numbness. Prior MRI unremarkable. Reports she was advised by neurology that this was related to her migraines. She is neurologically intact today. Advised to monitor for any new or changing symptoms. She'll seek medical attention if those occur.  Insomnia Continues to have issues with this. Sleeps well with extended release Ambien though  still is fatigued during the day. She reports having had a sleep study previously that ruled out sleep apnea. We'll evaluate her thyroid for a cause of fatigue. Consider fibromyalgia as a cause as well. We'll refill her Ambien CR. She'll continue  to monitor.  Anxiety and depression Patient with anxiety and depression. Currently on Celexa. Suspect that anxiety might be contributing to her sleep issues. We'll have her see a psychologist to see if they can provide her with tools for dealing with her stress.   Orders Placed This Encounter  Procedures  . Flu Vaccine QUAD 36+ mos IM  . TSH  . Ambulatory referral to Psychology    Referral Priority:   Routine    Referral Type:   Psychiatric    Referral Reason:   Specialty Services Required    Requested Specialty:   Psychology    Number of Visits Requested:   1    Meds ordered this encounter  Medications  . citalopram (CELEXA) 40 MG tablet    Sig: Take 1 tablet (40 mg total) by mouth daily.    Dispense:  90 tablet    Refill:  3  . levothyroxine (SYNTHROID, LEVOTHROID) 75 MCG tablet    Sig: Take 1 tablet (75 mcg total) by mouth daily.    Dispense:  90 tablet    Refill:  3  . zolpidem (AMBIEN CR) 6.25 MG CR tablet    Sig: Take 1 tablet (6.25 mg total) by mouth at bedtime as needed. for sleep    Dispense:  30 tablet    Refill:  2    [name], MD [place]

## 2016-05-21 ENCOUNTER — Telehealth: Payer: Self-pay | Admitting: *Deleted

## 2016-05-21 MED ORDER — LEVOTHYROXINE SODIUM 50 MCG PO TABS: 50.0000 ug | ORAL_TABLET | Freq: Every day | ORAL | 1 refills | Status: DC

## 2016-05-21 NOTE — Telephone Encounter (Signed)
Sent new thyroid dose to pharmacy

## 2016-06-16 ENCOUNTER — Encounter: Payer: Self-pay | Admitting: Emergency Medicine

## 2016-06-16 ENCOUNTER — Emergency Department
Admission: EM | Admit: 2016-06-16 | Discharge: 2016-06-16 | Disposition: A | Discharge status: Home / Self Care | Attending: Emergency Medicine | Admitting: Emergency Medicine

## 2016-06-16 ENCOUNTER — Emergency Department

## 2016-06-16 DIAGNOSIS — Z79899 Other long term (current) drug therapy: Secondary | ICD-10-CM | POA: Insufficient documentation

## 2016-06-16 DIAGNOSIS — R0789 Other chest pain: Secondary | ICD-10-CM | POA: Diagnosis not present

## 2016-06-16 DIAGNOSIS — R509 Fever, unspecified: Secondary | ICD-10-CM | POA: Diagnosis present

## 2016-06-16 DIAGNOSIS — M791 Myalgia, unspecified site: Secondary | ICD-10-CM

## 2016-06-16 DIAGNOSIS — E039 Hypothyroidism, unspecified: Secondary | ICD-10-CM | POA: Insufficient documentation

## 2016-06-16 HISTORY — DX: Fibromyalgia: M79.7

## 2016-06-16 LAB — TROPONIN I

## 2016-06-16 LAB — BASIC METABOLIC PANEL
ANION GAP: 9 (ref 5–15)
BUN: 11 mg/dL (ref 6–20)
CALCIUM: 9.2 mg/dL (ref 8.9–10.3)
CO2: 26 mmol/L (ref 22–32)
Chloride: 101 mmol/L (ref 101–111)
Creatinine, Ser: 0.9 mg/dL (ref 0.44–1.00)
GFR calc Af Amer: >60 mL/min (ref >60)
GLUCOSE: 88 mg/dL (ref 65–99)
Potassium: 3.5 mmol/L (ref 3.5–5.1)
SODIUM: 136 mmol/L (ref 135–145)

## 2016-06-16 LAB — CBC
HCT: 42.4 % (ref 35.0–47.0)
HEMOGLOBIN: 15.1 g/dL (ref 12.0–16.0)
MCH: 30.4 pg (ref 26.0–34.0)
MCHC: 35.5 g/dL (ref 32.0–36.0)
MCV: 85.5 fL (ref 80.0–100.0)
Platelets: 206 10*3/uL (ref 150–440)
RBC: 4.96 MIL/uL (ref 3.80–5.20)
RDW: 12.8 % (ref 11.5–14.5)
WBC: 8.2 10*3/uL (ref 3.6–11.0)

## 2016-06-16 LAB — INFLUENZA PANEL BY PCR (TYPE A & B)
INFLAPCR: NEGATIVE
Influenza B By PCR: NEGATIVE

## 2016-06-16 LAB — TSH: TSH: 1.917 u[IU]/mL (ref 0.350–4.500)

## 2016-06-16 MED ORDER — ACETAMINOPHEN 500 MG PO TABS
ORAL_TABLET | ORAL | Status: AC
  Administered 2016-06-16: 1000 mg via ORAL
  Filled 2016-06-16: qty 2

## 2016-06-16 MED ORDER — SODIUM CHLORIDE 0.9 % IV BOLUS (SEPSIS)
1000.0000 mL | Freq: Once | INTRAVENOUS | Status: AC
  Administered 2016-06-16: 1000 mL via INTRAVENOUS

## 2016-06-16 MED ORDER — KETOROLAC TROMETHAMINE 30 MG/ML IJ SOLN
30.0000 mg | Freq: Once | INTRAMUSCULAR | Status: AC
  Administered 2016-06-16: 30 mg via INTRAVENOUS
  Filled 2016-06-16: qty 1

## 2016-06-16 MED ORDER — ACETAMINOPHEN 500 MG PO TABS
1000.0000 mg | ORAL_TABLET | Freq: Once | ORAL | Status: AC
  Administered 2016-06-16: 1000 mg via ORAL
  Filled 2016-06-16: qty 2

## 2016-06-16 MED ORDER — IBUPROFEN 600 MG PO TABS
600.0000 mg | ORAL_TABLET | Freq: Once | ORAL | Status: DC
  Filled 2016-06-16: qty 1

## 2016-06-16 NOTE — ED Notes (Signed)

## 2016-06-16 NOTE — ED Notes (Signed)
Patient transported to X-ray 

## 2016-06-16 NOTE — ED Triage Notes (Signed)
Pt c/o generalized body aches starting at 1300 today. Denies cough, congestion, or runny nose. Has had fever as well.  C/o central chest pain under sternum; pain intermittent. Denies SHOB.

## 2016-06-16 NOTE — ED Triage Notes (Signed)
Pt reports received tdap yesterday

## 2016-06-16 NOTE — ED Provider Notes (Signed)
[facility] Emergency Department Provider Note  ____________________________________________   First MD Initiated Contact with Patient 06/16/16 1830     (approximate)  I have reviewed the triage vital signs and the nursing notes.   HISTORY  Chief Complaint Generalized Body Aches   HPI Grace Shepard is a 50 y.o. female with a history of fibromyalgia as well as thyroid disease was presenting to the emergency department with a fever and body aches over the past several hours. She is concerned because she got a TD vaccination yesterday. She also says that she had her flu shot about one week ago. She denies any cough, runny nose, nausea vomiting or diarrhea. Says that she was having intermittent chest pain but is chest pain-free at this time. Says the chest pain was retrosternal. Worse with deep breathing. However, when she takes deep breaths at this time there is no pain.   Past Medical History:  Diagnosis Date  . Allergic rhinitis   . Arthritis   . Depression   . Fibromyalgia   . Hyperlipidemia   . Ischemic colitis ([place])   . Migraine   . Thyroid disease   . UTI (lower urinary tract infection)     Patient Active Problem List   Diagnosis Date Noted  . Anxiety and depression 05/20/2016  . Hypothyroidism 12/04/2015  . Numbness 07/19/2015  . GERD (gastroesophageal reflux disease) 06/15/2015  . C. difficile colitis 06/02/2015  . Low back pain 06/02/2015  . Allergic rhinitis 05/22/2015  . Insomnia 05/22/2015  . Fibromyalgia 04/21/2015  . Chest pain 04/21/2015  . History of migraine 04/21/2015  . Light headedness 04/21/2015  . Memory problem 04/21/2015    Past Surgical History:  Procedure Laterality Date  . TUBAL LIGATION      Prior to Admission medications   Medication Sig Start Date End Date Taking? Authorizing Provider  citalopram (CELEXA) 40 MG tablet Take 1 tablet (40 mg total) by mouth daily. 05/20/16  Yes Leone Haven, MD    levothyroxine (SYNTHROID, LEVOTHROID) 50 MCG tablet Take 1 tablet (50 mcg total) by mouth daily before breakfast. 05/21/16  Yes Leone Haven, MD  magnesium oxide (MAG-OX) 400 MG tablet Take 600 mg by mouth daily.   Yes Historical Provider, MD  montelukast (SINGULAIR) 10 MG tablet Take 1 tablet (10 mg total) by mouth at bedtime. 05/19/15  Yes Leone Haven, MD  multivitamin (ONE-A-DAY MEN'S) TABS tablet Take 1 tablet by mouth daily.   Yes Historical Provider, MD  pantoprazole (PROTONIX) 40 MG tablet TAKE 1 TABLET DAILY 05/08/16  Yes Leone Haven, MD  pregabalin (LYRICA) 75 MG capsule Take 1 capsule (75 mg total) by mouth 2 (two) times daily. 01/10/16  Yes Leone Haven, MD  vitamin B-12 (CYANOCOBALAMIN) 1000 MCG tablet Take 1,000 mcg by mouth daily.    Yes Historical Provider, MD  zolpidem (AMBIEN CR) 6.25 MG CR tablet Take 1 tablet (6.25 mg total) by mouth at bedtime as needed. for sleep 05/20/16  Yes Leone Haven, MD    Allergies Bactrim [sulfamethoxazole-trimethoprim]  Family History  Problem Relation Age of Onset  . Alcoholism      uncle  . Drug abuse Sister   . Rheum arthritis Father   . Congestive Heart Failure Father   . Osteoarthritis Mother   . Stroke Mother   . Breast cancer Maternal Aunt   . Hyperlipidemia      parent  . Heart attack Maternal Grandfather   . Diabetes  Brother     Social History Social History  Substance Use Topics  . Smoking status: Never Smoker  . Smokeless tobacco: Never Used  . Alcohol use No    Review of Systems Constitutional: No fever/chills Eyes: No visual changes. ENT: No sore throat. Cardiovascular: As above Respiratory: Denies shortness of breath. Gastrointestinal: No abdominal pain.  No nausea, no vomiting.  No diarrhea.  No constipation. Genitourinary: Negative for dysuria. Musculoskeletal: Negative for back pain. Skin: Negative for rash. Neurological: Negative for headaches, focal weakness or  numbness.  10-point ROS otherwise negative.  ____________________________________________   PHYSICAL EXAM:  VITAL SIGNS: ED Triage Vitals  Enc Vitals Group     BP 06/16/16 1806 130/87     Pulse Rate 06/16/16 1806 (!) 130     Resp 06/16/16 1806 (!) 22     Temp 06/16/16 1806 (!) 101 F (38.3 C)     Temp Source 06/16/16 1806 Oral     SpO2 06/16/16 1806 99 %     Weight 06/16/16 1806 127 lb (57.6 kg)     Height 06/16/16 1806 4\' 11"  (1.499 m)     Head Circumference --      Peak Flow --      Pain Score 06/16/16 1807 0     Pain Loc --      Pain Edu? --      Excl. in [place]? --     Constitutional: Alert and oriented. Well appearing and in no acute distress. Eyes: Conjunctivae are normal. PERRL. EOMI. Head: Atraumatic. Nose: No congestion/rhinnorhea. Mouth/Throat: Mucous membranes are moist.   Neck: No stridor.   Cardiovascular: Tachycardic, regular rhythm. Grossly normal heart sounds.   Respiratory: Normal respiratory effort.  No retractions. Lungs CTAB. Gastrointestinal: Soft and nontender. No distention.  Musculoskeletal: No lower extremity tenderness nor edema.  No joint effusions. Neurologic:  Normal speech and language. No gross focal neurologic deficits are appreciated.  Skin:  Skin is warm, dry and intact. No rash noted. Psychiatric: Mood and affect are normal. Speech and behavior are normal.  ____________________________________________   LABS (all labs ordered are listed, but only abnormal results are displayed)  Labs Reviewed  CULTURE, BLOOD (ROUTINE X 2)  CULTURE, BLOOD (ROUTINE X 2)  BASIC METABOLIC PANEL  CBC  TROPONIN I  TSH  INFLUENZA PANEL BY PCR (TYPE A & B, H1N1)   ____________________________________________  EKG  ED ECG REPORT I, Doran Stabler, the attending physician, personally viewed and interpreted this ECG.   Date: 06/16/2016  EKG Time: 1810  Rate: 120  Rhythm: sinus tachycardia  Axis: Normal  Intervals:none  ST&T Change: No ST  segment elevation or depression. No abnormal T-wave inversion.  ____________________________________________  RADIOLOGY  DG Chest 2 View (Accession [id number]) (Order [id number])  Imaging  Date: 06/16/2016 Department: [place] Released By: Macy Mis, RN (auto-released) Authorizing: Orbie Pyo, MD  Exam Information   Status Exam Begun  Exam Ended   Final [99] 06/16/2016 6:38 PM 06/16/2016 6:48 PM  PACS Images   Show images for DG Chest 2 View  Study Result   CLINICAL DATA:  Generalized body aches and dry cough and congestion today.  EXAM: CHEST  2 VIEW  COMPARISON:  None.  FINDINGS: The cardiac silhouette, mediastinal and hilar contours are within normal limits. The lungs are clear. No pleural effusion or pneumothorax. The bony thorax is intact.  IMPRESSION: No acute cardiopulmonary findings.   Electronically Signed   By: Mamie Nick.  Gallerani M.D.   On: 06/16/2016 19:09     ____________________________________________   PROCEDURES  Procedure(s) performed:   Procedures  Critical Care performed:   ____________________________________________   INITIAL IMPRESSION / ASSESSMENT AND PLAN / ED COURSE  Pertinent labs & imaging results that were available during my care of the patient were reviewed by me and considered in my medical decision making (see chart for details).  ----------------------------------------- 10:06 PM on 06/16/2016 -----------------------------------------  Heart rate is now 98. Pain controlled with Tylenol as well as Toradol. Temp retaken and is 98.3. Patient says that her body aches have resolved. Able to take deep breaths without any chest pain. Complaining of a mild headache of a 3 out of 10. No meningismus. Able to range her head and neck fully without any restriction. Blood cultures taken. Suspect likely viral illness versus Tdap Reaction. Counseled patient to drink  plenty of fluids as well as to use ibuprofen and Tylenol for pain and fever relief. She'll be following up with her primary care doctor. She knows to return for any worsening or concerning symptoms. She is understanding of this plan 1 to comply.  Clinical Course      ____________________________________________   FINAL CLINICAL IMPRESSION(S) / ED DIAGNOSES  Fever. Myalgias.    NEW MEDICATIONS STARTED DURING THIS VISIT:  New Prescriptions   No medications on file     Note:  This document was prepared using Dragon voice recognition software and may include unintentional dictation errors.    Orbie Pyo, MD 06/16/16 2207

## 2016-06-18 ENCOUNTER — Telehealth: Payer: Self-pay | Admitting: Family Medicine

## 2016-06-18 NOTE — Telephone Encounter (Signed)
FYI - Pt called and stated that she cut right thumb on her mandolin on Saturday, went to walk in clinic and had the dermabond done and they also gave her a Tdap shot. Sunday afternoon between 1-2 patient started having chills, body aches, headache and fever of 102.4, pt went to Ed. Doctor at the ED ruled it as a reaction to the Tdap, they did chest xray, flu test, and blood work, everything came back normal. Doctor just wanted pt to advise you of the this.  Pt stated that she is still having a headache believes that it is do to her fibromyalgia she has decreased the dosage of Lyrica 75 mg 2x day.  Call pt @ [phone]

## 2016-06-19 ENCOUNTER — Other Ambulatory Visit: Payer: Self-pay

## 2016-06-19 MED ORDER — LEVOTHYROXINE SODIUM 50 MCG PO TABS: 50.0000 ug | ORAL_TABLET | Freq: Every day | ORAL | 1 refills | Status: DC

## 2016-06-19 MED ORDER — PREGABALIN 75 MG PO CAPS: 75.0000 mg | ORAL_CAPSULE | Freq: Two times a day (BID) | ORAL | 1 refills | Status: DC

## 2016-06-19 NOTE — Telephone Encounter (Signed)
Noted. Please check with the patient today to see if she is still doing better. If not she needs to be evaluated again. Thanks.

## 2016-06-19 NOTE — Telephone Encounter (Signed)
FYI

## 2016-06-19 NOTE — Telephone Encounter (Signed)
Patient is doing better today. She needs a refill of the Lyrica 75 mg sent to her pharmacy.

## 2016-06-19 NOTE — Telephone Encounter (Signed)
Printed

## 2016-06-20 ENCOUNTER — Ambulatory Visit (INDEPENDENT_AMBULATORY_CARE_PROVIDER_SITE_OTHER): Admitting: Psychology

## 2016-06-20 DIAGNOSIS — F4323 Adjustment disorder with mixed anxiety and depressed mood: Secondary | ICD-10-CM | POA: Diagnosis not present

## 2016-06-20 NOTE — Telephone Encounter (Signed)
RX faxed

## 2016-06-21 LAB — CULTURE, BLOOD (ROUTINE X 2)
CULTURE: NO GROWTH
Culture: NO GROWTH

## 2016-07-03 ENCOUNTER — Other Ambulatory Visit: Payer: Self-pay | Admitting: Family Medicine

## 2016-07-03 NOTE — Telephone Encounter (Signed)
Spoke with patient and she is going to call the pharmacy to see if they can fill this. She just had a RX filled about 2 weeks ago at [facility]. She will call us if she needs [place] to do anything.

## 2016-07-03 NOTE — Telephone Encounter (Signed)
Pt called about needing a Rx of zolpidem (AMBIEN CR) 6.25 MG CR tablet it's stating that medication is awaiting approval at [place], [place]. Thank you!  Call pt @ [phone].

## 2016-07-08 ENCOUNTER — Ambulatory Visit (INDEPENDENT_AMBULATORY_CARE_PROVIDER_SITE_OTHER): Admitting: Psychology

## 2016-07-08 DIAGNOSIS — F4323 Adjustment disorder with mixed anxiety and depressed mood: Secondary | ICD-10-CM | POA: Diagnosis not present

## 2016-07-15 MED ORDER — ZOLPIDEM TARTRATE ER 6.25 MG PO TBCR: 6.2500 mg | EXTENDED_RELEASE_TABLET | Freq: Every evening | ORAL | 2 refills | Status: DC | PRN

## 2016-07-15 NOTE — Telephone Encounter (Addendum)
Pt came in and dropped off a letter [facility] and wanted ypu to see if you could get them to fill it.. Please advise..  Letter placed in Dr. Tharon Aquas folder will be delivered with mail

## 2016-07-15 NOTE — Telephone Encounter (Signed)
Printed.  Please fax.

## 2016-07-15 NOTE — Addendum Note (Signed)
Addended by: Leone Haven on: 07/15/2016 03:27 PM   Modules accepted: Orders

## 2016-07-15 NOTE — Telephone Encounter (Signed)
Patient is needing [facility]

## 2016-07-22 ENCOUNTER — Ambulatory Visit (INDEPENDENT_AMBULATORY_CARE_PROVIDER_SITE_OTHER): Admitting: Psychology

## 2016-07-22 DIAGNOSIS — F4323 Adjustment disorder with mixed anxiety and depressed mood: Secondary | ICD-10-CM | POA: Diagnosis not present

## 2016-08-09 ENCOUNTER — Other Ambulatory Visit: Payer: Self-pay | Admitting: Family Medicine

## 2016-08-13 ENCOUNTER — Ambulatory Visit (INDEPENDENT_AMBULATORY_CARE_PROVIDER_SITE_OTHER): Admitting: Psychology

## 2016-08-13 DIAGNOSIS — F4323 Adjustment disorder with mixed anxiety and depressed mood: Secondary | ICD-10-CM

## 2016-08-23 ENCOUNTER — Ambulatory Visit (INDEPENDENT_AMBULATORY_CARE_PROVIDER_SITE_OTHER): Admitting: Family Medicine

## 2016-08-23 ENCOUNTER — Encounter: Payer: Self-pay | Admitting: Family Medicine

## 2016-08-23 VITALS — BP 108/80 | HR 70 | Temp 98.4°F | Wt 130.6 lb

## 2016-08-23 DIAGNOSIS — K219 Gastro-esophageal reflux disease without esophagitis: Secondary | ICD-10-CM

## 2016-08-23 DIAGNOSIS — F418 Other specified anxiety disorders: Secondary | ICD-10-CM | POA: Diagnosis not present

## 2016-08-23 DIAGNOSIS — F419 Anxiety disorder, unspecified: Secondary | ICD-10-CM

## 2016-08-23 DIAGNOSIS — M797 Fibromyalgia: Secondary | ICD-10-CM

## 2016-08-23 DIAGNOSIS — Z8669 Personal history of other diseases of the nervous system and sense organs: Secondary | ICD-10-CM | POA: Diagnosis not present

## 2016-08-23 DIAGNOSIS — F329 Major depressive disorder, single episode, unspecified: Secondary | ICD-10-CM

## 2016-08-23 NOTE — Assessment & Plan Note (Signed)
Has symptoms when not taking Protonix. We will refer to GI for consideration of EGD.

## 2016-08-23 NOTE — Progress Notes (Signed)
Pre visit review using our clinic review tool, if applicable. No additional management support is needed unless otherwise documented below in the visit note. 

## 2016-08-23 NOTE — Assessment & Plan Note (Signed)
Currently well controlled. She'll continue Celexa and continue to follow with the psychologist.

## 2016-08-23 NOTE — Assessment & Plan Note (Signed)
Seems to be improved with Lyrica. Suspect fatigue is related to her fibromyalgia. Most recent TSH in the normal range. Discussed continuing Lyrica. Continue to monitor fatigue. If not improving letting us know.

## 2016-08-23 NOTE — Progress Notes (Signed)
  [name], MD Phone: [phone]  Grace Shepard is a 51 y.o. female who presents today for follow-up.  Anxiety/depression: Patient notes no depression at this time. Does get anxious if she has to much to do and feels pressure to do those things. Taking Celexa. Seeing psychology which has been very helpful. No SI or HI.  GERD: Taking Protonix. Only gets symptoms if she doesn't take the Protonix. No abdominal pain or blood in her stool.  Fibromyalgia: Notes the Lyrica through neurology has helped significantly. Mild fibromyalgia discomfort. Does note some fatigue with the weather change to cold weather and then the warm weather. Notes no chest pain or shortness of breath. Thyroid function has been normal recently. Has had off and on fatigue over the years related to her fibromyalgia.  Migraines: Saw neurology for this. Started her on Lyrica. This has helped with the numbness that she was previously having. Has not had any numbness in some time. No weakness. Does occasionally get migraines though much less frequently than previously. Start behind her right eye. Has aura with these. Some mild dizziness.  PMH: nonsmoker.   ROS see history of present illness  Objective  Physical Exam Vitals:   08/23/16 1325  BP: 108/80  Pulse: 70  Temp: 98.4 F (36.9 C)    BP Readings from Last 3 Encounters:  08/23/16 108/80  06/16/16 102/70  05/20/16 104/68   Wt Readings from Last 3 Encounters:  08/23/16 130 lb 9.6 oz (59.2 kg)  06/16/16 127 lb (57.6 kg)  05/20/16 130 lb 9.6 oz (59.2 kg)    Physical Exam  Constitutional: No distress.  HENT:  Head: Normocephalic and atraumatic.  Mouth/Throat: Oropharynx is clear and moist. No oropharyngeal exudate.  Eyes: Conjunctivae are normal. Pupils are equal, round, and reactive to light.  Cardiovascular: Normal rate, regular rhythm and normal heart sounds.   Pulmonary/Chest: Effort normal and breath sounds normal.  Neurological: She is  alert.  CN 2-12 intact, 5/5 strength in bilateral biceps, triceps, grip, quads, hamstrings, plantar and dorsiflexion, sensation to light touch intact in bilateral UE and LE, normal gait, 2+ patellar reflexes  Skin: Skin is warm and dry. She is not diaphoretic.  Psychiatric: Mood and affect normal.     Assessment/Plan: Please see individual problem list.  GERD (gastroesophageal reflux disease) Has symptoms when not taking Protonix. We will refer to GI for consideration of EGD.  Anxiety and depression Currently well controlled. She'll continue Celexa and continue to follow with the psychologist.  Fibromyalgia Seems to be improved with Lyrica. Suspect fatigue is related to her fibromyalgia. Most recent TSH in the normal range. Discussed continuing Lyrica. Continue to monitor fatigue. If not improving letting us know.  History of migraine Seems to be improved from previously. Following with neurology for this. Neurologically intact today. She will continue to monitor.   Orders Placed This Encounter  Procedures  . Ambulatory referral to Gastroenterology    Referral Priority:   Routine    Referral Type:   Consultation    Referral Reason:   Specialty Services Required    Number of Visits Requested:   [place], MD [place]

## 2016-08-23 NOTE — Assessment & Plan Note (Signed)
Seems to be improved from previously. Following with neurology for this. Neurologically intact today. She will continue to monitor.

## 2016-08-23 NOTE — Patient Instructions (Addendum)
Nice to see you.  We will refer you to GI. Please monitor your anxiety and depression and if this worsens please let us know. If your fatigue gets any worse please let us know. Monitor your migraines and if they worsen or change in any manner please seek medical attention.

## 2016-09-09 ENCOUNTER — Ambulatory Visit: Admitting: Psychology

## 2016-09-23 ENCOUNTER — Ambulatory Visit (INDEPENDENT_AMBULATORY_CARE_PROVIDER_SITE_OTHER): Admitting: Psychology

## 2016-09-23 DIAGNOSIS — F4323 Adjustment disorder with mixed anxiety and depressed mood: Secondary | ICD-10-CM

## 2016-10-07 ENCOUNTER — Ambulatory Visit: Admitting: Psychology

## 2016-10-11 ENCOUNTER — Other Ambulatory Visit: Payer: Self-pay

## 2016-10-11 NOTE — Telephone Encounter (Signed)
Refilled on 07/15/2016 Last Ov: 08/23/2016 Next Ov: 11/21/2016  Pt would like a 90 day supply if possible.

## 2016-10-14 MED ORDER — ZOLPIDEM TARTRATE ER 6.25 MG PO TBCR: 6.2500 mg | EXTENDED_RELEASE_TABLET | Freq: Every evening | ORAL | 0 refills | Status: DC | PRN

## 2016-10-21 ENCOUNTER — Ambulatory Visit: Admitting: Psychology

## 2016-11-04 ENCOUNTER — Other Ambulatory Visit: Payer: Self-pay | Admitting: Family Medicine

## 2016-11-18 ENCOUNTER — Ambulatory Visit: Payer: Self-pay | Admitting: Obstetrics and Gynecology

## 2016-11-21 ENCOUNTER — Ambulatory Visit: Payer: Self-pay | Admitting: Family Medicine

## 2016-12-23 LAB — HM MAMMOGRAPHY

## 2016-12-24 ENCOUNTER — Encounter: Payer: Self-pay | Admitting: Family Medicine

## 2016-12-26 ENCOUNTER — Telehealth: Payer: Self-pay | Admitting: Family Medicine

## 2016-12-26 NOTE — Telephone Encounter (Signed)
Pt called and stated that her Neurologist Dr. Melrose Nakayama would not refill her diclofenac until she comes in for her appt on 6/25. Pt would like to know if Dr. Caryl Bis could refill her rx until then. Please advise, thank you!  [place], [place]  Call pt @ [phone]

## 2016-12-26 NOTE — Telephone Encounter (Signed)
Please advise 

## 2016-12-27 MED ORDER — DICLOFENAC SODIUM 75 MG PO TBEC: 75.0000 mg | DELAYED_RELEASE_TABLET | Freq: Every day | ORAL | 0 refills | Status: DC | PRN

## 2016-12-27 NOTE — Telephone Encounter (Signed)
Sent to pharmacy 

## 2016-12-27 NOTE — Telephone Encounter (Signed)
Patient states she takes it for migraines and she states they have increased recently. She has used 180 in the last 4 years, she is now using no more then 5 a week. Patient needs enough to last her until her appointment.

## 2016-12-27 NOTE — Telephone Encounter (Signed)
It appears on review of the phone note from neurology this medication was prescribed in 2014. Please determine how often she has been taking it and for what she is taking it. I would then determine if it is reasonable to prescribe. Thanks.

## 2017-01-16 ENCOUNTER — Other Ambulatory Visit: Payer: Self-pay | Admitting: Family Medicine

## 2017-01-21 ENCOUNTER — Other Ambulatory Visit: Payer: Self-pay | Admitting: Family Medicine

## 2017-01-21 NOTE — Telephone Encounter (Signed)
Refill given. Please fax. Patient needs follow-up scheduled. Thanks.

## 2017-01-21 NOTE — Telephone Encounter (Signed)
Last refill was 10/14/16, for 90 days, last OV was 08/23/16, Please advise, thanks

## 2017-01-22 NOTE — Telephone Encounter (Signed)
Left message to return call 

## 2017-01-24 NOTE — Telephone Encounter (Signed)
Patient notified and had bad cell ohoine reception, will call and schedule appmt when she has better cell reception

## 2017-02-03 ENCOUNTER — Ambulatory Visit: Payer: Self-pay | Admitting: Obstetrics and Gynecology

## 2017-02-03 DIAGNOSIS — G479 Sleep disorder, unspecified: Secondary | ICD-10-CM | POA: Insufficient documentation

## 2017-02-03 DIAGNOSIS — R519 Headache, unspecified: Secondary | ICD-10-CM | POA: Insufficient documentation

## 2017-04-11 ENCOUNTER — Other Ambulatory Visit: Payer: Self-pay

## 2017-04-11 MED ORDER — ZOLPIDEM TARTRATE ER 6.25 MG PO TBCR: 6.2500 mg | EXTENDED_RELEASE_TABLET | Freq: Every evening | ORAL | 0 refills | Status: DC | PRN

## 2017-04-11 NOTE — Telephone Encounter (Signed)
Prescription faxed. I will provide a one-month refill. She needs to be scheduled for an appointment.

## 2017-04-11 NOTE — Telephone Encounter (Signed)
Last office visit 08/23/16 No office visit scheduled

## 2017-04-15 NOTE — Telephone Encounter (Signed)
Patient advised of below , appointment scheduled.

## 2017-05-01 ENCOUNTER — Encounter: Payer: Self-pay | Admitting: Family Medicine

## 2017-05-01 ENCOUNTER — Encounter (INDEPENDENT_AMBULATORY_CARE_PROVIDER_SITE_OTHER): Payer: Self-pay

## 2017-05-01 ENCOUNTER — Ambulatory Visit (INDEPENDENT_AMBULATORY_CARE_PROVIDER_SITE_OTHER): Admitting: Family Medicine

## 2017-05-01 VITALS — BP 100/62 | HR 74 | Temp 98.2°F | Wt 129.8 lb

## 2017-05-01 DIAGNOSIS — F5101 Primary insomnia: Secondary | ICD-10-CM | POA: Diagnosis not present

## 2017-05-01 DIAGNOSIS — F419 Anxiety disorder, unspecified: Secondary | ICD-10-CM | POA: Diagnosis not present

## 2017-05-01 DIAGNOSIS — F329 Major depressive disorder, single episode, unspecified: Secondary | ICD-10-CM

## 2017-05-01 DIAGNOSIS — E039 Hypothyroidism, unspecified: Secondary | ICD-10-CM | POA: Diagnosis not present

## 2017-05-01 DIAGNOSIS — Z5181 Encounter for therapeutic drug level monitoring: Secondary | ICD-10-CM

## 2017-05-01 DIAGNOSIS — F32A Depression, unspecified: Secondary | ICD-10-CM

## 2017-05-01 LAB — COMPREHENSIVE METABOLIC PANEL
ALT: 13 U/L (ref 0–35)
AST: 16 U/L (ref 0–37)
Albumin: 4.4 g/dL (ref 3.5–5.2)
Alkaline Phosphatase: 86 U/L (ref 39–117)
BILIRUBIN TOTAL: 0.5 mg/dL (ref 0.2–1.2)
BUN: 9 mg/dL (ref 6–23)
CO2: 30 meq/L (ref 19–32)
CREATININE: 0.82 mg/dL (ref 0.40–1.20)
Calcium: 9.7 mg/dL (ref 8.4–10.5)
Chloride: 106 mEq/L (ref 96–112)
GFR: 78.72 mL/min (ref >60.00)
GLUCOSE: 86 mg/dL (ref 70–99)
Potassium: 4.2 mEq/L (ref 3.5–5.1)
Sodium: 142 mEq/L (ref 135–145)
Total Protein: 6.7 g/dL (ref 6.0–8.3)

## 2017-05-01 LAB — TSH: TSH: 14.46 u[IU]/mL — ABNORMAL HIGH (ref 0.35–4.50)

## 2017-05-01 MED ORDER — ZOLPIDEM TARTRATE ER 6.25 MG PO TBCR: 6.2500 mg | EXTENDED_RELEASE_TABLET | Freq: Every evening | ORAL | 2 refills | Status: DC | PRN

## 2017-05-01 NOTE — Assessment & Plan Note (Signed)
Check TSH.  Continue Synthroid. 

## 2017-05-01 NOTE — Patient Instructions (Signed)
Nice to see you. We will check lab work today and contact you with the results. 

## 2017-05-01 NOTE — Assessment & Plan Note (Signed)
Tolerating Ambien. Refill given.

## 2017-05-01 NOTE — Progress Notes (Signed)
  [name], MD Phone: [phone]  Grace Shepard is a 51 y.o. female who presents today for follow-up.  Hypothyroidism: Taking Synthroid. No skin changes. No heat or cold intolerance changes. No weight changes.  Insomnia: Ambien CR is working quite well for her. She typically gets 8 hours sleep. Mostly waking up well rested after this. She takes it early enough in the evening that she has no lasting effects the next day.  Anxiety/depression: She notes no symptoms. Currently on Celexa. No SI or HI.   PMH: nonsmoker.   ROS see history of present illness  Objective  Physical Exam Vitals:   05/01/17 1128  BP: 100/62  Pulse: 74  Temp: 98.2 F (36.8 C)  SpO2: 94%    BP Readings from Last 3 Encounters:  05/01/17 100/62  08/23/16 108/80  06/16/16 102/70   Wt Readings from Last 3 Encounters:  05/01/17 129 lb 12.8 oz (58.9 kg)  08/23/16 130 lb 9.6 oz (59.2 kg)  06/16/16 127 lb (57.6 kg)    Physical Exam  Constitutional: She is well-developed, well-nourished, and in no distress.  Cardiovascular: Normal rate, regular rhythm and normal heart sounds.   Pulmonary/Chest: Effort normal and breath sounds normal.  Musculoskeletal: She exhibits no edema.  Neurological: She is alert. Gait normal.  Skin: Skin is warm and dry.     Assessment/Plan: Please see individual problem list.  Hypothyroidism Check TSH. Continue Synthroid.  Insomnia Tolerating Ambien. Refill given.  Anxiety and depression Well-controlled. Continue Celexa.   Orders Placed This Encounter  Procedures  . TSH  . Comp Met (CMET)    Meds ordered this encounter  Medications  . zolpidem (AMBIEN CR) 6.25 MG CR tablet    Sig: Take 1 tablet (6.25 mg total) by mouth at bedtime as needed. for sleep    Dispense:  30 tablet    Refill:  2    [name], MD [place]

## 2017-05-01 NOTE — Assessment & Plan Note (Signed)
Well controlled Continue Celexa 

## 2017-05-02 ENCOUNTER — Other Ambulatory Visit: Payer: Self-pay | Admitting: Family Medicine

## 2017-05-02 DIAGNOSIS — E039 Hypothyroidism, unspecified: Secondary | ICD-10-CM

## 2017-05-02 MED ORDER — LEVOTHYROXINE SODIUM 75 MCG PO TABS: 75.0000 ug | ORAL_TABLET | Freq: Every day | ORAL | 3 refills | Status: DC

## 2017-05-21 ENCOUNTER — Other Ambulatory Visit: Payer: Self-pay | Admitting: Family Medicine

## 2017-05-29 ENCOUNTER — Ambulatory Visit: Payer: Self-pay | Admitting: Internal Medicine

## 2017-05-29 ENCOUNTER — Encounter: Payer: Self-pay | Admitting: Internal Medicine

## 2017-05-29 ENCOUNTER — Ambulatory Visit (INDEPENDENT_AMBULATORY_CARE_PROVIDER_SITE_OTHER): Admitting: Internal Medicine

## 2017-05-29 VITALS — BP 106/74 | HR 77 | Temp 98.2°F | Wt 131.0 lb

## 2017-05-29 DIAGNOSIS — R3915 Urgency of urination: Secondary | ICD-10-CM

## 2017-05-29 DIAGNOSIS — R82998 Other abnormal findings in urine: Secondary | ICD-10-CM

## 2017-05-29 DIAGNOSIS — R8290 Unspecified abnormal findings in urine: Secondary | ICD-10-CM | POA: Diagnosis not present

## 2017-05-29 LAB — POC URINALSYSI DIPSTICK (AUTOMATED)
BILIRUBIN UA: NEGATIVE
Blood, UA: NEGATIVE
Glucose, UA: NEGATIVE
KETONES UA: NEGATIVE
LEUKOCYTES UA: NEGATIVE
Nitrite, UA: NEGATIVE
PH UA: 6 (ref 5.0–8.0)
Protein, UA: NEGATIVE
Spec Grav, UA: 1.015 (ref 1.010–1.025)
Urobilinogen, UA: 0.2 E.U./dL

## 2017-05-29 NOTE — Progress Notes (Signed)
HPI  Pt presents to the clinic today with c/o urinary urgency, dark urine with an odor. She reports this started 6 days ago. She reports associated lower abdominal pressure. She denies fever, chills, nausea or low back pain. She reports mild vaginal discharge, but this is not abnormal for her. She denies abnormal uterine bleeding. Her bowels are moving normally. She denies recent changes in diet or activity level. She has not taken anything OTC for her symptoms.   Review of Systems  Past Medical History:  Diagnosis Date  . Allergic rhinitis   . Arthritis   . Depression   . Fibromyalgia   . Hyperlipidemia   . Ischemic colitis ([place])   . Migraine   . Thyroid disease   . UTI (lower urinary tract infection)     Family History  Problem Relation Age of Onset  . Alcoholism Unknown        uncle  . Drug abuse Sister   . Rheum arthritis Father   . Congestive Heart Failure Father   . Osteoarthritis Mother   . Stroke Mother   . Breast cancer Maternal Aunt   . Hyperlipidemia Unknown        parent  . Heart attack Maternal Grandfather   . Diabetes Brother     Social History   Social History  . Marital status: Married    Spouse name: N/A  . Number of children: N/A  . Years of education: N/A   Occupational History  . Not on file.   Social History Main Topics  . Smoking status: Never Smoker  . Smokeless tobacco: Never Used  . Alcohol use No  . Drug use: No  . Sexual activity: Not on file   Other Topics Concern  . Not on file   Social History Narrative  . No narrative on file    Allergies  Allergen Reactions  . Bactrim [Sulfamethoxazole-Trimethoprim] Hives     Constitutional: Denies fever, malaise, fatigue, headache or abrupt weight changes.   GU: Pt reports urgency, dark urine with odor, and bladder pressure. Denies burning sensation, blood in urine.   No other specific complaints in a complete review of systems (except as listed in HPI above).    Objective:   Physical Exam  BP 106/74   Pulse 77   Temp 98.2 F (36.8 C) (Oral)   Wt 131 lb (59.4 kg)   SpO2 98%   BMI 26.46 kg/m   Wt Readings from Last 3 Encounters:  05/29/17 131 lb (59.4 kg)  05/01/17 129 lb 12.8 oz (58.9 kg)  08/23/16 130 lb 9.6 oz (59.2 kg)    General: Appears her stated age, well developed, well nourished in NAD. Abdomen: Soft. Normal bowel sounds. No distention or masses noted.  Tender to palpation over the bladder area. No CVA tenderness.       Assessment & Plan:   Urgency, Dark Urine, Urine Odor and Bladder Pressure:   Urinalysis: normal Will send urine culture Drink plenty of fluids Will send off wet prep, pt self swabbed  RTC as needed or if symptoms persist. [name], NP

## 2017-05-29 NOTE — Patient Instructions (Signed)
Urine Culture and Sensitivity Testing  Why am I having this test?  A urine culture is a test to see if germs grow from your urine sample. Normally, urine is free of germs (sterile). Germs in urine are usually bacteria. Sometimes they can be yeasts. These germs can cause a urinary tract infection (UTI).  You may have this test if you have symptoms of a UTI. These may include:   Frequent urination.   Burning pain when passing urine.    If you are pregnant, your health care provider may order this test to screen you for a UTI.  When you pass urine, the urine flows through the tube that empties your bladder (urethra). In men, urine comes out through an opening at the tip of the penis. In women, it comes out of the body from just above the vaginal opening. These areas may have bacteria near them that normally live on the skin (normal flora).  What kind of sample is taken?  A urine sample for a culture test must be collected in a way that keeps normal flora from getting into the sample. The method used most often is called a clean-catch sample. In a few cases, urine may need to be collected directly from the bladder using a thin, flexible tube (catheter). The health care provider puts the catheter through the person's urethra and into the bladder.  Your urine sample will be placed onto plates containing a substance that encourages bacteria to grow (agar plates). These plates are kept at body temperature for 24-48 hours to see if bacteria or other germs grow. Then, a lab technician examines them under a microscope to check for germs.  Any germs that grow from the culture will be tested against a variety of medicines to find the one that works best (sensitivity testing). For a UTI caused by bacteria, several types of antibiotic medicines may be tested.  How do I prepare for this test?   Do not urinate for about an hour before collecting the sample.   Drink a glass of water about 20 minutes before collecting the  sample.   Tell your health care provider if you have been taking antibiotics. This may affect the results of your test.  Your health care provider may give you sterile wipes to clean your vagina or penis to prepare for collecting a clean-catch sample. To collect the sample, you will need to do the following:  For Women and Girls   Sit on the toilet and spread the lips of your vagina.   Use one wipe to clean your vaginal area from front to back.   Use a second wipe to clean the opening of your urethra.   Pass a small amount of urine directly into the toilet while still spreading your vagina.   Then, hold the sterile cup underneath you and urinate into it.   Fill the cup about halfway. Cap it and return it for testing.  For Men and Boys   Use the sterile wipe to clean the tip of your penis.   Pass a small amount of urine directly into the toilet first.   Then, urinate into the sterile cup.   Fill the cup about halfway. Cap it and return it for testing.  What do the results mean?  The result of a urine culture and sensitivity test will be positive or negative.   If enough bacteria grow from your urine sample, your test result is considered positive.   If many   different bacteria grow from your urine sample, your test may be reported as contaminated.   If no bacteria grow from your sample after 24-48 hours, your test result is considered negative.   Results of sensitivity testing let your health care provider know which medicines to use to treat your infection.    If the results of your urine culture are negative, this means:   It is less likely that you have a UTI.   Your test may be repeated if you still have symptoms.    If the results of your urine culture are positive, this means:   It is more likely that you have a UTI.   You may need to start treatment based on your sensitivity results.    Talk to your health care provider to discuss your results, treatment options, and if necessary, the need  for more tests.  It is your responsibility to obtain your test results. Ask the lab or department performing the test when and how you will get your results. Talk with your health care provider if you have any questions about your results.  Talk with your health care provider to discuss your results, treatment options, and if necessary, the need for more tests. Talk with your health care provider if you have any questions about your results.  This information is not intended to replace advice given to you by your health care provider. Make sure you discuss any questions you have with your health care provider.  Document Released: 08/23/2004 Document Revised: 04/03/2016 Document Reviewed: 11/25/2013  Elsevier Interactive Patient Education  2018 [employer]

## 2017-05-30 ENCOUNTER — Other Ambulatory Visit: Payer: Self-pay | Admitting: Internal Medicine

## 2017-05-30 LAB — WET PREP BY MOLECULAR PROBE
Candida species: NOT DETECTED
MICRO NUMBER: 81165680
SPECIMEN QUALITY: ADEQUATE
TRICHOMONAS VAG: NOT DETECTED

## 2017-05-30 MED ORDER — METRONIDAZOLE 0.75 % VA GEL: 1.0000 | Freq: Two times a day (BID) | VAGINAL | 0 refills | Status: DC

## 2017-05-31 LAB — URINE CULTURE
MICRO NUMBER: 81165688
SPECIMEN QUALITY: ADEQUATE

## 2017-06-02 ENCOUNTER — Telehealth: Payer: Self-pay | Admitting: Family Medicine

## 2017-06-02 NOTE — Telephone Encounter (Signed)
Copied from [id number]. Topic: Quick Communication - See Telephone Encounter >> Jun 02, 2017  1:44 PM Grace Shepard wrote: Reason for CRM: on mychart saw where urine came back with something found in her urine. She wondering if she needs an antibiotic or whats going to be done about what was found. Pt wondering if its a UTI.

## 2017-06-06 ENCOUNTER — Ambulatory Visit: Payer: Self-pay

## 2017-06-06 ENCOUNTER — Ambulatory Visit: Payer: Self-pay | Admitting: Internal Medicine

## 2017-06-13 ENCOUNTER — Other Ambulatory Visit (INDEPENDENT_AMBULATORY_CARE_PROVIDER_SITE_OTHER)

## 2017-06-13 ENCOUNTER — Ambulatory Visit (INDEPENDENT_AMBULATORY_CARE_PROVIDER_SITE_OTHER)

## 2017-06-13 DIAGNOSIS — E039 Hypothyroidism, unspecified: Secondary | ICD-10-CM | POA: Diagnosis not present

## 2017-06-13 DIAGNOSIS — Z23 Encounter for immunization: Secondary | ICD-10-CM

## 2017-06-13 LAB — TSH: TSH: 0.56 u[IU]/mL (ref 0.35–4.50)

## 2017-06-14 ENCOUNTER — Encounter: Payer: Self-pay | Admitting: Family Medicine

## 2017-07-15 ENCOUNTER — Other Ambulatory Visit: Payer: Self-pay | Admitting: Family Medicine

## 2017-08-04 ENCOUNTER — Other Ambulatory Visit: Payer: Self-pay

## 2017-08-04 MED ORDER — ZOLPIDEM TARTRATE ER 6.25 MG PO TBCR: 6.2500 mg | EXTENDED_RELEASE_TABLET | Freq: Every evening | ORAL | 2 refills | Status: DC | PRN

## 2017-08-04 NOTE — Telephone Encounter (Signed)
Last OV 05/01/17 last filled 05/01/17 30 2rf

## 2017-08-04 NOTE — Telephone Encounter (Signed)
faxed

## 2017-08-07 ENCOUNTER — Other Ambulatory Visit: Payer: Self-pay

## 2017-08-07 ENCOUNTER — Encounter: Payer: Self-pay | Admitting: Internal Medicine

## 2017-08-07 MED ORDER — ZOLPIDEM TARTRATE ER 6.25 MG PO TBCR: 6.2500 mg | EXTENDED_RELEASE_TABLET | Freq: Every evening | ORAL | 0 refills | Status: DC | PRN

## 2017-08-07 NOTE — Telephone Encounter (Signed)
Last OV 05/01/17 last filled 08/04/17 [facility], [facility] is requesting 90 day supply

## 2017-08-07 NOTE — Telephone Encounter (Signed)
Will fax once signed.

## 2017-09-03 ENCOUNTER — Telehealth: Payer: Self-pay | Admitting: Family Medicine

## 2017-09-03 NOTE — Telephone Encounter (Signed)
That is okay with me.  The patient is very nice.

## 2017-09-03 NOTE — Telephone Encounter (Signed)
Fine with me

## 2017-09-03 NOTE — Telephone Encounter (Signed)
Pt is requesting to transfer care from Dr. Caryl Bis to [name], NP. Pt states she feels more comfortable with a female provider. Is it OK to transfer?

## 2017-09-16 ENCOUNTER — Ambulatory Visit (INDEPENDENT_AMBULATORY_CARE_PROVIDER_SITE_OTHER): Admitting: Internal Medicine

## 2017-09-16 ENCOUNTER — Encounter: Payer: Self-pay | Admitting: Internal Medicine

## 2017-09-16 VITALS — BP 104/66 | HR 85 | Temp 98.0°F | Wt 131.0 lb

## 2017-09-16 DIAGNOSIS — F5104 Psychophysiologic insomnia: Secondary | ICD-10-CM | POA: Diagnosis not present

## 2017-09-16 DIAGNOSIS — K219 Gastro-esophageal reflux disease without esophagitis: Secondary | ICD-10-CM

## 2017-09-16 DIAGNOSIS — B9789 Other viral agents as the cause of diseases classified elsewhere: Secondary | ICD-10-CM

## 2017-09-16 DIAGNOSIS — G43019 Migraine without aura, intractable, without status migrainosus: Secondary | ICD-10-CM | POA: Diagnosis not present

## 2017-09-16 DIAGNOSIS — J329 Chronic sinusitis, unspecified: Secondary | ICD-10-CM

## 2017-09-16 DIAGNOSIS — J302 Other seasonal allergic rhinitis: Secondary | ICD-10-CM | POA: Diagnosis not present

## 2017-09-16 DIAGNOSIS — E039 Hypothyroidism, unspecified: Secondary | ICD-10-CM | POA: Diagnosis not present

## 2017-09-16 DIAGNOSIS — M797 Fibromyalgia: Secondary | ICD-10-CM

## 2017-09-16 DIAGNOSIS — F329 Major depressive disorder, single episode, unspecified: Secondary | ICD-10-CM

## 2017-09-16 DIAGNOSIS — F419 Anxiety disorder, unspecified: Secondary | ICD-10-CM

## 2017-09-16 MED ORDER — METHYLPREDNISOLONE ACETATE 80 MG/ML IJ SUSP
80.0000 mg | Freq: Once | INTRAMUSCULAR | Status: AC
  Administered 2017-09-16: 80 mg via INTRAMUSCULAR

## 2017-09-16 NOTE — Assessment & Plan Note (Signed)
Controlled on Protonix Will try to get copy of UGI for review

## 2017-09-16 NOTE — Progress Notes (Signed)
HPI  Seasonal Allergies: Worse in the fall. But she reports she has headaches, facial pressure, nasal congestion, ear pain, sore throat and cough. This started 4-5 days ago. She is blowing yellow mucous out of her nose. She describes the ear pain as pressure. She denies difficulty swallowing. The cough is non productive. She denies fever, chills or body aches. She has been taking Mucinex, Sudafed and Tylenol with minimal relief.   Ostoearthritis: Mainly in her hands and feet. She takes Tylenol as needed with good relief.  Anxiety and Depression: She is taking Celexa, but reports this is mainly for her fibromyalgia. She denies  SI/HI.  Fibromyaogia: Chronic but stable on Lyrica and Tylenol. She is trying to get into a routine exercise program, but reports she could be better about it.  HLD: There is no lipid panel on file. She is not taking anything to lower her cholesterol at this time.  Migraines: She is not sure what triggers these. They occur about 1 x month. She takes Tylenol as needed with good relief of symptoms.  Hypothyroidism: She denies any issues on her current dose of Synthroid.  GERD: She is not sure what triggers .She takes Pantoprazole as prescribed. She has had an upper GI but can not remember the year.   Insomnia. She has a trouble falling asleep and staying asleep. She has taken Ambien since 2015.  Flu: 06/2017 Tetanus: 06/2016 Pap Smear: ? 2016 Mammogram: 12/2016 Colon Screening: 05/2014 Vision Screening: as needed Dentist: biannually  Past Medical History:  Diagnosis Date  . Allergic rhinitis   . Arthritis   . Depression   . Fibromyalgia   . Hyperlipidemia   . Ischemic colitis ([place])   . Migraine   . Thyroid disease   . UTI (lower urinary tract infection)     Current Outpatient Medications  Medication Sig Dispense Refill  . azelastine (ASTELIN) 0.1 % nasal spray Place 2 sprays into both nostrils 2 (two) times daily. Use in each nostril as directed    .  citalopram (CELEXA) 40 MG tablet TAKE 1 TABLET DAILY 90 tablet 3  . levothyroxine (SYNTHROID, LEVOTHROID) 75 MCG tablet Take 1 tablet (75 mcg total) by mouth daily before breakfast. 90 tablet 3  . montelukast (SINGULAIR) 10 MG tablet TAKE 1 TABLET AT BEDTIME 90 tablet 1  . multivitamin (ONE-A-DAY MEN'S) TABS tablet Take 1 tablet by mouth daily.    . pantoprazole (PROTONIX) 40 MG tablet TAKE 1 TABLET DAILY 90 tablet 1  . pregabalin (LYRICA) 75 MG capsule Take 1 capsule (75 mg total) by mouth 2 (two) times daily. 180 capsule 1  . vitamin B-12 (CYANOCOBALAMIN) 1000 MCG tablet Take 1,000 mcg by mouth daily.     Marland Kitchen zolpidem (AMBIEN CR) 6.25 MG CR tablet Take 1 tablet (6.25 mg total) by mouth at bedtime as needed. for sleep 90 tablet 0   No current facility-administered medications for this visit.     Allergies  Allergen Reactions  . Bactrim [Sulfamethoxazole-Trimethoprim] Hives    Family History  Problem Relation Age of Onset  . Drug abuse Sister   . Alcohol abuse Sister   . Depression Sister   . Rheum arthritis Father   . Congestive Heart Failure Father   . Depression Father   . Diabetes Father   . Hearing loss Father   . Osteoarthritis Mother   . Stroke Mother   . Aneurysm Mother   . Breast cancer Maternal Aunt   . Heart attack Maternal Grandfather   .  Diabetes Brother   . Alcohol abuse Paternal Uncle   . Arthritis Sister   . Depression Sister   . Heart disease Sister   . Crohn's disease Sister     Social History   Socioeconomic History  . Marital status: Married    Spouse name: Not on file  . Number of children: Not on file  . Years of education: Not on file  . Highest education level: Not on file  Social Needs  . Financial resource strain: Not on file  . Food insecurity - worry: Not on file  . Food insecurity - inability: Not on file  . Transportation needs - medical: Not on file  . Transportation needs - non-medical: Not on file  Occupational History  . Not on  file  Tobacco Use  . Smoking status: Never Smoker  . Smokeless tobacco: Never Used  Substance and Sexual Activity  . Alcohol use: No  . Drug use: No  . Sexual activity: Not on file  Other Topics Concern  . Not on file  Social History Narrative  . Not on file    ROS:  Constitutional: Pt reports headache. Denies fever, malaise, fatigue, or abrupt weight changes.  HEENT: Pt reports facial pressure, ear pain, nasal congestion, sore throat. Denies eye pain, eye redness,, ringing in the ears, wax buildup, runny nose, bloody nos. Respiratory: Pt reports cough. Denies difficulty breathing, shortness of breath, cough or sputum production.   Cardiovascular: Denies chest pain, chest tightness, palpitations or swelling in the hands or feet.  Gastrointestinal: Denies abdominal pain, bloating, constipation, diarrhea or blood in the stool.  GU: Denies frequency, urgency, pain with urination, blood in urine, odor or discharge. Musculoskeletal: Pt reports intermittent joint pain. Denies decrease in range of motion, difficulty with gait, muscle pain or joint swelling.  Skin: Denies redness, rashes, lesions or ulcercations.  Neurological: Denies dizziness, difficulty with memory, difficulty with speech or problems with balance and coordination.  Psych: Pt reports history of anxiety and depression. Denies SI/HI.  No other specific complaints in a complete review of systems (except as listed in HPI above).  PE:  BP 104/66   Pulse 85   Temp 98 F (36.7 C) (Oral)   Wt 131 lb (59.4 kg)   SpO2 97%   BMI 26.46 kg/m  Wt Readings from Last 3 Encounters:  09/16/17 131 lb (59.4 kg)  05/29/17 131 lb (59.4 kg)  05/01/17 129 lb 12.8 oz (58.9 kg)    General: Appears her stated age, in NAD. HEENT: Head: normal shape and size, no sinus tenderness noted; Ears: Tm's gray and intact, normal light reflex;Throat/Mouth: Teeth present, mucosa pink and moist, no lesions or ulcerations noted.  Neck: No adenopathy  noted.  Cardiovascular: Normal rate and rhythm. S1,S2 noted.  No murmur, rubs or gallops noted. No JVD or BLE edema.  Pulmonary/Chest: Normal effort and positive vesicular breath sounds. No respiratory distress. No wheezes, rales or ronchi noted.  Abdomen: Soft and nontender. Normal bowel sounds. No distention or masses noted.  Neurological: Alert and oriented.  Psychiatric: Mood and affect flat. Behavior is normal. Judgment and thought content normal.    BMET    Component Value Date/Time   NA 142 05/01/2017 1152   NA 139 05/29/2014 0516   K 4.2 05/01/2017 1152   K 3.6 05/29/2014 0516   CL 106 05/01/2017 1152   CL 104 05/29/2014 0516   CO2 30 05/01/2017 1152   CO2 24 05/29/2014 0516   GLUCOSE 86 05/01/2017  1152   GLUCOSE 130 (H) 05/29/2014 0516   BUN 9 05/01/2017 1152   BUN 7 05/29/2014 0516   CREATININE 0.82 05/01/2017 1152   CREATININE 0.96 05/29/2014 0516   CALCIUM 9.7 05/01/2017 1152   CALCIUM 8.6 05/29/2014 0516   GFRNONAA >60 06/16/2016 1828   GFRNONAA >60 05/29/2014 0516   GFRNONAA >60 06/28/2013 0511   GFRAA >60 06/16/2016 1828   GFRAA >60 05/29/2014 0516   GFRAA >60 06/28/2013 0511    Lipid Panel  No results found for: CHOL, TRIG, HDL, CHOLHDL, VLDL, LDLCALC  CBC    Component Value Date/Time   WBC 8.2 06/16/2016 1828   RBC 4.96 06/16/2016 1828   HGB 15.1 06/16/2016 1828   HGB 13.9 05/29/2014 0516   HCT 42.4 06/16/2016 1828   HCT 41.3 05/29/2014 0516   PLT 206 06/16/2016 1828   PLT 227 05/29/2014 0516   MCV 85.5 06/16/2016 1828   MCV 89 05/29/2014 0516   MCH 30.4 06/16/2016 1828   MCHC 35.5 06/16/2016 1828   RDW 12.8 06/16/2016 1828   RDW 13.2 05/29/2014 0516   LYMPHSABS 1.9 12/30/2014 1425   LYMPHSABS 1.8 05/29/2014 0516   MONOABS 0.7 12/30/2014 1425   MONOABS 1.1 (H) 05/29/2014 0516   EOSABS 0.2 12/30/2014 1425   EOSABS 0.2 05/29/2014 0516   BASOSABS 0.1 12/30/2014 1425   BASOSABS 0.1 05/29/2014 0516    Hgb A1C No results found for:  HGBA1C   Assessment and Plan:  Viral Sinusitis:  Can use a Neti Pot OTC Start Flonase in pm, continue Astelin in am Continue Singulair  80 mg Depo IM today  Make an appt for your annual exam [name], NP

## 2017-09-16 NOTE — Assessment & Plan Note (Signed)
Continue Singulair and Astelin

## 2017-09-16 NOTE — Assessment & Plan Note (Signed)
On Ambien, she is not interested in trying to wean off this medication at this time

## 2017-09-16 NOTE — Assessment & Plan Note (Signed)
Controlled on Celexa She has seen a therapist in the past and does not feel like she needs to see one at this time Will monitor

## 2017-09-16 NOTE — Assessment & Plan Note (Signed)
Intermittent She is not sure what her triggers are, advised her to keep a headache diary to try to figure this out Continue Tylenol as needed

## 2017-09-16 NOTE — Patient Instructions (Signed)

## 2017-09-16 NOTE — Addendum Note (Signed)
Addended by: Lurlean Nanny on: 09/16/2017 04:25 PM   Modules accepted: Orders

## 2017-09-16 NOTE — Assessment & Plan Note (Signed)
Chronic but controlled with Lyrica and Tylenol Advised her to get routine exercise

## 2017-09-16 NOTE — Assessment & Plan Note (Signed)
Continue Synthroid for now Will check TSH and Free T4 with annual exam

## 2017-09-18 ENCOUNTER — Telehealth: Payer: Self-pay | Admitting: Internal Medicine

## 2017-09-18 NOTE — Telephone Encounter (Signed)
Pt. in office Tuesday; Flonase was recommended, but hasn't been ordered yet; Pharmacy is [facility] [address] In [place].  Thanks.   Per office note of 09/16/17:  Assessment and Plan:  Viral Sinusitis:  Can use a Neti Pot OTC Start Flonase in pm, continue Astelin in am Continue Singulair  80 mg Depo IM today  Make an appt for your annual exam [name], NP

## 2017-09-18 NOTE — Telephone Encounter (Signed)
Copied from [place]. Topic: Quick Communication - See Telephone Encounter >> Sep 18, 2017  3:28 PM Boyd Kerbs wrote: CRM for notification. See Telephone encounter for:    Pt. Called seen [facility] and was told she would call in prescription for flonaze.    [facility]  Please call in to: [facility] [address], [place] - [place] [address] Phone: [phone] Fax: [phone]   09/18/17.

## 2017-09-18 NOTE — Telephone Encounter (Signed)
I notified pt that Flonase is OTC and pt voiced understanding and will pick up at pharmacy.

## 2017-10-29 ENCOUNTER — Ambulatory Visit: Payer: Self-pay | Admitting: Family Medicine

## 2017-11-11 ENCOUNTER — Encounter: Admitting: Internal Medicine

## 2018-01-01 ENCOUNTER — Ambulatory Visit (INDEPENDENT_AMBULATORY_CARE_PROVIDER_SITE_OTHER): Admitting: Internal Medicine

## 2018-01-01 ENCOUNTER — Encounter: Payer: Self-pay | Admitting: Internal Medicine

## 2018-01-01 VITALS — BP 106/72 | HR 94 | Temp 98.2°F | Wt 136.0 lb

## 2018-01-01 DIAGNOSIS — J029 Acute pharyngitis, unspecified: Secondary | ICD-10-CM | POA: Diagnosis not present

## 2018-01-01 DIAGNOSIS — J301 Allergic rhinitis due to pollen: Secondary | ICD-10-CM

## 2018-01-01 LAB — POCT RAPID STREP A (OFFICE): Rapid Strep A Screen: NEGATIVE

## 2018-01-01 MED ORDER — PREDNISONE 10 MG PO TABS: ORAL_TABLET | ORAL | 0 refills | Status: DC

## 2018-01-01 NOTE — Progress Notes (Signed)
HPI  Pt presents to the clinic today with c/o nasal congestion, sore throat and cough. She reports this started 1 week ago. She is not blowing anything out of her nose. She is having some difficulty swallowing. The cough is non productive. She denies fever, chills or body aches. She has tried Astelin, Singulair without any relief. She reports she went to [place] on Saturday. Flu and rapid strep was negative. They diagnosed her with a viral illness and prescribed Tessalon which she has been taking without relief. She does have a history of allergies. She has not had sick contacts that she is aware of.  Review of Systems     Past Medical History:  Diagnosis Date  . Allergic rhinitis   . Arthritis   . Depression   . Fibromyalgia   . Hyperlipidemia   . Ischemic colitis ([place])   . Migraine   . Thyroid disease   . UTI (lower urinary tract infection)     Family History  Problem Relation Age of Onset  . Drug abuse Sister   . Alcohol abuse Sister   . Depression Sister   . Rheum arthritis Father   . Congestive Heart Failure Father   . Depression Father   . Diabetes Father   . Hearing loss Father   . Osteoarthritis Mother   . Stroke Mother   . Aneurysm Mother   . Breast cancer Maternal Aunt   . Heart attack Maternal Grandfather   . Diabetes Brother   . Alcohol abuse Paternal Uncle   . Arthritis Sister   . Depression Sister   . Heart disease Sister   . Crohn's disease Sister     Social History   Socioeconomic History  . Marital status: Married    Spouse name: Not on file  . Number of children: Not on file  . Years of education: Not on file  . Highest education level: Not on file  Occupational History  . Not on file  Social Needs  . Financial resource strain: Not on file  . Food insecurity:    Worry: Not on file    Inability: Not on file  . Transportation needs:    Medical: Not on file    Non-medical: Not on file  Tobacco Use  . Smoking status: Never Smoker  .  Smokeless tobacco: Never Used  Substance and Sexual Activity  . Alcohol use: No  . Drug use: No  . Sexual activity: Not on file  Lifestyle  . Physical activity:    Days per week: Not on file    Minutes per session: Not on file  . Stress: Not on file  Relationships  . Social connections:    Talks on phone: Not on file    Gets together: Not on file    Attends religious service: Not on file    Active member of club or organization: Not on file    Attends meetings of clubs or organizations: Not on file    Relationship status: Not on file  . Intimate partner violence:    Fear of current or ex partner: Not on file    Emotionally abused: Not on file    Physically abused: Not on file    Forced sexual activity: Not on file  Other Topics Concern  . Not on file  Social History Narrative  . Not on file    Allergies  Allergen Reactions  . Bactrim [Sulfamethoxazole-Trimethoprim] Hives     Constitutional:  Denies headache, fever  or abrupt weight changes.  HEENT:  Positive nasal congestion and sore throat. Denies eye redness, ear pain, ringing in the ears, wax buildup, runny nose or bloody nose. Respiratory: Positive cough. Denies difficulty breathing or shortness of breath.  Cardiovascular: Denies chest pain, chest tightness, palpitations or swelling in the hands or feet.   No other specific complaints in a complete review of systems (except as listed in HPI above).  Objective:   BP 106/72   Pulse 94   Temp 98.2 F (36.8 C) (Oral)   Wt 136 lb (61.7 kg)   SpO2 98%   BMI 27.47 kg/m   General: Appears her stated age, in NAD. HEENT: Head: normal shape and size, mild maxillary sinus tenderness noted; Ears: Tm's gray and intact, normal light reflex; Nose: mucosa pink and moist, septum midline; Throat/Mouth: + PND. Teeth present, mucosa erythematous and moist, no exudate noted, no lesions or ulcerations noted.  Neck:  No adenopathy noted.  Cardiovascular: Normal rate and rhythm.  S1,S2 noted.  No murmur, rubs or gallops noted.  Pulmonary/Chest: Normal effort and positive vesicular breath sounds. No respiratory distress. No wheezes, rales or ronchi noted.       Assessment & Plan:   Allergic Rhinitis, Sore Throat:  RST: negative Will send throat culture per pt request Continue Zyrtec and Flonase eRx for Pred Taper x 6 days  RTC as needed or if symptoms persist. [name], NP

## 2018-01-01 NOTE — Patient Instructions (Signed)

## 2018-01-03 LAB — CULTURE, GROUP A STREP
MICRO NUMBER:: 90628021
SPECIMEN QUALITY: ADEQUATE

## 2018-01-11 ENCOUNTER — Other Ambulatory Visit: Payer: Self-pay | Admitting: Family Medicine

## 2018-01-16 LAB — HM MAMMOGRAPHY

## 2018-01-22 ENCOUNTER — Encounter: Payer: Self-pay | Admitting: Internal Medicine

## 2018-02-02 ENCOUNTER — Telehealth: Payer: Self-pay

## 2018-02-02 NOTE — Telephone Encounter (Signed)
Pt is aware as instructed 

## 2018-02-02 NOTE — Telephone Encounter (Signed)
Copied from [place] [id number]. Topic: General - Other >> Feb 02, 2018  9:05 AM Synthia Innocent wrote: Reason for CRM: Requesting labs prior to appt on 02/03/18 for weight gain, 7lbs in 30 days, concerned for thyroid. Please advise

## 2018-02-02 NOTE — Telephone Encounter (Signed)
[facility] does not do labs before office visit

## 2018-02-03 ENCOUNTER — Ambulatory Visit (INDEPENDENT_AMBULATORY_CARE_PROVIDER_SITE_OTHER): Admitting: Internal Medicine

## 2018-02-03 ENCOUNTER — Encounter: Payer: Self-pay | Admitting: Internal Medicine

## 2018-02-03 VITALS — BP 106/74 | HR 87 | Temp 97.9°F | Wt 142.0 lb

## 2018-02-03 DIAGNOSIS — R5383 Other fatigue: Secondary | ICD-10-CM | POA: Diagnosis not present

## 2018-02-03 DIAGNOSIS — K219 Gastro-esophageal reflux disease without esophagitis: Secondary | ICD-10-CM

## 2018-02-03 DIAGNOSIS — E039 Hypothyroidism, unspecified: Secondary | ICD-10-CM

## 2018-02-03 DIAGNOSIS — F329 Major depressive disorder, single episode, unspecified: Secondary | ICD-10-CM

## 2018-02-03 DIAGNOSIS — F419 Anxiety disorder, unspecified: Secondary | ICD-10-CM

## 2018-02-03 DIAGNOSIS — R635 Abnormal weight gain: Secondary | ICD-10-CM | POA: Diagnosis not present

## 2018-02-03 DIAGNOSIS — R1310 Dysphagia, unspecified: Secondary | ICD-10-CM

## 2018-02-03 DIAGNOSIS — J301 Allergic rhinitis due to pollen: Secondary | ICD-10-CM

## 2018-02-03 DIAGNOSIS — R0981 Nasal congestion: Secondary | ICD-10-CM

## 2018-02-03 NOTE — Progress Notes (Signed)
Subjective:    Patient ID: Grace Shepard, female    DOB: [birth year], 52 y.o.   MRN: [id number]  HPI  Pt presents to the clinic today with c/o fatigue, weight gain, increased appetite. She noticed these symptoms 4-6 weeks ago. 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 Because of that, she is less active but denies a change in her diet. She is sleeping well at night. She thinks her thyroid levels might be off and would like that checked today. She is taking her Synthroid as prescribed.   She also wants to let me know that she tried to back off Citalopram for the last 10 days. She reports it is not going well and she plans to go back up to her regular dose. She denies SI/HI.  She also reports increase in difficulty swallowing. She has had to have her esophagus stretched in the past by GI. She does not feel like she is getting choked on food or drinks. She is taking Pantoprazole as prescribed.   She also c/o nasal congestion and a metallic taste in her mouth. She noticed this 2-3 days ago. She is not able to blow anything out of her nose. She denies runny nose, ear pain, sore throat or cough. She denies fever chills or body aches. She is taking Zyrtec and Singulair as prescribed. She has not had sick contacts.   Review of Systems      Past Medical History:  Diagnosis Date  . Allergic rhinitis   . Arthritis   . Depression   . Fibromyalgia   . Hyperlipidemia   . Ischemic colitis ([place])   . Migraine   . Thyroid disease   . UTI (lower urinary tract infection)     Current Outpatient Medications  Medication Sig Dispense Refill  . azelastine (ASTELIN) 0.1 % nasal spray Place 2 sprays into both nostrils 2 (two) times daily. Use in each nostril as directed    . cetirizine (ZYRTEC) 10 MG tablet Take 10 mg by mouth daily.    . Cholecalciferol (D 1000) 1000 units capsule Take by mouth.    . citalopram (CELEXA) 40 MG tablet TAKE 1 TABLET DAILY (Patient taking differently:  TAKE 3/4 of TABLET DAILY) 90 tablet 3  . levothyroxine (SYNTHROID, LEVOTHROID) 75 MCG tablet Take 1 tablet (75 mcg total) by mouth daily before breakfast. 90 tablet 3  . montelukast (SINGULAIR) 10 MG tablet TAKE 1 TABLET AT BEDTIME 90 tablet 1  . multivitamin (ONE-A-DAY MEN'S) TABS tablet Take 1 tablet by mouth daily.    . nortriptyline (PAMELOR) 10 MG capsule     . pantoprazole (PROTONIX) 40 MG tablet TAKE 1 TABLET DAILY 90 tablet 1  . predniSONE (DELTASONE) 10 MG tablet Take 3 tabs on days 1-2, take 2 tabs on days 3-4, take 1 tab on days 5-6 12 tablet 0  . pregabalin (LYRICA) 75 MG capsule Take 1 capsule (75 mg total) by mouth 2 (two) times daily. 180 capsule 1  . vitamin B-12 (CYANOCOBALAMIN) 1000 MCG tablet Take 1,000 mcg by mouth daily.     Marland Kitchen zolpidem (AMBIEN CR) 6.25 MG CR tablet Take 1 tablet (6.25 mg total) by mouth at bedtime as needed. for sleep 90 tablet 0   No current facility-administered medications for this visit.     Allergies  Allergen Reactions  . Bactrim [Sulfamethoxazole-Trimethoprim] Hives    Family History  Problem Relation Age of Onset  . Drug abuse Sister   .  Alcohol abuse Sister   . Depression Sister   . Rheum arthritis Father   . Congestive Heart Failure Father   . Depression Father   . Diabetes Father   . Hearing loss Father   . Osteoarthritis Mother   . Stroke Mother   . Aneurysm Mother   . Breast cancer Maternal Aunt   . Heart attack Maternal Grandfather   . Diabetes Brother   . Alcohol abuse Paternal Uncle   . Arthritis Sister   . Depression Sister   . Heart disease Sister   . Crohn's disease Sister     Social History   Socioeconomic History  . Marital status: Married    Spouse name: Not on file  . Number of children: Not on file  . Years of education: Not on file  . Highest education level: Not on file  Occupational History  . Not on file  Social Needs  . Financial resource strain: Not on file  . Food insecurity:    Worry: Not on  file    Inability: Not on file  . Transportation needs:    Medical: Not on file    Non-medical: Not on file  Tobacco Use  . Smoking status: Never Smoker  . Smokeless tobacco: Never Used  Substance and Sexual Activity  . Alcohol use: No  . Drug use: No  . Sexual activity: Not on file  Lifestyle  . Physical activity:    Days per week: Not on file    Minutes per session: Not on file  . Stress: Not on file  Relationships  . Social connections:    Talks on phone: Not on file    Gets together: Not on file    Attends religious service: Not on file    Active member of club or organization: Not on file    Attends meetings of clubs or organizations: Not on file    Relationship status: Not on file  . Intimate partner violence:    Fear of current or ex partner: Not on file    Emotionally abused: Not on file    Physically abused: Not on file    Forced sexual activity: Not on file  Other Topics Concern  . Not on file  Social History Narrative  . Not on file     Constitutional: Pt reports fatigue, weight gain. Denies fever, malaise,  headache.  HEENT: Pt reports nasal congestion. Denies eye pain, eye redness, ear pain, ringing in the ears, wax buildup, runny nose, bloody nose, or sore throat. Respiratory: Denies difficulty breathing, shortness of breath, cough or sputum production.   Cardiovascular: Denies chest pain, chest tightness, palpitations or swelling in the hands or feet.  Gastrointestinal: Pt reports difficulty swallowing. Denies abdominal pain, bloating, constipation, diarrhea or blood in the stool.  Neurological: Denies dizziness, difficulty with memory, difficulty with speech or problems with balance and coordination.  Psych: Pt reports stress, anxiety and depression. Denies SI/HI.  No other specific complaints in a complete review of systems (except as listed in HPI above).  Objective:   Physical Exam   BP 106/74   Pulse 87   Temp 97.9 F (36.6 C) (Oral)   Wt 142  lb (64.4 kg)   SpO2 98%   BMI 28.68 kg/m   Wt Readings from Last 3 Encounters:  02/03/18 142 lb (64.4 kg)  01/01/18 136 lb (61.7 kg)  09/16/17 131 lb (59.4 kg)    General: Appears her stated age, well developed, well nourished in  NAD. HEENT: Head: normal shape and size, no sinus tenderness noted;  Ears: Tm's gray and intact, normal light reflex; Nose: mucosa boggy and moist, turbinates swollen; Throat/Mouth: Teeth present, mucosa pink and moist, + PND, no exudate, lesions or ulcerations noted.  Neck:  No adenopathy noted. Cardiovascular: Normal rate and rhythm. S1,S2 noted.  No murmur, rubs or gallops noted.  Pulmonary/Chest: Normal effort and positive vesicular breath sounds. No respiratory distress. No wheezes, rales or ronchi noted.  Abdomen: Soft and nontender. Normal bowel sounds. No distention or masses noted.  Neurological: Alert and oriented.  Psychiatric: Mood and affect normal. Behavior is normal. Judgment and thought content normal.    BMET    Component Value Date/Time   NA 142 05/01/2017 1152   NA 139 05/29/2014 0516   K 4.2 05/01/2017 1152   K 3.6 05/29/2014 0516   CL 106 05/01/2017 1152   CL 104 05/29/2014 0516   CO2 30 05/01/2017 1152   CO2 24 05/29/2014 0516   GLUCOSE 86 05/01/2017 1152   GLUCOSE 130 (H) 05/29/2014 0516   BUN 9 05/01/2017 1152   BUN 7 05/29/2014 0516   CREATININE 0.82 05/01/2017 1152   CREATININE 0.96 05/29/2014 0516   CALCIUM 9.7 05/01/2017 1152   CALCIUM 8.6 05/29/2014 0516   GFRNONAA >60 06/16/2016 1828   GFRNONAA >60 05/29/2014 0516   GFRNONAA >60 06/28/2013 0511   GFRAA >60 06/16/2016 1828   GFRAA >60 05/29/2014 0516   GFRAA >60 06/28/2013 0511    Lipid Panel  No results found for: CHOL, TRIG, HDL, CHOLHDL, VLDL, LDLCALC  CBC    Component Value Date/Time   WBC 8.2 06/16/2016 1828   RBC 4.96 06/16/2016 1828   HGB 15.1 06/16/2016 1828   HGB 13.9 05/29/2014 0516   HCT 42.4 06/16/2016 1828   HCT 41.3 05/29/2014 0516   PLT  206 06/16/2016 1828   PLT 227 05/29/2014 0516   MCV 85.5 06/16/2016 1828   MCV 89 05/29/2014 0516   MCH 30.4 06/16/2016 1828   MCHC 35.5 06/16/2016 1828   RDW 12.8 06/16/2016 1828   RDW 13.2 05/29/2014 0516   LYMPHSABS 1.9 12/30/2014 1425   LYMPHSABS 1.8 05/29/2014 0516   MONOABS 0.7 12/30/2014 1425   MONOABS 1.1 (H) 05/29/2014 0516   EOSABS 0.2 12/30/2014 1425   EOSABS 0.2 05/29/2014 0516   BASOSABS 0.1 12/30/2014 1425   BASOSABS 0.1 05/29/2014 0516    Hgb A1C No results found for: HGBA1C        Assessment & Plan:   Fatigue and Weight Gain:  Will check TSH, Free T4, Vit D and B12 today  Nasal Congestion:  Continue Zyrtec and Singulair Add in Flonase  GERD, Difficulty Swallowing:  Continue Pantoprazole Make an appt with GI for further evaluation  Anxiety and Depression:  Continue Celexa as prescribed  Will follow up after labs, return precautions discussed [name], NP

## 2018-02-04 LAB — T4, FREE: FREE T4: 0.9 ng/dL (ref 0.60–1.60)

## 2018-02-04 LAB — VITAMIN D 25 HYDROXY (VIT D DEFICIENCY, FRACTURES): VITD: 28.99 ng/mL — ABNORMAL LOW (ref 30.00–100.00)

## 2018-02-04 LAB — VITAMIN B12: Vitamin B-12: 1209 pg/mL — ABNORMAL HIGH (ref 211–911)

## 2018-02-04 LAB — TSH: TSH: 0.3 u[IU]/mL — AB (ref 0.35–4.50)

## 2018-02-06 MED ORDER — LEVOTHYROXINE SODIUM 50 MCG PO TABS: 50.0000 ug | ORAL_TABLET | Freq: Every day | ORAL | 0 refills | Status: DC

## 2018-02-09 ENCOUNTER — Encounter: Payer: Self-pay | Admitting: Internal Medicine

## 2018-02-09 NOTE — Patient Instructions (Signed)

## 2018-02-18 ENCOUNTER — Other Ambulatory Visit (HOSPITAL_COMMUNITY)
Admission: RE | Admit: 2018-02-18 | Discharge: 2018-02-18 | Disposition: A | Discharge status: Home / Self Care | Source: Ambulatory Visit | Attending: Internal Medicine | Admitting: Internal Medicine

## 2018-02-18 ENCOUNTER — Encounter: Payer: Self-pay | Admitting: Internal Medicine

## 2018-02-18 ENCOUNTER — Ambulatory Visit (INDEPENDENT_AMBULATORY_CARE_PROVIDER_SITE_OTHER): Admitting: Internal Medicine

## 2018-02-18 ENCOUNTER — Encounter

## 2018-02-18 VITALS — BP 104/68 | HR 91 | Temp 98.1°F | Ht <= 58 in | Wt 161.0 lb

## 2018-02-18 DIAGNOSIS — Z124 Encounter for screening for malignant neoplasm of cervix: Secondary | ICD-10-CM | POA: Diagnosis not present

## 2018-02-18 DIAGNOSIS — Z0000 Encounter for general adult medical examination without abnormal findings: Secondary | ICD-10-CM

## 2018-02-18 DIAGNOSIS — E782 Mixed hyperlipidemia: Secondary | ICD-10-CM | POA: Diagnosis not present

## 2018-02-18 DIAGNOSIS — E559 Vitamin D deficiency, unspecified: Secondary | ICD-10-CM | POA: Diagnosis not present

## 2018-02-18 MED ORDER — PREGABALIN 75 MG PO CAPS: 75.0000 mg | ORAL_CAPSULE | Freq: Two times a day (BID) | ORAL | 2 refills | Status: DC

## 2018-02-18 MED ORDER — MONTELUKAST SODIUM 10 MG PO TABS: 10.0000 mg | ORAL_TABLET | Freq: Every day | ORAL | 2 refills | Status: DC

## 2018-02-18 MED ORDER — PANTOPRAZOLE SODIUM 40 MG PO TBEC: 40.0000 mg | DELAYED_RELEASE_TABLET | Freq: Every day | ORAL | 2 refills | Status: AC

## 2018-02-18 MED ORDER — CITALOPRAM HYDROBROMIDE 40 MG PO TABS: 40.0000 mg | ORAL_TABLET | Freq: Every day | ORAL | 2 refills | Status: DC

## 2018-02-18 NOTE — Patient Instructions (Signed)

## 2018-02-18 NOTE — Progress Notes (Signed)
Subjective:    Patient ID: Grace Shepard, female    DOB: October 31, 1965, 52 y.o.   MRN: [id number]  HPI  Pt presents to the clinic today for her annual exam.  Flu: 06/2017 Tetanus: 06/2016 Pap Smear: ? 2019 Mammogram: 01/2018 Colon Screening: 2015, 10 years Vision Screening: as needed Dentist: [email or web address]  Diet: She does eat meat. She consumes fruits and veggies daily. She does eat fried foods. She drinks mostly Dr. Malachi Bonds and water. Exercise: None  Review of Systems      Past Medical History:  Diagnosis Date  . Allergic rhinitis   . Arthritis   . Depression   . Fibromyalgia   . Hyperlipidemia   . Ischemic colitis ([place])   . Migraine   . Thyroid disease   . UTI (lower urinary tract infection)     Current Outpatient Medications  Medication Sig Dispense Refill  . azelastine (ASTELIN) 0.1 % nasal spray Place 2 sprays into both nostrils 2 (two) times daily. Use in each nostril as directed    . cetirizine (ZYRTEC) 10 MG tablet Take 10 mg by mouth daily.    . Cholecalciferol (D 1000) 1000 units capsule Take by mouth.    . citalopram (CELEXA) 40 MG tablet TAKE 1 TABLET DAILY (Patient taking differently: TAKE 3/4 of TABLET DAILY) 90 tablet 3  . levothyroxine (SYNTHROID, LEVOTHROID) 50 MCG tablet Take 1 tablet (50 mcg total) by mouth daily. 30 tablet 0  . montelukast (SINGULAIR) 10 MG tablet TAKE 1 TABLET AT BEDTIME 90 tablet 1  . multivitamin (ONE-A-DAY MEN'S) TABS tablet Take 1 tablet by mouth daily.    . nortriptyline (PAMELOR) 10 MG capsule     . pantoprazole (PROTONIX) 40 MG tablet TAKE 1 TABLET DAILY 90 tablet 1  . predniSONE (DELTASONE) 10 MG tablet Take 3 tabs on days 1-2, take 2 tabs on days 3-4, take 1 tab on days 5-6 12 tablet 0  . pregabalin (LYRICA) 75 MG capsule Take 1 capsule (75 mg total) by mouth 2 (two) times daily. 180 capsule 1  . vitamin B-12 (CYANOCOBALAMIN) 1000 MCG tablet Take 1,000 mcg by mouth daily.     Marland Kitchen zolpidem (AMBIEN CR) 6.25 MG CR tablet Take 1  tablet (6.25 mg total) by mouth at bedtime as needed. for sleep 90 tablet 0   No current facility-administered medications for this visit.     Allergies  Allergen Reactions  . Bactrim [Sulfamethoxazole-Trimethoprim] Hives    Family History  Problem Relation Age of Onset  . Drug abuse Sister   . Alcohol abuse Sister   . Depression Sister   . Rheum arthritis Father   . Congestive Heart Failure Father   . Depression Father   . Diabetes Father   . Hearing loss Father   . Osteoarthritis Mother   . Stroke Mother   . Aneurysm Mother   . Breast cancer Maternal Aunt   . Heart attack Maternal Grandfather   . Diabetes Brother   . Alcohol abuse Paternal Uncle   . Arthritis Sister   . Depression Sister   . Heart disease Sister   . Crohn's disease Sister     Social History   Socioeconomic History  . Marital status: Married    Spouse name: Not on file  . Number of children: Not on file  . Years of education: Not on file  . Highest education level: Not on file  Occupational History  . Not on file  Social Needs  . Emergency planning/management officer  strain: Not on file  . Food insecurity:    Worry: Not on file    Inability: Not on file  . Transportation needs:    Medical: Not on file    Non-medical: Not on file  Tobacco Use  . Smoking status: Never Smoker  . Smokeless tobacco: Never Used  Substance and Sexual Activity  . Alcohol use: No  . Drug use: No  . Sexual activity: Not on file  Lifestyle  . Physical activity:    Days per week: Not on file    Minutes per session: Not on file  . Stress: Not on file  Relationships  . Social connections:    Talks on phone: Not on file    Gets together: Not on file    Attends religious service: Not on file    Active member of club or organization: Not on file    Attends meetings of clubs or organizations: Not on file    Relationship status: Not on file  . Intimate partner violence:    Fear of current or ex partner: Not on file    Emotionally  abused: Not on file    Physically abused: Not on file    Forced sexual activity: Not on file  Other Topics Concern  . Not on file  Social History Narrative  . Not on file     Constitutional: Denies fever, malaise, fatigue, headache or abrupt weight changes.  HEENT: Denies eye pain, eye redness, ear pain, ringing in the ears, wax buildup, runny nose, nasal congestion, bloody nose, or sore throat. Respiratory: Denies difficulty breathing, shortness of breath, cough or sputum production.   Cardiovascular: Denies chest pain, chest tightness, palpitations or swelling in the hands or feet.  Gastrointestinal: Denies abdominal pain, bloating, constipation, diarrhea or blood in the stool.  GU: Denies urgency, frequency, pain with urination, burning sensation, blood in urine, odor or discharge. Musculoskeletal: Denies decrease in range of motion, difficulty with gait, muscle pain or joint pain and swelling.  Skin: Denies redness, rashes, lesions or ulcercations.  Neurological: Denies dizziness, difficulty with memory, difficulty with speech or problems with balance and coordination.  Psych: Pt has history of depression. Denies anxiety, SI/HI.  No other specific complaints in a complete review of systems (except as listed in HPI above).  Objective:   Physical Exam BP 104/68   Pulse 91   Temp 98.1 F (36.7 C) (Oral)   Ht 4\' 10"  (1.473 m)   Wt 161 lb (73 kg)   SpO2 98%   BMI 33.65 kg/m  Wt Readings from Last 3 Encounters:  02/18/18 161 lb (73 kg)  02/03/18 142 lb (64.4 kg)  01/01/18 136 lb (61.7 kg)    General: Appears her stated age, well developed, well nourished in NAD. Skin: Warm, dry and intact.  HEENT: Head: normal shape and size; Eyes: sclera white, no icterus, conjunctiva pink, PERRLA and EOMs intact; Ears: Tm's gray and intact, normal light reflex;Throat/Mouth: Teeth present, mucosa pink and moist, no exudate, lesions or ulcerations noted.  Neck:  Neck supple, trachea midline.  No masses, lumpspresent.  Cardiovascular: Normal rate and rhythm. S1,S2 noted.  No murmur, rubs or gallops noted. No JVD or BLE edema.  Pulmonary/Chest: Normal effort and positive vesicular breath sounds. No respiratory distress. No wheezes, rales or ronchi noted.  Abdomen: Soft and nontender. Normal bowel sounds. No distention or masses noted. Liver, spleen and kidneys non palpable. Pelvic: Normal female anatomy. Cervix without changes. No discharge. Urine odor noted. Adnexa  non palpable.  Musculoskeletal: Strength 5/5BUE/BLE. No difficulty with gait.  Neurological: Alert and oriented. Cranial nerves II-XII grossly intact. Coordination normal.  Psychiatric: Mood and affect normal. Behavior is normal. Judgment and thought content normal.     BMET    Component Value Date/Time   NA 142 05/01/2017 1152   NA 139 05/29/2014 0516   K 4.2 05/01/2017 1152   K 3.6 05/29/2014 0516   CL 106 05/01/2017 1152   CL 104 05/29/2014 0516   CO2 30 05/01/2017 1152   CO2 24 05/29/2014 0516   GLUCOSE 86 05/01/2017 1152   GLUCOSE 130 (H) 05/29/2014 0516   BUN 9 05/01/2017 1152   BUN 7 05/29/2014 0516   CREATININE 0.82 05/01/2017 1152   CREATININE 0.96 05/29/2014 0516   CALCIUM 9.7 05/01/2017 1152   CALCIUM 8.6 05/29/2014 0516   GFRNONAA >60 06/16/2016 1828   GFRNONAA >60 05/29/2014 0516   GFRNONAA >60 06/28/2013 0511   GFRAA >60 06/16/2016 1828   GFRAA >60 05/29/2014 0516   GFRAA >60 06/28/2013 0511    Lipid Panel  No results found for: CHOL, TRIG, HDL, CHOLHDL, VLDL, LDLCALC  CBC    Component Value Date/Time   WBC 8.2 06/16/2016 1828   RBC 4.96 06/16/2016 1828   HGB 15.1 06/16/2016 1828   HGB 13.9 05/29/2014 0516   HCT 42.4 06/16/2016 1828   HCT 41.3 05/29/2014 0516   PLT 206 06/16/2016 1828   PLT 227 05/29/2014 0516   MCV 85.5 06/16/2016 1828   MCV 89 05/29/2014 0516   MCH 30.4 06/16/2016 1828   MCHC 35.5 06/16/2016 1828   RDW 12.8 06/16/2016 1828   RDW 13.2 05/29/2014 0516    LYMPHSABS 1.9 12/30/2014 1425   LYMPHSABS 1.8 05/29/2014 0516   MONOABS 0.7 12/30/2014 1425   MONOABS 1.1 (H) 05/29/2014 0516   EOSABS 0.2 12/30/2014 1425   EOSABS 0.2 05/29/2014 0516   BASOSABS 0.1 12/30/2014 1425   BASOSABS 0.1 05/29/2014 0516    Hgb A1C No results found for: HGBA1C           Assessment & Plan:   Preventative Health Maintenance:  Encouraged her to get a flu shot in the fall Tetanus UTD Pap smear today- she declines STD screening Mammogram UTD Colonoscopy due 2025 Encouraged her to consume a balanced diet and exercise regimen Advised her to see an eye doctor and dentist annually Will check CBC, CMET, Lipid, TSH, Vit D today  RTC in 1 year, sooner if needed [name], NP

## 2018-02-18 NOTE — Addendum Note (Signed)
Addended by: Lurlean Nanny on: 02/18/2018 04:16 PM   Modules accepted: Orders

## 2018-02-19 LAB — CBC
HCT: 40.9 % (ref 36.0–46.0)
Hemoglobin: 13.8 g/dL (ref 12.0–15.0)
MCHC: 33.7 g/dL (ref 30.0–36.0)
MCV: 86.7 fl (ref 78.0–100.0)
Platelets: 302 K/uL (ref 150.0–400.0)
RBC: 4.72 Mil/uL (ref 3.87–5.11)
RDW: 13.4 % (ref 11.5–15.5)
WBC: 8 K/uL (ref 4.0–10.5)

## 2018-02-19 LAB — COMPREHENSIVE METABOLIC PANEL WITH GFR
ALT: 23 U/L (ref 0–35)
AST: 24 U/L (ref 0–37)
Albumin: 4.2 g/dL (ref 3.5–5.2)
Alkaline Phosphatase: 107 U/L (ref 39–117)
BUN: 10 mg/dL (ref 6–23)
CO2: 28 meq/L (ref 19–32)
Calcium: 9.7 mg/dL (ref 8.4–10.5)
Chloride: 103 meq/L (ref 96–112)
Creatinine, Ser: 0.88 mg/dL (ref 0.40–1.20)
GFR: 72.32 mL/min
Glucose, Bld: 77 mg/dL (ref 70–99)
Potassium: 3.7 meq/L (ref 3.5–5.1)
Sodium: 141 meq/L (ref 135–145)
Total Bilirubin: 0.3 mg/dL (ref 0.2–1.2)
Total Protein: 7.1 g/dL (ref 6.0–8.3)

## 2018-02-19 LAB — VITAMIN D 25 HYDROXY (VIT D DEFICIENCY, FRACTURES): VITD: 26.09 ng/mL — AB (ref 30.00–100.00)

## 2018-02-19 LAB — LIPID PANEL
Cholesterol: 246 mg/dL — ABNORMAL HIGH (ref 0–200)
HDL: 58.7 mg/dL
NonHDL: 187.24
Total CHOL/HDL Ratio: 4
Triglycerides: 228 mg/dL — ABNORMAL HIGH (ref 0.0–149.0)
VLDL: 45.6 mg/dL — ABNORMAL HIGH (ref 0.0–40.0)

## 2018-02-19 LAB — TSH: TSH: 0.58 u[IU]/mL (ref 0.35–4.50)

## 2018-02-19 LAB — LDL CHOLESTEROL, DIRECT: LDL DIRECT: 160 mg/dL

## 2018-02-20 LAB — CYTOLOGY - PAP
Diagnosis: NEGATIVE
HPV (WINDOPATH): NOT DETECTED

## 2018-02-23 ENCOUNTER — Telehealth: Payer: Self-pay | Admitting: Internal Medicine

## 2018-02-23 ENCOUNTER — Other Ambulatory Visit: Payer: Self-pay

## 2018-02-23 MED ORDER — ZOLPIDEM TARTRATE ER 6.25 MG PO TBCR: 6.2500 mg | EXTENDED_RELEASE_TABLET | Freq: Every evening | ORAL | 0 refills | Status: DC | PRN

## 2018-02-23 MED ORDER — ATORVASTATIN CALCIUM 10 MG PO TABS: 10.0000 mg | ORAL_TABLET | Freq: Every day | ORAL | 2 refills | Status: DC

## 2018-02-23 MED ORDER — LEVOTHYROXINE SODIUM 50 MCG PO TABS: 50.0000 ug | ORAL_TABLET | Freq: Every day | ORAL | 2 refills | Status: DC

## 2018-02-23 NOTE — Addendum Note (Signed)
Addended by: Lurlean Nanny on: 02/23/2018 12:58 PM   Modules accepted: Orders

## 2018-02-23 NOTE — Telephone Encounter (Signed)
Filled at practice

## 2018-02-23 NOTE — Telephone Encounter (Signed)
Pt states you were supposed to send in refill of Ambien to local [facility]... Please advise

## 2018-02-23 NOTE — Telephone Encounter (Unsigned)
Copied from [place] [id number]. Topic: Quick Communication - Rx Refill/Question >> Feb 23, 2018 11:31 AM Carolyn Stare wrote: Medication   zolpidem (AMBIEN CR) 6.25 MG CR tablet   Preferred Pharmacy (   [facility]   Agent: Please be advised that RX refills may take up to 3 business days. We ask that you follow-up with your pharmacy.

## 2018-02-26 ENCOUNTER — Other Ambulatory Visit: Payer: Self-pay

## 2018-03-24 ENCOUNTER — Telehealth: Payer: Self-pay | Admitting: Internal Medicine

## 2018-03-24 NOTE — Telephone Encounter (Signed)
I do not do this and am not familiar with anyone who does.

## 2018-03-24 NOTE — Telephone Encounter (Signed)
Copied from [place] [id number]. Topic: General - Other >> Mar 24, 2018  2:29 PM Cecelia Byars, NT wrote: Reason for CRM: P[atient called and would like to know if [facility] Fare does Bio identical hormone replacement therapy , BHRT  is not does she know of any [place] Drs  or any one else that do please call [phone]

## 2018-03-25 NOTE — Telephone Encounter (Signed)
Pt is aware as instructed 

## 2018-04-09 ENCOUNTER — Telehealth: Payer: Self-pay | Admitting: Internal Medicine

## 2018-04-09 NOTE — Telephone Encounter (Signed)
Patient called and advised the requested medication refills were sent to [facility] on 02/23/18 #90/2 refills. She says she will check again with [facility] to see if she has refills available and if not, she will call back to have them sent to [facility].

## 2018-04-09 NOTE — Telephone Encounter (Signed)
Copied from [place] [id number]. Topic: Quick Communication - Rx Refill/Question >> Apr 09, 2018 12:36 PM Bea Graff, NT wrote: Medication: levothyroxine (SYNTHROID, LEVOTHROID) 50 MCG tablet  and montelukast (SINGULAIR) 10 MG tablet   Has the patient contacted their pharmacy? Yes.   (Agent: If no, request that the patient contact the pharmacy for the refill.) (Agent: If yes, when and what did the pharmacy advise?)  Preferred Pharmacy (with phone number or street name): [facility] [place] [phone] (Phone) [phone] (Fax)    Agent: Please be advised that RX refills may take up to 3 business days. We ask that you follow-up with your pharmacy.

## 2018-04-17 ENCOUNTER — Other Ambulatory Visit: Payer: Self-pay

## 2018-04-17 MED ORDER — ATORVASTATIN CALCIUM 10 MG PO TABS: 10.0000 mg | ORAL_TABLET | Freq: Every day | ORAL | 0 refills | Status: DC

## 2018-04-20 ENCOUNTER — Encounter: Payer: Self-pay | Admitting: Obstetrics and Gynecology

## 2018-04-20 ENCOUNTER — Ambulatory Visit (INDEPENDENT_AMBULATORY_CARE_PROVIDER_SITE_OTHER): Admitting: Obstetrics and Gynecology

## 2018-04-20 VITALS — BP 120/70 | HR 98 | Ht <= 58 in | Wt 149.0 lb

## 2018-04-20 DIAGNOSIS — R350 Frequency of micturition: Secondary | ICD-10-CM

## 2018-04-20 DIAGNOSIS — N941 Unspecified dyspareunia: Secondary | ICD-10-CM | POA: Diagnosis not present

## 2018-04-20 DIAGNOSIS — R635 Abnormal weight gain: Secondary | ICD-10-CM

## 2018-04-20 DIAGNOSIS — Z780 Asymptomatic menopausal state: Secondary | ICD-10-CM | POA: Diagnosis not present

## 2018-04-20 DIAGNOSIS — N939 Abnormal uterine and vaginal bleeding, unspecified: Secondary | ICD-10-CM

## 2018-04-20 LAB — POCT URINALYSIS DIPSTICK
Bilirubin, UA: NEGATIVE
Glucose, UA: NEGATIVE
KETONES UA: NEGATIVE
NITRITE UA: NEGATIVE
PH UA: 5 (ref 5.0–8.0)
PROTEIN UA: NEGATIVE
RBC UA: NEGATIVE
Spec Grav, UA: 1.015 (ref 1.010–1.025)

## 2018-04-20 MED ORDER — ESTRADIOL 0.1 MG/GM VA CREA: 1.0000 | TOPICAL_CREAM | Freq: Every day | VAGINAL | 1 refills | Status: DC

## 2018-04-20 NOTE — Progress Notes (Signed)
Grace Fenton, NP   Chief Complaint  Patient presents with  . Hormone Changes    gained 10 lbs last 4-6 months, pt would like hormone lever checked, is having to urine for frequent, and about a week ago saw blood when wiped.    HPI:      Grace Shepard is a 52 y.o. No obstetric history on file. who LMP was No LMP recorded. (Menstrual status: Perimenopausal)., presents today for several issues. She has had urinary frequency with good flow for about 2 months. Occas urgency, but no dysuria, LBP, fevers. Has had achy pelvic discomfort with urin sx. Drinks caffeine. No vag d/c, irritation, odor. Hx of UTI in past.   She has gained about 10# in past 4-6 months. Under increased stress with caregiver to father who passed recently. Pt with hypothyroidism and normal TSH 7/19 with PCP.   LMP ~2017. No vag bleeding/DUB. Does have tolerable vasomotor sx. Has vag dryness with dyspareunia. Did vag ERT with Dr. Laurey Morale in the distant past. Uses KY jelly with a little improvement. Noticed light pink blood with wiping for a day recently. Hadn't been sex active so not sure where it came from. Last pap 7/19 with PCP was negative.   Current on mammo (6/19)   Past Medical History:  Diagnosis Date  . Allergic rhinitis   . Arthritis   . Depression   . Fibromyalgia   . Hyperlipidemia   . Ischemic colitis ([place])   . Migraine   . Thyroid disease   . UTI (lower urinary tract infection)     Past Surgical History:  Procedure Laterality Date  . TUBAL LIGATION      Family History  Problem Relation Age of Onset  . Drug abuse Sister   . Alcohol abuse Sister   . Depression Sister   . Rheum arthritis Father   . Congestive Heart Failure Father   . Depression Father   . Diabetes Father   . Hearing loss Father   . Osteoarthritis Mother   . Stroke Mother   . Aneurysm Mother   . Breast cancer Maternal Aunt   . Heart attack Maternal Grandfather   . Diabetes Brother   . Alcohol abuse Paternal  Uncle   . Arthritis Sister   . Depression Sister   . Heart disease Sister   . Crohn's disease Sister     Social History   Socioeconomic History  . Marital status: Married    Spouse name: Not on file  . Number of children: Not on file  . Years of education: Not on file  . Highest education level: Not on file  Occupational History  . Not on file  Social Needs  . Financial resource strain: Not on file  . Food insecurity:    Worry: Not on file    Inability: Not on file  . Transportation needs:    Medical: Not on file    Non-medical: Not on file  Tobacco Use  . Smoking status: Never Smoker  . Smokeless tobacco: Never Used  Substance and Sexual Activity  . Alcohol use: No  . Drug use: No  . Sexual activity: Yes    Comment: Perimenopausal  Lifestyle  . Physical activity:    Days per week: Not on file    Minutes per session: Not on file  . Stress: Not on file  Relationships  . Social connections:    Talks on phone: Not on file    Gets  together: Not on file    Attends religious service: Not on file    Active member of club or organization: Not on file    Attends meetings of clubs or organizations: Not on file    Relationship status: Not on file  . Intimate partner violence:    Fear of current or ex partner: Not on file    Emotionally abused: Not on file    Physically abused: Not on file    Forced sexual activity: Not on file  Other Topics Concern  . Not on file  Social History Narrative  . Not on file    Outpatient Medications Prior to Visit  Medication Sig Dispense Refill  . atorvastatin (LIPITOR) 10 MG tablet Take 1 tablet (10 mg total) by mouth daily. 90 tablet 0  . cetirizine (ZYRTEC) 10 MG tablet Take 10 mg by mouth daily.    . Cholecalciferol (D 1000) 1000 units capsule Take 4,000 Units by mouth.     . citalopram (CELEXA) 40 MG tablet Take 1 tablet (40 mg total) by mouth daily. 90 tablet 2  . levothyroxine (SYNTHROID, LEVOTHROID) 50 MCG tablet Take 1 tablet  (50 mcg total) by mouth daily. 90 tablet 2  . montelukast (SINGULAIR) 10 MG tablet Take 1 tablet (10 mg total) by mouth at bedtime. 90 tablet 2  . nortriptyline (PAMELOR) 10 MG capsule     . pantoprazole (PROTONIX) 40 MG tablet Take 1 tablet (40 mg total) by mouth daily. 90 tablet 2  . pregabalin (LYRICA) 75 MG capsule Take 1 capsule (75 mg total) by mouth 2 (two) times daily. 180 capsule 2  . vitamin B-12 (CYANOCOBALAMIN) 1000 MCG tablet Take 1,000 mcg by mouth daily.     Marland Kitchen zolpidem (AMBIEN CR) 6.25 MG CR tablet Take 1 tablet (6.25 mg total) by mouth at bedtime as needed. for sleep 90 tablet 0  . albuterol (PROVENTIL HFA;VENTOLIN HFA) 108 (90 Base) MCG/ACT inhaler Inhale into the lungs.    Marland Kitchen azelastine (ASTELIN) 0.1 % nasal spray Place 2 sprays into both nostrils 2 (two) times daily. Use in each nostril as directed    . multivitamin (ONE-A-DAY MEN'S) TABS tablet Take 1 tablet by mouth daily.     No facility-administered medications prior to visit.     ROS:  Review of Systems  Constitutional: Positive for fatigue. Negative for fever and unexpected weight change.  Respiratory: Negative for cough, shortness of breath and wheezing.   Cardiovascular: Negative for chest pain, palpitations and leg swelling.  Gastrointestinal: Negative for blood in stool, constipation, diarrhea, nausea and vomiting.  Endocrine: Negative for cold intolerance, heat intolerance and polyuria.  Genitourinary: Positive for dyspareunia, frequency, pelvic pain, urgency and vaginal bleeding. Negative for dysuria, flank pain, genital sores, hematuria, menstrual problem, vaginal discharge and vaginal pain.  Musculoskeletal: Positive for back pain. Negative for joint swelling and myalgias.  Skin: Negative for rash.  Neurological: Negative for dizziness, syncope, light-headedness, numbness and headaches.  Hematological: Negative for adenopathy.  Psychiatric/Behavioral: Positive for agitation and dysphoric mood. Negative for  confusion, sleep disturbance and suicidal ideas. The patient is not nervous/anxious.      OBJECTIVE:   Vitals:  BP 120/70   Pulse 98   Ht 4\' 10"  (1.473 m)   Wt 149 lb (67.6 kg)   BMI 31.14 kg/m   Physical Exam  Constitutional: She is oriented to person, place, and time. Vital signs are normal. She appears well-developed.  Pulmonary/Chest: Effort normal.  Abdominal: There is no tenderness. There  is no rigidity and no guarding.  Genitourinary: Vagina normal and uterus normal. There is no rash, tenderness or lesion on the right labia. There is no rash, tenderness or lesion on the left labia. Uterus is not enlarged and not tender. Cervix exhibits no motion tenderness. Right adnexum displays no mass and no tenderness. Left adnexum displays no mass and no tenderness. No erythema or tenderness in the vagina. No vaginal discharge found.  Genitourinary Comments: MILD VAG ATROPHY  Musculoskeletal: Normal range of motion.  Neurological: She is alert and oriented to person, place, and time.  Psychiatric: She has a normal mood and affect. Her behavior is normal. Thought content normal.  Vitals reviewed.   Results: Results for orders placed or performed in visit on 04/20/18 (from the past 24 hour(s))  POCT Urinalysis Dipstick     Status: Abnormal   Collection Time: 04/20/18  3:23 PM  Result Value Ref Range   Color, UA yellow    Clarity, UA clear    Glucose, UA Negative Negative   Bilirubin, UA neg    Ketones, UA neg    Spec Grav, UA 1.015 1.010 - 1.025   Blood, UA neg    pH, UA 5.0 5.0 - 8.0   Protein, UA Negative Negative   Urobilinogen, UA     Nitrite, UA neg    Leukocytes, UA Small (1+) (A) Negative   Appearance     Odor       Assessment/Plan: Dyspareunia in female - Mild atrophy on exam. Try vag ERT. Rx estrace crm. F/u prn. Add coconut oil as lubricant/moisturizer.  - Plan: estradiol (ESTRACE) 0.1 MG/GM vaginal cream  Urinary frequency - Essent neg dip. Check C&S. Will call  with results. If neg, d/c caffeine to see if sx improve.  - Plan: POCT Urinalysis Dipstick, Urine Culture  Post-menopause - No need for hormone testing. Declines HRT. Discussed wt gain in abd. Diet/exercise changes.   Weight gain - Normal TSH. Could be related to recent stress. Diet/exercise changes. F/u with PCP prn.  Vaginal bleeding - Question postmenopausal vs atrophy vs hematuria. F/u if sx recur for vag exam/GYN u/s prn. No evid of bleeding today.    Meds ordered this encounter  Medications  . estradiol (ESTRACE) 0.1 MG/GM vaginal cream    Sig: Place 1 Applicatorful vaginally at bedtime. Insert 1g nightly for 1 wk, then 1 g once weekly as maintenace    Dispense:  42.5 g    Refill:  1    Order Specific Question:   Supervising Provider    Answer:   Gae Dry [[id number]]      Return if symptoms worsen or fail to improve.  Alicia B. Copland, PA-C 04/20/2018 3:33 PM

## 2018-04-20 NOTE — Patient Instructions (Signed)
I value your feedback and entrusting us with your care. If you get a [facility] patient survey, I would appreciate you taking the time to let us know about your experience today. Thank you! 

## 2018-04-22 ENCOUNTER — Other Ambulatory Visit: Payer: Self-pay | Admitting: Obstetrics and Gynecology

## 2018-04-22 DIAGNOSIS — N3000 Acute cystitis without hematuria: Secondary | ICD-10-CM

## 2018-04-22 LAB — URINE CULTURE

## 2018-04-22 MED ORDER — FLUCONAZOLE 150 MG PO TABS: 150.0000 mg | ORAL_TABLET | Freq: Once | ORAL | 0 refills | Status: AC

## 2018-04-22 MED ORDER — AMOXICILLIN 500 MG PO CAPS: 500.0000 mg | ORAL_CAPSULE | Freq: Three times a day (TID) | ORAL | 0 refills | Status: AC

## 2018-04-22 NOTE — Progress Notes (Signed)
Rx abx for GBS UTI. Pt aware.

## 2018-05-25 ENCOUNTER — Other Ambulatory Visit: Payer: Self-pay | Admitting: Internal Medicine

## 2018-05-25 NOTE — Telephone Encounter (Signed)
Will route to office for final disposition. 

## 2018-05-25 NOTE — Telephone Encounter (Signed)
Copied from [place] [id number]. Topic: Quick Communication - Rx Refill/Question >> May 25, 2018  1:42 PM Sheran Luz wrote: Medication: zolpidem (AMBIEN CR) 6.25 MG CR tablet  Pt is requesting refill of this medication sent to pharmacy.   Preferred Pharmacy (with phone number or street name): [facility] [address] [place]  [phone] (Phone) [phone] (Fax)

## 2018-05-25 NOTE — Telephone Encounter (Signed)
Last Rx 02/23/2018. Last OV 02/18/2018

## 2018-05-26 MED ORDER — ZOLPIDEM TARTRATE ER 6.25 MG PO TBCR: 6.2500 mg | EXTENDED_RELEASE_TABLET | Freq: Every evening | ORAL | 0 refills | Status: DC | PRN

## 2018-07-13 ENCOUNTER — Other Ambulatory Visit: Payer: Self-pay | Admitting: Internal Medicine

## 2018-08-21 ENCOUNTER — Other Ambulatory Visit: Payer: Self-pay | Admitting: Internal Medicine

## 2018-08-24 NOTE — Telephone Encounter (Signed)
Last filled 05/26/2018 #90... please advise

## 2018-09-11 ENCOUNTER — Ambulatory Visit (INDEPENDENT_AMBULATORY_CARE_PROVIDER_SITE_OTHER): Admitting: Internal Medicine

## 2018-09-11 ENCOUNTER — Encounter: Payer: Self-pay | Admitting: Internal Medicine

## 2018-09-11 VITALS — BP 118/70 | HR 86 | Temp 97.9°F | Wt 153.0 lb

## 2018-09-11 DIAGNOSIS — G8929 Other chronic pain: Secondary | ICD-10-CM | POA: Diagnosis not present

## 2018-09-11 DIAGNOSIS — E782 Mixed hyperlipidemia: Secondary | ICD-10-CM | POA: Diagnosis not present

## 2018-09-11 DIAGNOSIS — M545 Low back pain: Secondary | ICD-10-CM | POA: Diagnosis not present

## 2018-09-11 LAB — POC URINALSYSI DIPSTICK (AUTOMATED)
Bilirubin, UA: NEGATIVE
Glucose, UA: NEGATIVE
Ketones, UA: NEGATIVE
Leukocytes, UA: NEGATIVE
NITRITE UA: NEGATIVE
Protein, UA: NEGATIVE
RBC UA: NEGATIVE
Spec Grav, UA: 1.02 (ref 1.010–1.025)
Urobilinogen, UA: 0.2 E.U./dL
pH, UA: 6 (ref 5.0–8.0)

## 2018-09-11 MED ORDER — METHOCARBAMOL 500 MG PO TABS: 500.0000 mg | ORAL_TABLET | Freq: Every evening | ORAL | 0 refills | Status: DC | PRN

## 2018-09-11 MED ORDER — PREDNISONE 10 MG PO TABS: ORAL_TABLET | ORAL | 0 refills | Status: DC

## 2018-09-11 NOTE — Progress Notes (Signed)
Subjective:    Patient ID: Grace Shepard, female    DOB: [birth year], 53 y.o.   MRN: [id number]  HPI  Pt presents to the clinic today with c/o low back pain. She reports this started years ago after having her kids, but has worsened in the last 2 months. She describes the pain as constant, throbbing pain.  The pain does not radiate. She denies muscle tightness or spasms. She denies any injury to the area. She denies numbness, tingling or weakness of the BLE. She denies GI or GU related symptoms. She has tried heat and Tylenol with minimal relief.   Xray of lumbar spine from 10/26 showed:  IMPRESSION: There is no acute or significant chronic bony abnormality of the lumbar spine. 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   Review of Systems      Past Medical History:  Diagnosis Date  . Allergic rhinitis   . Arthritis   . Depression   . Fibromyalgia   . Hyperlipidemia   . Ischemic colitis ([place])   . Migraine   . Thyroid disease   . UTI (lower urinary tract infection)     Current Outpatient Medications  Medication Sig Dispense Refill  . albuterol (PROVENTIL HFA;VENTOLIN HFA) 108 (90 Base) MCG/ACT inhaler Inhale into the lungs.    Marland Kitchen atorvastatin (LIPITOR) 10 MG tablet Take 1 tablet (10 mg total) by mouth daily. MUST SCHEDULE LAB APPOINTMENT 90 tablet 0  . cetirizine (ZYRTEC) 10 MG tablet Take 10 mg by mouth daily.    . Cholecalciferol (D 1000) 1000 units capsule Take 4,000 Units by mouth.     . citalopram (CELEXA) 40 MG tablet Take 1 tablet (40 mg total) by mouth daily. 90 tablet 2  . estradiol (ESTRACE) 0.1 MG/GM vaginal cream Place 1 Applicatorful vaginally at bedtime. Insert 1g nightly for 1 wk, then 1 g once weekly as maintenace 42.5 g 1  . levothyroxine (SYNTHROID, LEVOTHROID) 50 MCG tablet Take 1 tablet (50 mcg total) by mouth daily. 90 tablet 2  . montelukast (SINGULAIR) 10 MG tablet Take 1 tablet (10 mg total) by mouth at bedtime. 90 tablet 2  . nortriptyline  (PAMELOR) 10 MG capsule     . pantoprazole (PROTONIX) 40 MG tablet Take 1 tablet (40 mg total) by mouth daily. 90 tablet 2  . pregabalin (LYRICA) 75 MG capsule Take 1 capsule (75 mg total) by mouth 2 (two) times daily. 180 capsule 2  . vitamin B-12 (CYANOCOBALAMIN) 1000 MCG tablet Take 1,000 mcg by mouth daily.     Marland Kitchen zolpidem (AMBIEN CR) 6.25 MG CR tablet TAKE 1 TABLET BY MOUTH AT BEDTIME AS NEEDED FOR SLEEP 90 tablet 0   No current facility-administered medications for this visit.     Allergies  Allergen Reactions  . Bactrim [Sulfamethoxazole-Trimethoprim] Hives  . Gabapentin Other (See Comments)  . Tramadol Itching    Family History  Problem Relation Age of Onset  . Drug abuse Sister   . Alcohol abuse Sister   . Depression Sister   . Rheum arthritis Father   . Congestive Heart Failure Father   . Depression Father   . Diabetes Father   . Hearing loss Father   . Osteoarthritis Mother   . Stroke Mother   . Aneurysm Mother   . Breast cancer Maternal Aunt   . Heart attack Maternal Grandfather   . Diabetes Brother   . Alcohol abuse Paternal Uncle   . Arthritis Sister   . Depression  Sister   . Heart disease Sister   . Crohn's disease Sister     Social History   Socioeconomic History  . Marital status: Married    Spouse name: Not on file  . Number of children: Not on file  . Years of education: Not on file  . Highest education level: Not on file  Occupational History  . Not on file  Social Needs  . Financial resource strain: Not on file  . Food insecurity:    Worry: Not on file    Inability: Not on file  . Transportation needs:    Medical: Not on file    Non-medical: Not on file  Tobacco Use  . Smoking status: Never Smoker  . Smokeless tobacco: Never Used  Substance and Sexual Activity  . Alcohol use: No  . Drug use: No  . Sexual activity: Yes    Comment: Perimenopausal  Lifestyle  . Physical activity:    Days per week: Not on file    Minutes per session:  Not on file  . Stress: Not on file  Relationships  . Social connections:    Talks on phone: Not on file    Gets together: Not on file    Attends religious service: Not on file    Active member of club or organization: Not on file    Attends meetings of clubs or organizations: Not on file    Relationship status: Not on file  . Intimate partner violence:    Fear of current or ex partner: Not on file    Emotionally abused: Not on file    Physically abused: Not on file    Forced sexual activity: Not on file  Other Topics Concern  . Not on file  Social History Narrative  . Not on file     Constitutional: Denies fever, malaise, fatigue, headache or abrupt weight changes.  Respiratory: Denies difficulty breathing, shortness of breath, cough or sputum production.   Cardiovascular: Denies chest pain, chest tightness, palpitations or swelling in the hands or feet.  Gastrointestinal: Denies abdominal pain, bloating, constipation, diarrhea or blood in the stool.  GU: Denies urgency, frequency, pain with urination, burning sensation, blood in urine, odor or discharge. Musculoskeletal: Pt reports low back pain. Denies decrease in range of motion, difficulty with gait, or joint swelling.  Neurological: Denies numbness, tingling, weakness, dizziness, difficulty with memory, difficulty with speech or problems with balance and coordination.    No other specific complaints in a complete review of systems (except as listed in HPI above).  Objective:   Physical Exam   BP 118/70   Pulse 86   Temp 97.9 F (36.6 C) (Oral)   Wt 153 lb (69.4 kg)   SpO2 99%   BMI 31.98 kg/m  Wt Readings from Last 3 Encounters:  09/11/18 153 lb (69.4 kg)  04/20/18 149 lb (67.6 kg)  02/18/18 161 lb (73 kg)    General: Appears her stated age, well developed, well nourished in NAD. Skin: Warm, dry and intact. No rashes noted. Cardiovascular: Normal rate and rhythm. S1,S2 noted.  No murmur, rubs or gallops  noted. Pulmonary/Chest: Normal effort and positive vesicular breath sounds. No respiratory distress. No wheezes, rales or ronchi noted.  Abdomen: Soft and nontender. Normal bowel sounds. No distention or masses noted. No CVA tenderness noted. Musculoskeletal: Normal flexion and rotation to the right of the spine. Pain with extension and rotation to the left. Bony tenderness noted over the lumbar spine. No pain with  palpation of the SI joints. Pain with palpation bilateral paralumbar muscles. Strength 5/5 BLE. No difficulty with gait. Neurological: Alert and oriented. Negative SLR bilaterally.   BMET    Component Value Date/Time   NA 141 02/18/2018 1501   NA 139 05/29/2014 0516   K 3.7 02/18/2018 1501   K 3.6 05/29/2014 0516   CL 103 02/18/2018 1501   CL 104 05/29/2014 0516   CO2 28 02/18/2018 1501   CO2 24 05/29/2014 0516   GLUCOSE 77 02/18/2018 1501   GLUCOSE 130 (H) 05/29/2014 0516   BUN 10 02/18/2018 1501   BUN 7 05/29/2014 0516   CREATININE 0.88 02/18/2018 1501   CREATININE 0.96 05/29/2014 0516   CALCIUM 9.7 02/18/2018 1501   CALCIUM 8.6 05/29/2014 0516   GFRNONAA >60 06/16/2016 1828   GFRNONAA >60 05/29/2014 0516   GFRNONAA >60 06/28/2013 0511   GFRAA >60 06/16/2016 1828   GFRAA >60 05/29/2014 0516   GFRAA >60 06/28/2013 0511    Lipid Panel     Component Value Date/Time   CHOL 246 (H) 02/18/2018 1501   TRIG 228.0 (H) 02/18/2018 1501   HDL 58.70 02/18/2018 1501   CHOLHDL 4 02/18/2018 1501   VLDL 45.6 (H) 02/18/2018 1501    CBC    Component Value Date/Time   WBC 8.0 02/18/2018 1501   RBC 4.72 02/18/2018 1501   HGB 13.8 02/18/2018 1501   HGB 13.9 05/29/2014 0516   HCT 40.9 02/18/2018 1501   HCT 41.3 05/29/2014 0516   PLT 302.0 02/18/2018 1501   PLT 227 05/29/2014 0516   MCV 86.7 02/18/2018 1501   MCV 89 05/29/2014 0516   MCH 30.4 06/16/2016 1828   MCHC 33.7 02/18/2018 1501   RDW 13.4 02/18/2018 1501   RDW 13.2 05/29/2014 0516   LYMPHSABS 1.9 12/30/2014  1425   LYMPHSABS 1.8 05/29/2014 0516   MONOABS 0.7 12/30/2014 1425   MONOABS 1.1 (H) 05/29/2014 0516   EOSABS 0.2 12/30/2014 1425   EOSABS 0.2 05/29/2014 0516   BASOSABS 0.1 12/30/2014 1425   BASOSABS 0.1 05/29/2014 0516    Hgb A1C No results found for: HGBA1C         Assessment & Plan:   Chronic Low Back Pain with Bilateral Sciatica:  Urinalysis: normal No need to repeat imaging at this time RX for Pred Taper x 9 days- avoid OTC NSAID's RX for Methocarbamol 500 mg QHS- sedation caution given Back exercises given If worse, consider referral for PT  Return precautions discussed [name], NP

## 2018-09-11 NOTE — Patient Instructions (Signed)

## 2018-09-12 LAB — LIPID PANEL
Cholesterol: 194 mg/dL (ref <200)
HDL: 60 mg/dL (ref >50)
LDL Cholesterol (Calc): 109 mg/dL (calc) — ABNORMAL HIGH
NON-HDL CHOLESTEROL (CALC): 134 mg/dL — AB (ref <130)
Total CHOL/HDL Ratio: 3.2 (calc) (ref <5.0)
Triglycerides: 130 mg/dL (ref <150)

## 2018-09-12 LAB — COMPREHENSIVE METABOLIC PANEL
AG Ratio: 2.2 (calc) (ref 1.0–2.5)
ALT: 27 U/L (ref 6–29)
AST: 25 U/L (ref 10–35)
Albumin: 5.1 g/dL (ref 3.6–5.1)
Alkaline phosphatase (APISO): 135 U/L — ABNORMAL HIGH (ref 33–130)
BUN: 9 mg/dL (ref 7–25)
CO2: 26 mmol/L (ref 20–32)
Calcium: 10 mg/dL (ref 8.6–10.4)
Chloride: 102 mmol/L (ref 98–110)
Creat: 0.87 mg/dL (ref 0.50–1.05)
GLUCOSE: 75 mg/dL (ref 65–99)
Globulin: 2.3 g/dL (calc) (ref 1.9–3.7)
POTASSIUM: 4 mmol/L (ref 3.5–5.3)
Sodium: 140 mmol/L (ref 135–146)
Total Bilirubin: 0.3 mg/dL (ref 0.2–1.2)
Total Protein: 7.4 g/dL (ref 6.1–8.1)

## 2018-09-14 LAB — HM COLONOSCOPY

## 2018-10-08 ENCOUNTER — Other Ambulatory Visit: Payer: Self-pay | Admitting: Otolaryngology

## 2018-10-08 DIAGNOSIS — R438 Other disturbances of smell and taste: Secondary | ICD-10-CM

## 2018-10-13 ENCOUNTER — Other Ambulatory Visit: Payer: Self-pay | Admitting: Internal Medicine

## 2018-10-15 ENCOUNTER — Ambulatory Visit
Admission: RE | Admit: 2018-10-15 | Discharge: 2018-10-15 | Disposition: A | Discharge status: Home / Self Care | Source: Ambulatory Visit | Attending: Otolaryngology | Admitting: Otolaryngology

## 2018-10-15 DIAGNOSIS — R438 Other disturbances of smell and taste: Secondary | ICD-10-CM

## 2018-10-15 MED ORDER — GADOBUTROL 1 MMOL/ML IV SOLN
6.0000 mL | Freq: Once | INTRAVENOUS | Status: AC | PRN
  Administered 2018-10-15: 6 mL via INTRAVENOUS

## 2018-11-15 ENCOUNTER — Other Ambulatory Visit: Payer: Self-pay | Admitting: Internal Medicine

## 2018-11-17 ENCOUNTER — Other Ambulatory Visit: Payer: Self-pay | Admitting: Internal Medicine

## 2018-11-17 NOTE — Telephone Encounter (Signed)
Last filled 08/24/2018... please advise

## 2018-11-20 ENCOUNTER — Other Ambulatory Visit: Payer: Self-pay | Admitting: Internal Medicine

## 2018-11-30 ENCOUNTER — Other Ambulatory Visit: Payer: Self-pay

## 2018-11-30 ENCOUNTER — Telehealth: Payer: Self-pay | Admitting: Internal Medicine

## 2018-11-30 ENCOUNTER — Ambulatory Visit (INDEPENDENT_AMBULATORY_CARE_PROVIDER_SITE_OTHER): Admitting: Internal Medicine

## 2018-11-30 ENCOUNTER — Encounter: Payer: Self-pay | Admitting: Internal Medicine

## 2018-11-30 DIAGNOSIS — E7800 Pure hypercholesterolemia, unspecified: Secondary | ICD-10-CM | POA: Diagnosis not present

## 2018-11-30 DIAGNOSIS — M797 Fibromyalgia: Secondary | ICD-10-CM | POA: Diagnosis not present

## 2018-11-30 DIAGNOSIS — M791 Myalgia, unspecified site: Secondary | ICD-10-CM

## 2018-11-30 MED ORDER — PREGABALIN 75 MG PO CAPS: 75.0000 mg | ORAL_CAPSULE | Freq: Two times a day (BID) | ORAL | 1 refills | Status: DC

## 2018-11-30 NOTE — Telephone Encounter (Signed)
Caller Name: Grace Shepard Relationship to Patient:  Best Number:  Pharmacy: [facility]  Reason for call: Lyrica 75mg  refill, was told by pharmacy she needs a new script sent over.  Also, pt would like a call back concerning atorvastatin side effects. She has been having increased cramping/ muscle pain.

## 2018-11-30 NOTE — Patient Instructions (Signed)

## 2018-11-30 NOTE — Progress Notes (Signed)
Virtual Visit via Video Note  I connected with Grace Shepard on 11/30/18 at  3:45 PM EDT by a video enabled telemedicine application and verified that I am speaking with the correct person using two identifiers.   I discussed the limitations of evaluation and management by telemedicine and the availability of in person appointments. The patient expressed understanding and agreed to proceed.  Patient Location: Home Provider Location: Office  History of Present Illness:  Pt reports muscle and joint aches. She reports this started years ago, but it seems to be getting worse. She has any trouble being active for more than 5 minutes at a time. She denies joint swelling, redness or warmth. She has a history of fibromyalgia and is taking Lyrica, Nortriptyline and Flexeril, but she is not sure how much it is helping. She is not taking any NSAID's OTC.  She is concerned it may be related to her cholesterol medication.  She would also like a refill of her Lyrica.  Observations/Objective:  Alert and oriented x 3 NAD Obese No obvious joint swelling General movement appears normal  Assessment and Plan:  Fibromyalgia, Myalgias, HLD:  Encouraged regular stretching and exercise for toning and weight loss Hold Atorvastatin x 2 weeks If improves, will have her restart at lower dose every other day If no improvement, will have her make lab only appt for CMET, Lipid, CK, ANA, ESR, RF, CRP  Follow Up Instructions:    I discussed the assessment and treatment plan with the patient. The patient was provided an opportunity to ask questions and all were answered. The patient agreed with the plan and demonstrated an understanding of the instructions.   The patient was advised to call back or seek an in-person evaluation if the symptoms worsen or if the condition fails to improve as anticipated.     [name], NP

## 2018-12-14 ENCOUNTER — Telehealth: Payer: Self-pay

## 2018-12-14 NOTE — Telephone Encounter (Signed)
Pt left v/m; pt seen 11/30/18; pt has been off atorvastatin for 2 wks and the pain has subsided greatly. Pt request cb with what to do. In office note on 11/30/18 there was note if pain improved after holding atorvastatin 2 wks then restart at lower dose every other day.Please advise.

## 2018-12-15 NOTE — Telephone Encounter (Signed)
Yes, I want her to start 1/2 tab (5mg ) of Atorvastatin every other day. If pain returns, she should let me know.

## 2018-12-18 NOTE — Telephone Encounter (Signed)
Pt is aware as instructed and expressed understanding 

## 2018-12-30 ENCOUNTER — Other Ambulatory Visit: Payer: Self-pay | Admitting: Internal Medicine

## 2019-01-11 ENCOUNTER — Other Ambulatory Visit: Payer: Self-pay | Admitting: Internal Medicine

## 2019-02-11 ENCOUNTER — Encounter: Payer: Self-pay | Admitting: Internal Medicine

## 2019-02-13 ENCOUNTER — Other Ambulatory Visit: Payer: Self-pay | Admitting: Internal Medicine

## 2019-02-17 ENCOUNTER — Other Ambulatory Visit: Payer: Self-pay | Admitting: Internal Medicine

## 2019-02-17 NOTE — Telephone Encounter (Signed)
Last filled 11/17/2018 please advise

## 2019-02-18 ENCOUNTER — Other Ambulatory Visit: Payer: Self-pay | Admitting: Internal Medicine

## 2019-02-22 ENCOUNTER — Telehealth: Payer: Self-pay

## 2019-02-22 NOTE — Telephone Encounter (Signed)
Pt left v/m wanting to know if it was time for pt to be tested again for thyroid. Pt is more sluggish than usual but pt has been more active than usual. Pt also wants to know if Lorrin Mais was filled. Lorrin Mais was sent electronically on 02/17/19 for # 90 when talk with pt. Pt last TSH was 0.58 on 02/18/2018.No future appts are scheduled.Please advise.

## 2019-02-22 NOTE — Telephone Encounter (Signed)
Pt scheduled for CPE on 03/04/19 at 1045am Nothing further needed.

## 2019-02-22 NOTE — Telephone Encounter (Signed)
She is due for CPE. Have her schedule and we can recheck thyroid at that time.

## 2019-03-04 ENCOUNTER — Ambulatory Visit (INDEPENDENT_AMBULATORY_CARE_PROVIDER_SITE_OTHER): Admitting: Internal Medicine

## 2019-03-04 ENCOUNTER — Other Ambulatory Visit: Payer: Self-pay

## 2019-03-04 ENCOUNTER — Encounter: Payer: Self-pay | Admitting: Internal Medicine

## 2019-03-04 VITALS — BP 122/78 | HR 84 | Temp 98.3°F | Ht 58.25 in | Wt 153.0 lb

## 2019-03-04 DIAGNOSIS — K219 Gastro-esophageal reflux disease without esophagitis: Secondary | ICD-10-CM | POA: Diagnosis not present

## 2019-03-04 DIAGNOSIS — F419 Anxiety disorder, unspecified: Secondary | ICD-10-CM

## 2019-03-04 DIAGNOSIS — E559 Vitamin D deficiency, unspecified: Secondary | ICD-10-CM

## 2019-03-04 DIAGNOSIS — Z780 Asymptomatic menopausal state: Secondary | ICD-10-CM

## 2019-03-04 DIAGNOSIS — R3989 Other symptoms and signs involving the genitourinary system: Secondary | ICD-10-CM | POA: Diagnosis not present

## 2019-03-04 DIAGNOSIS — R350 Frequency of micturition: Secondary | ICD-10-CM

## 2019-03-04 DIAGNOSIS — E7800 Pure hypercholesterolemia, unspecified: Secondary | ICD-10-CM

## 2019-03-04 DIAGNOSIS — E039 Hypothyroidism, unspecified: Secondary | ICD-10-CM | POA: Diagnosis not present

## 2019-03-04 DIAGNOSIS — F5104 Psychophysiologic insomnia: Secondary | ICD-10-CM

## 2019-03-04 DIAGNOSIS — E785 Hyperlipidemia, unspecified: Secondary | ICD-10-CM | POA: Insufficient documentation

## 2019-03-04 DIAGNOSIS — Z0000 Encounter for general adult medical examination without abnormal findings: Secondary | ICD-10-CM | POA: Diagnosis not present

## 2019-03-04 DIAGNOSIS — K449 Diaphragmatic hernia without obstruction or gangrene: Secondary | ICD-10-CM | POA: Insufficient documentation

## 2019-03-04 DIAGNOSIS — M797 Fibromyalgia: Secondary | ICD-10-CM

## 2019-03-04 DIAGNOSIS — F329 Major depressive disorder, single episode, unspecified: Secondary | ICD-10-CM

## 2019-03-04 DIAGNOSIS — G43019 Migraine without aura, intractable, without status migrainosus: Secondary | ICD-10-CM

## 2019-03-04 LAB — COMPREHENSIVE METABOLIC PANEL
ALT: 32 U/L (ref 0–35)
AST: 27 U/L (ref 0–37)
Albumin: 4.7 g/dL (ref 3.5–5.2)
Alkaline Phosphatase: 138 U/L — ABNORMAL HIGH (ref 39–117)
BUN: 10 mg/dL (ref 6–23)
CO2: 28 mEq/L (ref 19–32)
Calcium: 9.9 mg/dL (ref 8.4–10.5)
Chloride: 103 mEq/L (ref 96–112)
Creatinine, Ser: 0.9 mg/dL (ref 0.40–1.20)
GFR: 66.02 mL/min (ref >60.00)
Glucose, Bld: 83 mg/dL (ref 70–99)
Potassium: 4.1 mEq/L (ref 3.5–5.1)
Sodium: 139 mEq/L (ref 135–145)
Total Bilirubin: 0.5 mg/dL (ref 0.2–1.2)
Total Protein: 7 g/dL (ref 6.0–8.3)

## 2019-03-04 LAB — T4, FREE: Free T4: 0.8 ng/dL (ref 0.60–1.60)

## 2019-03-04 LAB — VITAMIN D 25 HYDROXY (VIT D DEFICIENCY, FRACTURES): VITD: 57.65 ng/mL (ref 30.00–100.00)

## 2019-03-04 LAB — POC URINALSYSI DIPSTICK (AUTOMATED)
Bilirubin, UA: NEGATIVE
Blood, UA: NEGATIVE
Glucose, UA: NEGATIVE
Ketones, UA: NEGATIVE
Leukocytes, UA: NEGATIVE
Nitrite, UA: NEGATIVE
Protein, UA: NEGATIVE
Spec Grav, UA: 1.015 (ref 1.010–1.025)
Urobilinogen, UA: 0.2 E.U./dL
pH, UA: 6 (ref 5.0–8.0)

## 2019-03-04 LAB — TSH: TSH: 15.63 u[IU]/mL — ABNORMAL HIGH (ref 0.35–4.50)

## 2019-03-04 LAB — LIPID PANEL
Cholesterol: 258 mg/dL — ABNORMAL HIGH (ref 0–200)
HDL: 56.5 mg/dL (ref >39.00)
LDL Cholesterol: 175 mg/dL — ABNORMAL HIGH (ref 0–99)
NonHDL: 201.75
Total CHOL/HDL Ratio: 5
Triglycerides: 132 mg/dL (ref 0.0–149.0)
VLDL: 26.4 mg/dL (ref 0.0–40.0)

## 2019-03-04 LAB — CBC
HCT: 40.6 % (ref 36.0–46.0)
Hemoglobin: 13.7 g/dL (ref 12.0–15.0)
MCHC: 33.7 g/dL (ref 30.0–36.0)
MCV: 87.7 fl (ref 78.0–100.0)
Platelets: 280 10*3/uL (ref 150.0–400.0)
RBC: 4.63 Mil/uL (ref 3.87–5.11)
RDW: 13.2 % (ref 11.5–15.5)
WBC: 6.8 10*3/uL (ref 4.0–10.5)

## 2019-03-04 LAB — HEMOGLOBIN A1C: Hgb A1c MFr Bld: 5.7 % (ref 4.6–6.5)

## 2019-03-04 LAB — VITAMIN B12: Vitamin B-12: 1038 pg/mL — ABNORMAL HIGH (ref 211–911)

## 2019-03-04 NOTE — Assessment & Plan Note (Addendum)
Currently managed well with Nortriptyline. Continue to use Tylenol as needed for breakthrough. Follows with Dr. Melrose Nakayama.

## 2019-03-04 NOTE — Progress Notes (Signed)
Subjective:    Patient ID: Grace Shepard, female    DOB: 1966-05-13, 53 y.o.   MRN: [id number]  HPI  Patient presents to the clinic today for her annual physical examination.  She is due for follow up of chronic conditions.  Anxiety and Depression:  Currently managed on Celexa.  She denies SI/HI.  She does not see a therapist.  Fibromyalgia:  Currently managed on Nortriptyline, Lyrica as prescribed. She does require 11-12 hours of sleep. She is not getting any routine exercise.  GERD:  Triggered by spicy foods. She does have a hiatal hernia. She is taking Pantoprazole as prescribed.  She had an upper GI by [facility], 2019.  Hypothyroidism: Currently well managed on Levothyroxine.  Insomnia:  She takes Ambien for this with good relief. There is no sleep study on file but reports she has had one around 2016/2017..   Migraines:  Currently managed on Nortriptyline. She takes Tylenol as needed for breakthrough. She follows with Dr. Melrose Nakayama.  HLD:  Her last LDL was 109 on 08/2018.  She is not taking Simvastatin due to myalgias  She denies myalgias. She does not consume a low fat diet.  Patient also complains of intermittent sensation of pressure in the bladder.  She says she has had UTIs in the past without having other symptoms and would like to have her urine checked.  Flu: 06/2017 Tetanus: 06/2016 Pap Smear: 02/2018 Mammogram: 02/2019 [facility] Colon Screening: 2019 [facility] Screening: as needed Dentist: biannually  Diet: She does eat meat. She consumes fruits and veggies. She does eat some fried foods. She drinks mostly water and soda. Exercise: None   Review of Systems  Past Medical History:  Diagnosis Date  . Allergic rhinitis   . Arthritis   . Depression   . Fibromyalgia   . Hyperlipidemia   . Ischemic colitis ([place])   . Migraine   . Thyroid disease   . UTI (lower urinary tract infection)     Current Outpatient Medications  Medication Sig Dispense Refill  . albuterol  (PROVENTIL HFA;VENTOLIN HFA) 108 (90 Base) MCG/ACT inhaler Inhale into the lungs.    Marland Kitchen atorvastatin (LIPITOR) 10 MG tablet Take 1 tablet (10 mg total) by mouth daily at 6 PM. MUST SCHEDULE PHYSICAL EXAM 90 tablet 0  . cetirizine (ZYRTEC) 10 MG tablet Take 10 mg by mouth daily.    . Cholecalciferol (D 1000) 1000 units capsule Take 4,000 Units by mouth.     . citalopram (CELEXA) 40 MG tablet TAKE 1 TABLET DAILY 90 tablet 0  . estradiol (ESTRACE) 0.1 MG/GM vaginal cream Place 1 Applicatorful vaginally at bedtime. Insert 1g nightly for 1 wk, then 1 g once weekly as maintenace 42.5 g 1  . levothyroxine (SYNTHROID) 50 MCG tablet TAKE 1 TABLET DAILY 90 tablet 0  . methocarbamol (ROBAXIN) 500 MG tablet Take 1 tablet (500 mg total) by mouth at bedtime as needed for muscle spasms. 20 tablet 0  . montelukast (SINGULAIR) 10 MG tablet TAKE 1 TABLET AT BEDTIME 90 tablet 3  . nortriptyline (PAMELOR) 10 MG capsule     . pantoprazole (PROTONIX) 40 MG tablet Take 1 tablet (40 mg total) by mouth daily. 90 tablet 2  . pregabalin (LYRICA) 75 MG capsule Take 1 capsule (75 mg total) by mouth 2 (two) times daily. 180 capsule 1  . vitamin B-12 (CYANOCOBALAMIN) 1000 MCG tablet Take 1,000 mcg by mouth daily.     Marland Kitchen zolpidem (AMBIEN CR) 6.25 MG CR tablet TAKE 1  TABLET BY MOUTH AT BEDTIME AS NEEDED FOR SLEEP 90 tablet 0   No current facility-administered medications for this visit.     Allergies  Allergen Reactions  . Bactrim [Sulfamethoxazole-Trimethoprim] Hives  . Gabapentin Other (See Comments)  . Tramadol Itching    Family History  Problem Relation Age of Onset  . Drug abuse Sister   . Alcohol abuse Sister   . Depression Sister   . Rheum arthritis Father   . Congestive Heart Failure Father   . Depression Father   . Diabetes Father   . Hearing loss Father   . Osteoarthritis Mother   . Stroke Mother   . Aneurysm Mother   . Breast cancer Maternal Aunt   . Heart attack Maternal Grandfather   . Diabetes  Brother   . Alcohol abuse Paternal Uncle   . Arthritis Sister   . Depression Sister   . Heart disease Sister   . Crohn's disease Sister     Social History   Socioeconomic History  . Marital status: Married    Spouse name: Not on file  . Number of children: Not on file  . Years of education: Not on file  . Highest education level: Not on file  Occupational History  . Not on file  Social Needs  . Financial resource strain: Not on file  . Food insecurity    Worry: Not on file    Inability: Not on file  . Transportation needs    Medical: Not on file    Non-medical: Not on file  Tobacco Use  . Smoking status: Never Smoker  . Smokeless tobacco: Never Used  Substance and Sexual Activity  . Alcohol use: No  . Drug use: No  . Sexual activity: Yes    Comment: Perimenopausal  Lifestyle  . Physical activity    Days per week: Not on file    Minutes per session: Not on file  . Stress: Not on file  Relationships  . Social Herbalist on phone: Not on file    Gets together: Not on file    Attends religious service: Not on file    Active member of club or organization: Not on file    Attends meetings of clubs or organizations: Not on file    Relationship status: Not on file  . Intimate partner violence    Fear of current or ex partner: Not on file    Emotionally abused: Not on file    Physically abused: Not on file    Forced sexual activity: Not on file  Other Topics Concern  . Not on file  Social History Narrative  . Not on file     Constitutional: Pt has a history of migraines. Denies fever, malaise, fatigue, or abrupt weight changes.  HEENT: Denies eye pain, eye redness, ear pain, ringing in the ears, wax buildup, runny nose, nasal congestion, bloody nose, or sore throat. Respiratory: Denies difficulty breathing, shortness of breath, cough or sputum production.   Cardiovascular: Denies chest pain, chest tightness, palpitations or swelling in the hands or feet.   Gastrointestinal: Pt has a history of reflux. Denies abdominal pain, bloating, constipation, diarrhea or blood in the stool.  GU: Denies urgency, frequency, pain with urination, burning sensation, blood in urine, odor or discharge. Musculoskeletal: Pt reports chronic muscle pain. Denies decrease in range of motion, difficulty with gait, or joint pain and swelling.  Skin: Denies redness, rashes, lesions or ulcercations.  Neurological: Pt reports insomnia. Denies  dizziness, difficulty with memory, difficulty with speech or problems with balance and coordination.  Psych: Pt has a history of anxiety and depression. Denies SI/HI.  No other specific complaints in a complete review of systems (except as listed in HPI above).     Objective:   Physical Exam   BP 122/78   Pulse 84   Temp 98.3 F (36.8 C) (Temporal)   Ht 4' 10.25" (1.48 m)   Wt 153 lb (69.4 kg)   SpO2 97%   BMI 31.70 kg/m   Wt Readings from Last 3 Encounters:  09/11/18 153 lb (69.4 kg)  04/20/18 149 lb (67.6 kg)  02/18/18 161 lb (73 kg)    General: Appears her stated age, obese, in NAD. Skin: Warm, dry and intact. No rashes noted. HEENT: Head: normal shape and size; Eyes: sclera white, no icterus, conjunctiva pink, PERRLA and EOMs intact; Ears: Tm's gray and intact, normal light reflex;  Neck:  Neck supple, trachea midline. No masses, lumps present.  Cardiovascular: Normal rate and rhythm. S1,S2 noted.  No murmur, rubs or gallops noted. No JVD or BLE edema. No carotid bruits noted. Pulmonary/Chest: Normal effort and positive vesicular breath sounds. No respiratory distress. No wheezes, rales or ronchi noted.  Abdomen: Soft and nontender. Normal bowel sounds. No distention or masses noted. Liver, spleen and kidneys non palpable. Musculoskeletal: Strength 5/5 BUE/BLE No difficulty with gait.  Neurological: Alert and oriented. Cranial nerves II-XII grossly intact. Coordination normal.  Psychiatric: Mood and affect normal.  Behavior is normal. Judgment and thought content normal.     BMET    Component Value Date/Time   NA 140 09/11/2018 1628   NA 139 05/29/2014 0516   K 4.0 09/11/2018 1628   K 3.6 05/29/2014 0516   CL 102 09/11/2018 1628   CL 104 05/29/2014 0516   CO2 26 09/11/2018 1628   CO2 24 05/29/2014 0516   GLUCOSE 75 09/11/2018 1628   GLUCOSE 130 (H) 05/29/2014 0516   BUN 9 09/11/2018 1628   BUN 7 05/29/2014 0516   CREATININE 0.87 09/11/2018 1628   CALCIUM 10.0 09/11/2018 1628   CALCIUM 8.6 05/29/2014 0516   GFRNONAA >60 06/16/2016 1828   GFRNONAA >60 05/29/2014 0516   GFRNONAA >60 06/28/2013 0511   GFRAA >60 06/16/2016 1828   GFRAA >60 05/29/2014 0516   GFRAA >60 06/28/2013 0511    Lipid Panel     Component Value Date/Time   CHOL 194 09/11/2018 1628   TRIG 130 09/11/2018 1628   HDL 60 09/11/2018 1628   CHOLHDL 3.2 09/11/2018 1628   VLDL 45.6 (H) 02/18/2018 1501   LDLCALC 109 (H) 09/11/2018 1628    CBC    Component Value Date/Time   WBC 8.0 02/18/2018 1501   RBC 4.72 02/18/2018 1501   HGB 13.8 02/18/2018 1501   HGB 13.9 05/29/2014 0516   HCT 40.9 02/18/2018 1501   HCT 41.3 05/29/2014 0516   PLT 302.0 02/18/2018 1501   PLT 227 05/29/2014 0516   MCV 86.7 02/18/2018 1501   MCV 89 05/29/2014 0516   MCH 30.4 06/16/2016 1828   MCHC 33.7 02/18/2018 1501   RDW 13.4 02/18/2018 1501   RDW 13.2 05/29/2014 0516   LYMPHSABS 1.9 12/30/2014 1425   LYMPHSABS 1.8 05/29/2014 0516   MONOABS 0.7 12/30/2014 1425   MONOABS 1.1 (H) 05/29/2014 0516   EOSABS 0.2 12/30/2014 1425   EOSABS 0.2 05/29/2014 0516   BASOSABS 0.1 12/30/2014 1425   BASOSABS 0.1 05/29/2014 0516  Hgb A1C No results found for: HGBA1C         Assessment & Plan:    Preventative Health Maintenance:  Encouraged her to get a flu shot in the fall Tetanus UTD Pap smear UTD- will request copy Mammogram UTD- will request copy Colon screening UTD- will request copy Encouraged her to consume a balanced diet  and exercise regimen CBC, CMP, A1C, Lipids, Vitamin D, Vitamin B12, TSH, Free T4 checked today.  Sensation of Pressure in Bladder Area:  UA collected- normal Will send urine culture Push fluids.  Return in 1 year for annual exam or sooner if needed.  [name], NP

## 2019-03-04 NOTE — Addendum Note (Signed)
Addended by: Lurlean Nanny on: 03/04/2019 01:01 PM   Modules accepted: Orders

## 2019-03-04 NOTE — Assessment & Plan Note (Addendum)
Managed with Ambien.   Will monitor

## 2019-03-04 NOTE — Assessment & Plan Note (Signed)
Chronic but stable on Nortriptyline and Lyrica. Encouraged getting routine weight bearing exercise and weight loss.

## 2019-03-04 NOTE — Patient Instructions (Signed)
Health Maintenance, Female Adopting a healthy lifestyle and getting preventive care are important in promoting health and wellness. Ask your health care provider about:  The right schedule for you to have regular tests and exams.  Things you can do on your own to prevent diseases and keep yourself healthy. What should I know about diet, weight, and exercise? Eat a healthy diet   Eat a diet that includes plenty of vegetables, fruits, low-fat dairy products, and lean protein.  Do not eat a lot of foods that are high in solid fats, added sugars, or sodium. Maintain a healthy weight Body mass index (BMI) is used to identify weight problems. It estimates body fat based on height and weight. Your health care provider can help determine your BMI and help you achieve or maintain a healthy weight. Get regular exercise Get regular exercise. This is one of the most important things you can do for your health. Most adults should:  Exercise for at least 150 minutes each week. The exercise should increase your heart rate and make you sweat (moderate-intensity exercise).  Do strengthening exercises at least twice a week. This is in addition to the moderate-intensity exercise.  Spend less time sitting. Even light physical activity can be beneficial. Watch cholesterol and blood lipids Have your blood tested for lipids and cholesterol at 51 years of age, then have this test every 5 years. Have your cholesterol levels checked more often if:  Your lipid or cholesterol levels are high.  You are older than 51 years of age.  You are at high risk for heart disease. What should I know about cancer screening? Depending on your health history and family history, you may need to have cancer screening at various ages. This may include screening for:  Breast cancer.  Cervical cancer.  Colorectal cancer.  Skin cancer.  Lung cancer. What should I know about heart disease, diabetes, and high blood  pressure? Blood pressure and heart disease  High blood pressure causes heart disease and increases the risk of stroke. This is more likely to develop in people who have high blood pressure readings, are of African descent, or are overweight.  Have your blood pressure checked: ? Every 3-5 years if you are 18-39 years of age. ? Every year if you are 40 years old or older. Diabetes Have regular diabetes screenings. This checks your fasting blood sugar level. Have the screening done:  Once every three years after age 40 if you are at a normal weight and have a low risk for diabetes.  More often and at a younger age if you are overweight or have a high risk for diabetes. What should I know about preventing infection? Hepatitis B If you have a higher risk for hepatitis B, you should be screened for this virus. Talk with your health care provider to find out if you are at risk for hepatitis B infection. Hepatitis C Testing is recommended for:  Everyone born from 1945 through 1965.  Anyone with known risk factors for hepatitis C. Sexually transmitted infections (STIs)  Get screened for STIs, including gonorrhea and chlamydia, if: ? You are sexually active and are younger than 51 years of age. ? You are older than 51 years of age and your health care provider tells you that you are at risk for this type of infection. ? Your sexual activity has changed since you were last screened, and you are at increased risk for chlamydia or gonorrhea. Ask your health care provider if   you are at risk.  Ask your health care provider about whether you are at high risk for HIV. Your health care provider may recommend a prescription medicine to help prevent HIV infection. If you choose to take medicine to prevent HIV, you should first get tested for HIV. You should then be tested every 3 months for as long as you are taking the medicine. Pregnancy  If you are about to stop having your period (premenopausal) and  you may become pregnant, seek counseling before you get pregnant.  Take 400 to 800 micrograms (mcg) of folic acid every day if you become pregnant.  Ask for birth control (contraception) if you want to prevent pregnancy. Osteoporosis and menopause Osteoporosis is a disease in which the bones lose minerals and strength with aging. This can result in bone fractures. If you are 65 years old or older, or if you are at risk for osteoporosis and fractures, ask your health care provider if you should:  Be screened for bone loss.  Take a calcium or vitamin D supplement to lower your risk of fractures.  Be given hormone replacement therapy (HRT) to treat symptoms of menopause. Follow these instructions at home: Lifestyle  Do not use any products that contain nicotine or tobacco, such as cigarettes, e-cigarettes, and chewing tobacco. If you need help quitting, ask your health care provider.  Do not use street drugs.  Do not share needles.  Ask your health care provider for help if you need support or information about quitting drugs. Alcohol use  Do not drink alcohol if: ? Your health care provider tells you not to drink. ? You are pregnant, may be pregnant, or are planning to become pregnant.  If you drink alcohol: ? Limit how much you use to 0-1 drink a day. ? Limit intake if you are breastfeeding.  Be aware of how much alcohol is in your drink. In the [place] one drink equals one 12 oz bottle of beer (355 mL), one 5 oz glass of wine (148 mL), or one 1 oz glass of hard liquor (44 mL). General instructions  Schedule regular health, dental, and eye exams.  Stay current with your vaccines.  Tell your health care provider if: ? You often feel depressed. ? You have ever been abused or do not feel safe at home. Summary  Adopting a healthy lifestyle and getting preventive care are important in promoting health and wellness.  Follow your health care provider's instructions about healthy  diet, exercising, and getting tested or screened for diseases.  Follow your health care provider's instructions on monitoring your cholesterol and blood pressure. This information is not intended to replace advice given to you by your health care provider. Make sure you discuss any questions you have with your health care provider. Document Released: 02/11/2011 Document Revised: 07/22/2018 Document Reviewed: 07/22/2018 Elsevier Patient Education  2020 [employer]  

## 2019-03-04 NOTE — Assessment & Plan Note (Signed)
Follows with GI. Takes Pantoprazole twice daily as prescribed- continue.

## 2019-03-04 NOTE — Assessment & Plan Note (Signed)
Patient unable to take Simvastatin due to myalgias. Encouraged low fat diet and exercise for weight loss. Lipids checked today.

## 2019-03-04 NOTE — Assessment & Plan Note (Addendum)
Follows with GI.   Continue Pantoprazole as prescribed. CBC, CMP today.

## 2019-03-04 NOTE — Assessment & Plan Note (Addendum)
Managed on current dose of Celexa.   Continue. Support offered today.

## 2019-03-04 NOTE — Assessment & Plan Note (Addendum)
Continue on current dose of Levothyroxine for now. TSH, Free T4 checked today.

## 2019-03-05 MED ORDER — LEVOTHYROXINE SODIUM 75 MCG PO TABS: 75.0000 ug | ORAL_TABLET | Freq: Every day | ORAL | 1 refills | Status: DC

## 2019-03-05 NOTE — Addendum Note (Signed)
Addended by: [name] on: 03/05/2019 08:03 PM   Modules accepted: Orders

## 2019-03-06 LAB — URINE CULTURE
MICRO NUMBER:: 697219
SPECIMEN QUALITY:: ADEQUATE

## 2019-03-08 MED ORDER — AMOXICILLIN-POT CLAVULANATE 875-125 MG PO TABS: 1.0000 | ORAL_TABLET | Freq: Two times a day (BID) | ORAL | 0 refills | Status: DC

## 2019-03-08 NOTE — Addendum Note (Signed)
Addended by: [name] on: 03/08/2019 11:42 AM   Modules accepted: Orders

## 2019-03-08 NOTE — Addendum Note (Signed)
Addended by: Ellamae Sia on: 03/08/2019 12:34 PM   Modules accepted: Orders

## 2019-03-11 MED ORDER — ROSUVASTATIN CALCIUM 10 MG PO TABS: 10.0000 mg | ORAL_TABLET | Freq: Every day | ORAL | 0 refills | Status: DC

## 2019-03-11 NOTE — Addendum Note (Signed)
Addended by: Lurlean Nanny on: 03/11/2019 04:53 PM   Modules accepted: Orders

## 2019-03-30 ENCOUNTER — Other Ambulatory Visit: Payer: Self-pay | Admitting: Internal Medicine

## 2019-04-01 ENCOUNTER — Telehealth: Payer: Self-pay

## 2019-04-01 NOTE — Telephone Encounter (Signed)
Left detailed VM w COVID screen and back door lab info   

## 2019-04-05 ENCOUNTER — Other Ambulatory Visit

## 2019-04-05 ENCOUNTER — Other Ambulatory Visit: Payer: Self-pay

## 2019-04-05 ENCOUNTER — Other Ambulatory Visit (INDEPENDENT_AMBULATORY_CARE_PROVIDER_SITE_OTHER)

## 2019-04-05 DIAGNOSIS — E7800 Pure hypercholesterolemia, unspecified: Secondary | ICD-10-CM | POA: Diagnosis not present

## 2019-04-05 DIAGNOSIS — E039 Hypothyroidism, unspecified: Secondary | ICD-10-CM | POA: Diagnosis not present

## 2019-04-05 LAB — COMPREHENSIVE METABOLIC PANEL
ALT: 25 U/L (ref 0–35)
AST: 22 U/L (ref 0–37)
Albumin: 4.8 g/dL (ref 3.5–5.2)
Alkaline Phosphatase: 125 U/L — ABNORMAL HIGH (ref 39–117)
BUN: 10 mg/dL (ref 6–23)
CO2: 28 mEq/L (ref 19–32)
Calcium: 10.1 mg/dL (ref 8.4–10.5)
Chloride: 103 mEq/L (ref 96–112)
Creatinine, Ser: 0.87 mg/dL (ref 0.40–1.20)
GFR: 68.63 mL/min (ref >60.00)
Glucose, Bld: 95 mg/dL (ref 70–99)
Potassium: 4.5 mEq/L (ref 3.5–5.1)
Sodium: 140 mEq/L (ref 135–145)
Total Bilirubin: 0.4 mg/dL (ref 0.2–1.2)
Total Protein: 7.5 g/dL (ref 6.0–8.3)

## 2019-04-05 LAB — LIPID PANEL
Cholesterol: 146 mg/dL (ref 0–200)
HDL: 54.7 mg/dL (ref >39.00)
LDL Cholesterol: 67 mg/dL (ref 0–99)
NonHDL: 91.68
Total CHOL/HDL Ratio: 3
Triglycerides: 123 mg/dL (ref 0.0–149.0)
VLDL: 24.6 mg/dL (ref 0.0–40.0)

## 2019-04-06 LAB — TSH: TSH: 2.13 u[IU]/mL (ref 0.35–4.50)

## 2019-05-20 ENCOUNTER — Other Ambulatory Visit: Payer: Self-pay | Admitting: Internal Medicine

## 2019-05-21 ENCOUNTER — Other Ambulatory Visit: Payer: Self-pay | Admitting: Internal Medicine

## 2019-05-21 NOTE — Telephone Encounter (Signed)
Patient stated that she is completely out of medication and would like this sent as soon as possible

## 2019-05-22 ENCOUNTER — Other Ambulatory Visit: Payer: Self-pay | Admitting: Internal Medicine

## 2019-05-22 DIAGNOSIS — E7800 Pure hypercholesterolemia, unspecified: Secondary | ICD-10-CM

## 2019-05-22 NOTE — Telephone Encounter (Signed)
Last filled 02/17/2019 90 day supply... please advise

## 2019-05-27 ENCOUNTER — Other Ambulatory Visit: Payer: Self-pay | Admitting: Internal Medicine

## 2019-05-27 DIAGNOSIS — E7800 Pure hypercholesterolemia, unspecified: Secondary | ICD-10-CM

## 2019-05-27 NOTE — Telephone Encounter (Signed)
Last filled 11/30/2018 #180 with 1 refill... CPE 02/2019... please advise

## 2019-05-31 NOTE — Addendum Note (Signed)
Addended by: Lurlean Nanny on: 05/31/2019 01:51 PM   Modules accepted: Orders

## 2019-06-07 ENCOUNTER — Other Ambulatory Visit

## 2019-07-12 ENCOUNTER — Other Ambulatory Visit (INDEPENDENT_AMBULATORY_CARE_PROVIDER_SITE_OTHER)

## 2019-07-12 ENCOUNTER — Other Ambulatory Visit: Payer: Self-pay

## 2019-07-12 DIAGNOSIS — E7800 Pure hypercholesterolemia, unspecified: Secondary | ICD-10-CM | POA: Diagnosis not present

## 2019-07-12 LAB — COMPREHENSIVE METABOLIC PANEL
ALT: 24 U/L (ref 0–35)
AST: 20 U/L (ref 0–37)
Albumin: 4.1 g/dL (ref 3.5–5.2)
Alkaline Phosphatase: 134 U/L — ABNORMAL HIGH (ref 39–117)
BUN: 8 mg/dL (ref 6–23)
CO2: 27 mEq/L (ref 19–32)
Calcium: 9.5 mg/dL (ref 8.4–10.5)
Chloride: 104 mEq/L (ref 96–112)
Creatinine, Ser: 0.76 mg/dL (ref 0.40–1.20)
GFR: 80.13 mL/min (ref >60.00)
Glucose, Bld: 135 mg/dL — ABNORMAL HIGH (ref 70–99)
Potassium: 3.7 mEq/L (ref 3.5–5.1)
Sodium: 138 mEq/L (ref 135–145)
Total Bilirubin: 0.4 mg/dL (ref 0.2–1.2)
Total Protein: 6.8 g/dL (ref 6.0–8.3)

## 2019-07-12 LAB — LIPID PANEL
Cholesterol: 130 mg/dL (ref 0–200)
HDL: 47.3 mg/dL (ref >39.00)
LDL Cholesterol: 48 mg/dL (ref 0–99)
NonHDL: 83.12
Total CHOL/HDL Ratio: 3
Triglycerides: 178 mg/dL — ABNORMAL HIGH (ref 0.0–149.0)
VLDL: 35.6 mg/dL (ref 0.0–40.0)

## 2019-08-02 ENCOUNTER — Other Ambulatory Visit: Payer: Self-pay | Admitting: Internal Medicine

## 2019-08-02 NOTE — Telephone Encounter (Signed)
Patient called to get her prescriptions for Zolpidem and Levothyroxine 75 mcg transferred from [facility] to [place].

## 2019-08-03 MED ORDER — ZOLPIDEM TARTRATE ER 6.25 MG PO TBCR: 6.2500 mg | EXTENDED_RELEASE_TABLET | Freq: Every evening | ORAL | 0 refills | Status: DC | PRN

## 2019-08-03 MED ORDER — LEVOTHYROXINE SODIUM 75 MCG PO TABS: 75.0000 ug | ORAL_TABLET | Freq: Every day | ORAL | 1 refills | Status: DC

## 2019-08-03 NOTE — Telephone Encounter (Signed)
Ambien last filled 05/24/2019, for mail order please advise

## 2019-09-14 ENCOUNTER — Telehealth: Payer: Self-pay | Admitting: *Deleted

## 2019-09-14 NOTE — Telephone Encounter (Signed)
Have her set up appt to discuss, virtual is fine.

## 2019-09-14 NOTE — Telephone Encounter (Signed)
Patient left a voicemail requesting lab work to have her thyroid checked again. Patient stated that she is noticing headaches and fatigue. Patient requested a call back.

## 2019-09-15 ENCOUNTER — Ambulatory Visit (INDEPENDENT_AMBULATORY_CARE_PROVIDER_SITE_OTHER): Admitting: Internal Medicine

## 2019-09-15 DIAGNOSIS — R5383 Other fatigue: Secondary | ICD-10-CM | POA: Diagnosis not present

## 2019-09-15 DIAGNOSIS — E039 Hypothyroidism, unspecified: Secondary | ICD-10-CM

## 2019-09-15 DIAGNOSIS — G43019 Migraine without aura, intractable, without status migrainosus: Secondary | ICD-10-CM | POA: Diagnosis not present

## 2019-09-15 NOTE — Progress Notes (Signed)
Virtual Visit via Video Note  I connected with Grace Shepard on 09/15/19 at  4:30 PM EST by a video enabled telemedicine application and verified that I am speaking with the correct person using two identifiers.  Location: Patient: Home Provider: Office   I discussed the limitations of evaluation and management by telemedicine and the availability of in person appointments. The patient expressed understanding and agreed to proceed.  History of Present Illness:  Pt reports fatigue and headaches. This started 1 week ago.  The headache is located in her temples and the top of her head.  She describes the pain as pressure.  She denies dizziness, visual changes, sensitivity to light or sound, nausea or vomiting.  She does report some runny nose, itchy ears and postnasal drip but attributes this to chronic allergies.  She denies neck or shoulder pain.  She denies nasal congestion, ear pain, sore throat, cough or shortness of breath.  She has a history of migraines, currently taking Nortriptyline for the same. She reports she typically sleeps 7 to 9 hours per night. She does feel rested when she wakes up.  She does snore at night but no reported apnea spells or waking up gasping for air.  She has a family history of OSA but has never been diagnosed.  She had a sleep study more than 5 years ago.  She denies anxiety or depression.  She has tried Tylenol and Excedrin with minimal relief.  She would also like to have her thyroid levels checked today.   Past Medical History:  Diagnosis Date  . Allergic rhinitis   . Arthritis   . Depression   . Fibromyalgia   . Hyperlipidemia   . Ischemic colitis ([place])   . Migraine   . Thyroid disease   . UTI (lower urinary tract infection)     Current Outpatient Medications  Medication Sig Dispense Refill  . albuterol (PROVENTIL HFA;VENTOLIN HFA) 108 (90 Base) MCG/ACT inhaler Inhale into the lungs.    Marland Kitchen amoxicillin-clavulanate (AUGMENTIN) 875-125 MG tablet  Take 1 tablet by mouth 2 (two) times daily. 20 tablet 0  . cetirizine (ZYRTEC) 10 MG tablet Take 10 mg by mouth daily.    . Cholecalciferol (VITAMIN D3) 125 MCG (5000 UT) CAPS Take by mouth.    . citalopram (CELEXA) 40 MG tablet TAKE 1 TABLET DAILY 90 tablet 2  . estradiol (ESTRACE) 0.1 MG/GM vaginal cream Place 1 Applicatorful vaginally at bedtime. Insert 1g nightly for 1 wk, then 1 g once weekly as maintenace 42.5 g 1  . levothyroxine (SYNTHROID) 75 MCG tablet Take 1 tablet (75 mcg total) by mouth daily. 90 tablet 1  . montelukast (SINGULAIR) 10 MG tablet TAKE 1 TABLET AT BEDTIME 90 tablet 3  . nortriptyline (PAMELOR) 10 MG capsule     . pantoprazole (PROTONIX) 40 MG tablet Take 1 tablet (40 mg total) by mouth daily. 90 tablet 2  . pregabalin (LYRICA) 75 MG capsule TAKE 1 CAPSULE TWICE A DAY 180 capsule 1  . rosuvastatin (CRESTOR) 10 MG tablet TAKE 1 TABLET DAILY 90 tablet 2  . vitamin B-12 (CYANOCOBALAMIN) 1000 MCG tablet Take 1,000 mcg by mouth every other day.     . zolpidem (AMBIEN CR) 6.25 MG CR tablet Take 1 tablet (6.25 mg total) by mouth at bedtime as needed. for sleep 90 tablet 0   No current facility-administered medications for this visit.    Allergies  Allergen Reactions  . Bactrim [Sulfamethoxazole-Trimethoprim] Hives  . Gabapentin Other (See  Comments)  . Tramadol Itching    Family History  Problem Relation Age of Onset  . Drug abuse Sister   . Alcohol abuse Sister   . Depression Sister   . Rheum arthritis Father   . Congestive Heart Failure Father   . Depression Father   . Diabetes Father   . Hearing loss Father   . Osteoarthritis Mother   . Stroke Mother   . Aneurysm Mother   . Breast cancer Maternal Aunt   . Heart attack Maternal Grandfather   . Diabetes Brother   . Alcohol abuse Paternal Uncle   . Arthritis Sister   . Depression Sister   . Heart disease Sister   . Crohn's disease Sister     Social History   Socioeconomic History  . Marital status:  Married    Spouse name: Not on file  . Number of children: Not on file  . Years of education: Not on file  . Highest education level: Not on file  Occupational History  . Not on file  Tobacco Use  . Smoking status: Never Smoker  . Smokeless tobacco: Never Used  Substance and Sexual Activity  . Alcohol use: No  . Drug use: No  . Sexual activity: Yes    Comment: Perimenopausal  Other Topics Concern  . Not on file  Social History Narrative  . Not on file   Social Determinants of Health   Financial Resource Strain:   . Difficulty of Paying Living Expenses: Not on file  Food Insecurity:   . Worried About Charity fundraiser in the Last Year: Not on file  . Ran Out of Food in the Last Year: Not on file  Transportation Needs:   . Lack of Transportation (Medical): Not on file  . Lack of Transportation (Non-Medical): Not on file  Physical Activity:   . Days of Exercise per Week: Not on file  . Minutes of Exercise per Session: Not on file  Stress:   . Feeling of Stress : Not on file  Social Connections:   . Frequency of Communication with Friends and Family: Not on file  . Frequency of Social Gatherings with Friends and Family: Not on file  . Attends Religious Services: Not on file  . Active Member of Clubs or Organizations: Not on file  . Attends Archivist Meetings: Not on file  . Marital Status: Not on file  Intimate Partner Violence:   . Fear of Current or Ex-Partner: Not on file  . Emotionally Abused: Not on file  . Physically Abused: Not on file  . Sexually Abused: Not on file     Constitutional: Pt reports fatigue, headaches. Denies fever, malaise,or abrupt weight changes.  HEENT: Pt reports runny nose, itchy ears and post nasal drip. Denies eye pain, eye redness, ear pain, ringing in the ears, wax buildup, nasal congestion, bloody nose, or sore throat. Respiratory: Denies difficulty breathing, shortness of breath, cough or sputum production.    Cardiovascular: Denies chest pain, chest tightness, palpitations or swelling in the hands or feet.  Musculoskeletal: Denies decrease in range of motion, difficulty with gait, muscle pain or joint pain and swelling.  Skin: Denies redness, rashes, lesions or ulcercations.  Neurological: Denies dizziness, difficulty with memory, difficulty with speech or problems with balance and coordination.  Psych: Denies anxiety, depression, SI/HI.  No other specific complaints in a complete review of systems (except as listed in HPI above).    Observations/Objective:   Wt Readings from  Last 3 Encounters:  03/04/19 153 lb (69.4 kg)  09/11/18 153 lb (69.4 kg)  04/20/18 149 lb (67.6 kg)    General: Appears her stated age, obese, in NAD. HEENT: Head: normal shape and size; Eyes: EOMs intact;  Pulmonary/Chest: Normal effort. No respiratory distress.  Neurological: Alert and oriented.  Coordination normal.    BMET    Component Value Date/Time   NA 138 07/12/2019 1051   NA 139 05/29/2014 0516   K 3.7 07/12/2019 1051   K 3.6 05/29/2014 0516   CL 104 07/12/2019 1051   CL 104 05/29/2014 0516   CO2 27 07/12/2019 1051   CO2 24 05/29/2014 0516   GLUCOSE 135 (H) 07/12/2019 1051   GLUCOSE 130 (H) 05/29/2014 0516   BUN 8 07/12/2019 1051   BUN 7 05/29/2014 0516   CREATININE 0.76 07/12/2019 1051   CREATININE 0.87 09/11/2018 1628   CALCIUM 9.5 07/12/2019 1051   CALCIUM 8.6 05/29/2014 0516   GFRNONAA >60 06/16/2016 1828   GFRNONAA >60 05/29/2014 0516   GFRNONAA >60 06/28/2013 0511   GFRAA >60 06/16/2016 1828   GFRAA >60 05/29/2014 0516   GFRAA >60 06/28/2013 0511    Lipid Panel     Component Value Date/Time   CHOL 130 07/12/2019 1051   TRIG 178.0 (H) 07/12/2019 1051   HDL 47.30 07/12/2019 1051   CHOLHDL 3 07/12/2019 1051   VLDL 35.6 07/12/2019 1051   LDLCALC 48 07/12/2019 1051   LDLCALC 109 (H) 09/11/2018 1628    CBC    Component Value Date/Time   WBC 6.8 03/04/2019 1134   RBC 4.63  03/04/2019 1134   HGB 13.7 03/04/2019 1134   HGB 13.9 05/29/2014 0516   HCT 40.6 03/04/2019 1134   HCT 41.3 05/29/2014 0516   PLT 280.0 03/04/2019 1134   PLT 227 05/29/2014 0516   MCV 87.7 03/04/2019 1134   MCV 89 05/29/2014 0516   MCH 30.4 06/16/2016 1828   MCHC 33.7 03/04/2019 1134   RDW 13.2 03/04/2019 1134   RDW 13.2 05/29/2014 0516   LYMPHSABS 1.9 12/30/2014 1425   LYMPHSABS 1.8 05/29/2014 0516   MONOABS 0.7 12/30/2014 1425   MONOABS 1.1 (H) 05/29/2014 0516   EOSABS 0.2 12/30/2014 1425   EOSABS 0.2 05/29/2014 0516   BASOSABS 0.1 12/30/2014 1425   BASOSABS 0.1 05/29/2014 0516    Hgb A1C Lab Results  Component Value Date   HGBA1C 5.7 03/04/2019       Assessment and Plan:  Migraines:  Combination of sinus vs tension Consider increasing Nortriptyline to 20 mg pending labs Continue Excedrin as needed  Fatigue:  Will check TSH, Free T4, Vit D and B12 today Consider sleep study pending labs  Hypothyroidism:  Will check TSH and Free T4 today Will adjust Levothyroxine if needed based on labs  Follow Up Instructions:    I discussed the assessment and treatment plan with the patient. The patient was provided an opportunity to ask questions and all were answered. The patient agreed with the plan and demonstrated an understanding of the instructions.   The patient was advised to call back or seek an in-person evaluation if the symptoms worsen or if the condition fails to improve as anticipated.     [name], NP

## 2019-09-15 NOTE — Telephone Encounter (Signed)
Pt has a doxy scheduled

## 2019-09-16 ENCOUNTER — Encounter: Payer: Self-pay | Admitting: Internal Medicine

## 2019-09-16 NOTE — Patient Instructions (Signed)

## 2019-10-19 ENCOUNTER — Other Ambulatory Visit: Payer: Self-pay | Admitting: Internal Medicine

## 2019-10-28 ENCOUNTER — Other Ambulatory Visit: Payer: Self-pay

## 2019-10-28 ENCOUNTER — Other Ambulatory Visit (INDEPENDENT_AMBULATORY_CARE_PROVIDER_SITE_OTHER)

## 2019-10-28 DIAGNOSIS — G43019 Migraine without aura, intractable, without status migrainosus: Secondary | ICD-10-CM

## 2019-10-28 DIAGNOSIS — R5383 Other fatigue: Secondary | ICD-10-CM | POA: Diagnosis not present

## 2019-10-28 LAB — T4, FREE: Free T4: 0.88 ng/dL (ref 0.60–1.60)

## 2019-10-28 LAB — TSH: TSH: 0.37 u[IU]/mL (ref 0.35–4.50)

## 2019-10-28 LAB — VITAMIN B12: Vitamin B-12: 566 pg/mL (ref 211–911)

## 2019-10-28 LAB — VITAMIN D 25 HYDROXY (VIT D DEFICIENCY, FRACTURES): VITD: 71.79 ng/mL (ref 30.00–100.00)

## 2019-11-08 ENCOUNTER — Ambulatory Visit (INDEPENDENT_AMBULATORY_CARE_PROVIDER_SITE_OTHER): Admitting: Dermatology

## 2019-11-08 ENCOUNTER — Other Ambulatory Visit: Payer: Self-pay

## 2019-11-08 DIAGNOSIS — L814 Other melanin hyperpigmentation: Secondary | ICD-10-CM | POA: Diagnosis not present

## 2019-11-08 DIAGNOSIS — R21 Rash and other nonspecific skin eruption: Secondary | ICD-10-CM | POA: Diagnosis not present

## 2019-11-08 MED ORDER — TRIAMCINOLONE ACETONIDE 0.1 % EX CREA: 1.0000 "application " | TOPICAL_CREAM | Freq: Two times a day (BID) | CUTANEOUS | 0 refills | Status: DC | PRN

## 2019-11-08 NOTE — Progress Notes (Signed)
   Follow-Up Visit   Subjective  Grace Shepard is a 54 y.o. female who presents for the following: check spot (L breast x 19months, no symptoms or txt), check spot (R nasal bridge x 3wks, no symptoms, getting darker), and discuss laser txt (for face).  The following portions of the chart were reviewed this encounter and updated as appropriate:     Review of Systems: No other skin or systemic complaints.  Objective  Well appearing patient in no apparent distress; mood and affect are within normal limits.  A focused examination was performed including face, chest. Relevant physical exam findings are noted in the Assessment and Plan.  Objective  R nasal dorsum, face: 7.0 x 5.27mm light brown macule R nasal dorsum Scattered tan macules face  Objective  Left medial breast: Vioaleous shiny slightly atrophic patch 1.5 x 1.0cm  Images    Assessment & Plan  Lentigines R nasal dorsum, face  Benign, observe.   Discussed Laser txt, pt will schedule consult with laser technician. Recommend daily broad spectrum sunscreen SPF 30+ to sun-exposed areas, reapply every 2 hours as needed. Call for new or changing lesions.   Rash Left medial breast  LS&A vs Lichen Planus  Start TMC 0.1% cr bid aa, avoid f/g/a Consider biopsy if not improved on f/up  triamcinolone cream (KENALOG) 0.1 % - Left medial breast  Return in about 4 weeks (around 12/06/2019) for recheck LS&A vs Lichen planus.

## 2019-11-09 ENCOUNTER — Ambulatory Visit (INDEPENDENT_AMBULATORY_CARE_PROVIDER_SITE_OTHER): Payer: Self-pay

## 2019-11-09 DIAGNOSIS — L578 Other skin changes due to chronic exposure to nonionizing radiation: Secondary | ICD-10-CM

## 2019-11-15 ENCOUNTER — Other Ambulatory Visit: Payer: Self-pay | Admitting: Internal Medicine

## 2019-11-15 NOTE — Telephone Encounter (Signed)
Patient called about the refill request She stated she is still needing the [facility] sent to [place]   But she is requesting the medications below to be sent to [facility]   Levothyroxine   Rosuvastin

## 2019-11-15 NOTE — Telephone Encounter (Signed)
Last filled 08/02/2020...Marland Kitchen please advise

## 2019-12-03 ENCOUNTER — Other Ambulatory Visit: Payer: Self-pay

## 2019-12-03 ENCOUNTER — Ambulatory Visit
Admission: EM | Admit: 2019-12-03 | Discharge: 2019-12-03 | Disposition: A | Discharge status: Home / Self Care | Attending: Family Medicine | Admitting: Family Medicine

## 2019-12-03 DIAGNOSIS — Z885 Allergy status to narcotic agent status: Secondary | ICD-10-CM | POA: Diagnosis not present

## 2019-12-03 DIAGNOSIS — Z8249 Family history of ischemic heart disease and other diseases of the circulatory system: Secondary | ICD-10-CM | POA: Diagnosis not present

## 2019-12-03 DIAGNOSIS — Z20822 Contact with and (suspected) exposure to covid-19: Secondary | ICD-10-CM | POA: Insufficient documentation

## 2019-12-03 DIAGNOSIS — G43009 Migraine without aura, not intractable, without status migrainosus: Secondary | ICD-10-CM | POA: Diagnosis not present

## 2019-12-03 DIAGNOSIS — E039 Hypothyroidism, unspecified: Secondary | ICD-10-CM | POA: Diagnosis not present

## 2019-12-03 DIAGNOSIS — Z79899 Other long term (current) drug therapy: Secondary | ICD-10-CM | POA: Insufficient documentation

## 2019-12-03 DIAGNOSIS — Z833 Family history of diabetes mellitus: Secondary | ICD-10-CM | POA: Diagnosis not present

## 2019-12-03 DIAGNOSIS — Z888 Allergy status to other drugs, medicaments and biological substances status: Secondary | ICD-10-CM | POA: Diagnosis not present

## 2019-12-03 DIAGNOSIS — E785 Hyperlipidemia, unspecified: Secondary | ICD-10-CM | POA: Diagnosis not present

## 2019-12-03 DIAGNOSIS — Z881 Allergy status to other antibiotic agents status: Secondary | ICD-10-CM | POA: Insufficient documentation

## 2019-12-03 DIAGNOSIS — K219 Gastro-esophageal reflux disease without esophagitis: Secondary | ICD-10-CM | POA: Insufficient documentation

## 2019-12-03 DIAGNOSIS — G44201 Tension-type headache, unspecified, intractable: Secondary | ICD-10-CM | POA: Insufficient documentation

## 2019-12-03 DIAGNOSIS — M797 Fibromyalgia: Secondary | ICD-10-CM | POA: Diagnosis not present

## 2019-12-03 DIAGNOSIS — Z8261 Family history of arthritis: Secondary | ICD-10-CM | POA: Insufficient documentation

## 2019-12-03 DIAGNOSIS — R519 Headache, unspecified: Secondary | ICD-10-CM | POA: Diagnosis present

## 2019-12-03 NOTE — Discharge Instructions (Signed)
Take muscle relaxer and vicodin that you have at home Do not take [place]

## 2019-12-03 NOTE — ED Triage Notes (Signed)
Pt presents with c/o headache for several weeks. She did see neurology recently and they changed her medications. Pt is concerned she may have COVID as someone her husband knows had COVID and their only symptom was a headache. Pt states none of her regular medications are helping this current headache. She has been taking all meds as prescribed, even the prednisone. She has been tapering up on the Nortriptyline as directed. Pt reports her headache is located all over the top of her head and down the back right side. Pt is unsure if she has history of migraines. Pt states she does have photosensitivity and audio sensitivity. Pt has been adding Tylenol and Ibuprofen to try and relieve headache.

## 2019-12-04 LAB — SARS CORONAVIRUS 2 (TAT 6-24 HRS): SARS Coronavirus 2: NEGATIVE

## 2019-12-05 NOTE — ED Provider Notes (Signed)
[facility]    CSN: [id number] Arrival date & time: 12/03/19  1856      History   Chief Complaint Chief Complaint  Patient presents with  . Headache    x3 weeks    HPI Grace Shepard is a 54 y.o. female.   54 yo female with a c/o headache for several weeks unrelieved with her usual medications. Patient has a h/o migraine headaches and is seeing a neurologist. Her neurologist recently increased her nortriptyline and prescribed prednisone, however this has not helped. Patient reports headache all around her head and back of the neck on the right side. Denies any fevers, chills, nausea, vomiting, vision changes, numbness/tinging, unilateral weakness.    Headache   Past Medical History:  Diagnosis Date  . Allergic rhinitis   . Arthritis   . Depression   . Fibromyalgia   . Hyperlipidemia   . Ischemic colitis ([place])   . Migraine   . Thyroid disease   . UTI (lower urinary tract infection)     Patient Active Problem List   Diagnosis Date Noted  . HLD (hyperlipidemia) 03/04/2019  . Post-menopause 04/20/2018  . Anxiety and depression 05/20/2016  . Hypothyroidism 12/04/2015  . GERD (gastroesophageal reflux disease) 06/15/2015  . Seasonal allergies 05/22/2015  . Insomnia 05/22/2015  . Fibromyalgia 04/21/2015  . Migraines 04/21/2015    Past Surgical History:  Procedure Laterality Date  . TUBAL LIGATION      OB History   No obstetric history on file.      Home Medications    Prior to Admission medications   Medication Sig Start Date End Date Taking? Authorizing Provider  albuterol (PROVENTIL HFA;VENTOLIN HFA) 108 (90 Base) MCG/ACT inhaler Inhale into the lungs.   Yes [provider]  cetirizine (ZYRTEC) 10 MG tablet Take 10 mg by mouth daily.   Yes [provider]  Cholecalciferol (VITAMIN D3) 125 MCG (5000 UT) CAPS Take by mouth.   Yes [provider]  citalopram (CELEXA) 40 MG tablet TAKE 1 TABLET DAILY 03/31/19  Yes  Baity, Coralie Keens, NP  levothyroxine (SYNTHROID) 75 MCG tablet Take 1 tablet (75 mcg total) by mouth daily. 08/03/19  Yes [name], NP  methylPREDNISolone (MEDROL DOSEPAK) 4 MG TBPK tablet As directed 11/29/19  Yes [provider]  montelukast (SINGULAIR) 10 MG tablet TAKE 1 TABLET AT BEDTIME 02/13/19  Yes 71, Coralie Keens, NP  nortriptyline (PAMELOR) 10 MG capsule 10 mg. Take as directed 12/29/17  Yes [provider]  pantoprazole (PROTONIX) 40 MG tablet Take 1 tablet (40 mg total) by mouth daily. 02/18/18  Yes [name], NP  pregabalin (LYRICA) 75 MG capsule TAKE 1 CAPSULE TWICE A DAY 05/28/19  Yes [name], NP  rosuvastatin (CRESTOR) 10 MG tablet TAKE 1 TABLET DAILY 05/24/19  Yes [name], NP  triamcinolone cream (KENALOG) 0.1 % Apply 1 application topically 2 (two) times daily as needed. Avoid face, groin, axilla 11/08/19  Yes Brendolyn Patty, MD  vitamin B-12 (CYANOCOBALAMIN) 1000 MCG tablet Take 1,000 mcg by mouth every other day.    Yes [provider]  zolpidem (AMBIEN CR) 6.25 MG CR tablet TAKE 1 TABLET BY MOUTH AT BEDTIME AS NEEDED FOR SLEEP 11/15/19  Yes 73, Coralie Keens, NP    Family History Family History  Problem Relation Age of Onset  . Drug abuse Sister   . Alcohol abuse Sister   . Depression Sister   . Rheum arthritis Father   .  Congestive Heart Failure Father   . Depression Father   . Diabetes Father   . Hearing loss Father   . Osteoarthritis Mother   . Stroke Mother   . Aneurysm Mother   . Breast cancer Maternal Aunt   . Heart attack Maternal Grandfather   . Diabetes Brother   . Alcohol abuse Paternal Uncle   . Arthritis Sister   . Depression Sister   . Heart disease Sister   . Crohn's disease Sister     Social History Social History   Tobacco Use  . Smoking status: Never Smoker  . Smokeless tobacco: Never Used  Substance Use Topics  . Alcohol use: No  . Drug use: No     Allergies   Bactrim  [sulfamethoxazole-trimethoprim], Gabapentin, and Tramadol   Review of Systems Review of Systems  Neurological: Positive for headaches.     Physical Exam Triage Vital Signs ED Triage Vitals  Enc Vitals Group     BP 12/03/19 1910 (!) 138/96     Pulse Rate 12/03/19 1910 84     Resp 12/03/19 1910 18     Temp 12/03/19 1910 98.1 F (36.7 C)     Temp Source 12/03/19 1910 Oral     SpO2 12/03/19 1910 99 %     Weight 12/03/19 1908 150 lb (68 kg)     Height 12/03/19 1908 4' 10.5" (1.486 m)     Head Circumference --      Peak Flow --      Pain Score 12/03/19 1907 8     Pain Loc --      Pain Edu? --      Excl. in [place]? --    No data found.  Updated Vital Signs BP (!) 138/96 (BP Location: Left Arm)   Pulse 84   Temp 98.1 F (36.7 C) (Oral)   Resp 18   Ht 4' 10.5" (1.486 m)   Wt 68 kg   SpO2 99%   BMI 30.82 kg/m   Visual Acuity Right Eye Distance:   Left Eye Distance:   Bilateral Distance:    Right Eye Near:   Left Eye Near:    Bilateral Near:     Physical Exam Vitals and nursing note reviewed.  Constitutional:      General: She is not in acute distress.    Appearance: She is not toxic-appearing or diaphoretic.  HENT:     Head: Normocephalic and atraumatic.     Mouth/Throat:     Mouth: Mucous membranes are moist.  Eyes:     Extraocular Movements: Extraocular movements intact.     Pupils: Pupils are equal, round, and reactive to light.  Cardiovascular:     Rate and Rhythm: Normal rate.     Heart sounds: Normal heart sounds.  Pulmonary:     Effort: Pulmonary effort is normal.     Breath sounds: Normal breath sounds.  Musculoskeletal:     Cervical back: Normal range of motion and neck supple. Spasms and tenderness (trapezius, right and cervical paraspinous) present. No swelling, edema, deformity, erythema, signs of trauma, lacerations, rigidity, bony tenderness or crepitus.       Back:  Neurological:     Mental Status: She is alert and oriented to person, place,  and time.     Cranial Nerves: No cranial nerve deficit, dysarthria or facial asymmetry.     Sensory: No sensory deficit.     Motor: No weakness.     Coordination: Coordination normal.  Gait: Gait normal.     Deep Tendon Reflexes: Reflexes normal.  Psychiatric:        Mood and Affect: Mood normal.        Speech: Speech normal.        Behavior: Behavior normal.      UC Treatments / Results  Labs (all labs ordered are listed, but only abnormal results are displayed) Labs Reviewed  SARS CORONAVIRUS 2 (TAT 6-24 HRS)    EKG   Radiology No results found.  Procedures Procedures (including critical care time)  Medications Ordered in UC Medications - No data to display  Initial Impression / Assessment and Plan / UC Course  I have reviewed the triage vital signs and the nursing notes.  Pertinent labs & imaging results that were available during my care of the patient were reviewed by me and considered in my medical decision making (see chart for details).      Final Clinical Impressions(s) / UC Diagnoses   Final diagnoses:  Acute intractable tension-type headache  Migraine without aura and without status migrainosus, not intractable     Discharge Instructions     Take muscle relaxer and vicodin that you have at home Do not take [facility]     ED Prescriptions    None      1. diagnosis reviewed with patient 2. Recommend patient take the robaxin and vicodin she has at home 3. Follow up with neurologist next week 4. Follow-up prn if symptoms worsen or don't improve   PDMP not reviewed this encounter.   Norval Gable, MD 12/05/19 [phone]

## 2019-12-07 ENCOUNTER — Ambulatory Visit (INDEPENDENT_AMBULATORY_CARE_PROVIDER_SITE_OTHER): Payer: Self-pay

## 2019-12-07 ENCOUNTER — Other Ambulatory Visit: Payer: Self-pay

## 2019-12-07 DIAGNOSIS — Z872 Personal history of diseases of the skin and subcutaneous tissue: Secondary | ICD-10-CM

## 2019-12-07 DIAGNOSIS — L578 Other skin changes due to chronic exposure to nonionizing radiation: Secondary | ICD-10-CM

## 2019-12-07 DIAGNOSIS — I781 Nevus, non-neoplastic: Secondary | ICD-10-CM

## 2019-12-10 ENCOUNTER — Ambulatory Visit (INDEPENDENT_AMBULATORY_CARE_PROVIDER_SITE_OTHER): Admitting: Dermatology

## 2019-12-10 ENCOUNTER — Other Ambulatory Visit: Payer: Self-pay

## 2019-12-10 DIAGNOSIS — L578 Other skin changes due to chronic exposure to nonionizing radiation: Secondary | ICD-10-CM

## 2019-12-10 DIAGNOSIS — L920 Granuloma annulare: Secondary | ICD-10-CM

## 2019-12-10 DIAGNOSIS — D485 Neoplasm of uncertain behavior of skin: Secondary | ICD-10-CM

## 2019-12-10 HISTORY — DX: Granuloma annulare: L92.0

## 2019-12-10 NOTE — Patient Instructions (Signed)

## 2019-12-10 NOTE — Progress Notes (Signed)
   Follow-Up Visit   Subjective  Grace Shepard is a 54 y.o. female who presents for the following: Rash (L med breast - patient using TMC 0.1% cream about once a day QOD instead of BID. She hasn't noticed an improvement with treatment).  The following portions of the chart were reviewed this encounter and updated as appropriate:     Review of Systems:  No other skin or systemic complaints except as noted in HPI or Assessment and Plan.  Objective  Well appearing patient in no apparent distress; mood and affect are within normal limits.  A focused examination was performed including left med breast. Relevant physical exam findings are noted in the Assessment and Plan.  Objective  L med breast: Pink white atrophic patch    Assessment & Plan  Neoplasm of uncertain behavior of skin L med breast  Skin / nail biopsy Type of biopsy: punch   Informed consent: discussed and consent obtained   Patient was prepped and draped in usual sterile fashion: area prepped with alcohol. Anesthesia: the lesion was anesthetized in a standard fashion   Anesthetic:  1% lidocaine w/ epinephrine 1-100,000 buffered w/ 8.4% NaHCO3 Punch size:  3.5 mm Suture size:  4-0 Suture type: nylon   Hemostasis achieved with: suture and pressure   Outcome: patient tolerated procedure well   Post-procedure details: wound care instructions given   Post-procedure details comment:  Ointment and small bandage applied  Specimen 1 - Surgical pathology Differential Diagnosis: 0000000 r/o LS et A vs lichen planus vs lichenoid keratosis vs other  Check Margins: No  Actinic Damage - diffuse scaly erythematous macules with underlying dyspigmentation - Recommend daily broad spectrum sunscreen SPF 30+ to sun-exposed areas, reapply every 2 hours as needed.  - Call for new or changing lesions.   Return in about 1 week (around 12/17/2019) for nurse visit - suture removal.  I, Rudell Cobb, CMA, am acting as scribe for Brendolyn Patty, MD .  Documentation: I have reviewed the above documentation for accuracy and completeness, and I agree with the above.  Brendolyn Patty, MD

## 2019-12-15 ENCOUNTER — Telehealth: Payer: Self-pay

## 2019-12-15 NOTE — Telephone Encounter (Signed)
-----   Message from Brendolyn Patty, MD sent at 12/14/2019  6:23 PM EDT ----- Skin , left med breast PALISADED GRANULOMA, COMPATIBLE WITH GRANULOMA ANNULARE  Benign inflammatory condition of skin.  Recommend treatment with topical steroid as prescribed once biopsy site healed.

## 2019-12-15 NOTE — Telephone Encounter (Signed)
Advised pt of bx results.  Pt is scheduled for suture removal on Friday and she would like to cancel her appointment and take the suture out herself.  She advised she has done this before.  I advised I would remove her from schedule.  Also advised once bx site completely healed she could start the topical steroid cream that was prescribed./sh

## 2019-12-17 ENCOUNTER — Ambulatory Visit: Payer: Self-pay

## 2019-12-21 ENCOUNTER — Ambulatory Visit: Admitting: Pain Medicine

## 2020-01-11 ENCOUNTER — Ambulatory Visit

## 2020-01-14 NOTE — Progress Notes (Signed)
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 Exam  Skin & Axial Inspection: No masses, redness, or swelling Alignment: Symmetrical Functional ROM: Unrestricted ROM Stability: No instability detected Muscle Tone/Strength: Functionally intact. No obvious neuro-muscular anomalies detected. Sensory (Neurological): Unimpaired Muscle strength & Tone: No palpable anomalies  Lumbar Exam  Skin & Axial Inspection: No masses, redness, or swelling Alignment: Symmetrical Functional ROM: Decreased ROM       Stability: No instability detected Muscle Tone/Strength: Guarding observed Sensory (Neurological): Movement-associated pain Palpation: Complains of area being tender to palpation  Provocative Tests: Hyperextension/rotation test: (+) bilaterally for facet joint pain. Lumbar quadrant test (Kemp's test): (+) bilaterally for facet joint pain. Lateral bending test: (-)       Patrick's Maneuver: (+) for bilateral S-I arthralgia and for bilateral hip  arthralgia (possible) FABER* test: (+) for bilateral S-I arthralgia             S-I anterior distraction/compression test: deferred today         S-I lateral compression test: deferred today         S-I Thigh-thrust test: deferred today         S-I Gaenslen's test: deferred today         *(Flexion, ABduction and External Rotation)  Gait & Posture Assessment  Ambulation: Limited Gait: Antalgic gait (limping) Posture: Difficulty standing up straight, due to pain   Lower Extremity Exam    Side: Right lower extremity  Side: Left lower extremity  Stability: No instability observed          Stability: No instability observed          Skin & Extremity Inspection: Skin color, temperature, and hair growth are WNL. No peripheral edema or cyanosis. No masses, redness, swelling, asymmetry, or associated skin lesions. No contractures.  Skin & Extremity Inspection: Skin color, temperature, and hair growth are WNL. No peripheral edema or cyanosis. No masses, redness, swelling, asymmetry, or associated skin lesions. No contractures.  Functional ROM: Adequate ROM for all joints of the lower extremity SLR WNL  Functional ROM: Adequate ROM for all joints of the lower extremity SLR WNL  Muscle Tone/Strength: Able to Toe-walk & Heel-walk without problems  Muscle Tone/Strength: Able to Toe-walk & Heel-walk without problems  Sensory (Neurological): Unimpaired        Sensory (Neurological): Unimpaired        DTR: Patellar: 2+: normal Achilles: 1+: trace Plantar: deferred today  DTR: Patellar: 3+: brisk Achilles: 1+: trace Plantar: deferred today  Palpation: No palpable anomalies  Palpation: No palpable anomalies   Assessment  Primary Diagnosis & Pertinent Problem List: The primary encounter diagnosis was Chronic pain syndrome. Diagnoses of Pharmacologic therapy, Disorder of skeletal system, Problems influencing health status, Chronic low back pain (1ry area of Pain) (Bilateral) (R>L) w/o sciatica, Chronic  lower extremity pain (2ry area of Pain) (Bilateral) (R>L), Chronic hip pain (Bilateral) (R>L), Chronic upper extremity pain (Bilateral), Cervicalgia (Right), Chronic occipital headache (Right), Cervicogenic headache (Right), Occipital neuralgia (lesser occipital nerve) (Right), Cervical facet syndrome (Right), Cervico-occipital neuralgia (Right), and Chronic sacroiliac joint pain (Bilateral) (R>L) were also pertinent to this visit.  Visit Diagnosis (New problems to examiner): 1. Chronic pain syndrome   2. Pharmacologic therapy   3. Disorder of skeletal system   4. Problems influencing health status   5. Chronic low back pain (1ry area of Pain) (Bilateral) (R>L) w/o sciatica   6. Chronic lower extremity pain (2ry area of Pain) (Bilateral) (R>L)   7. Chronic hip pain (Bilateral) (R>L)   8. Chronic upper extremity pain (Bilateral)   9. Cervicalgia (Right)   10. Chronic occipital headache (Right)   11. Cervicogenic headache (Right)   12. Occipital neuralgia (lesser occipital nerve) (Right)   13. Cervical facet syndrome (Right)   14. Cervico-occipital neuralgia (Right)   15. Chronic sacroiliac joint pain (Bilateral) (R>L)    Plan of Care (Initial workup plan)  Note: Ms. Loney was reminded that as per protocol, today's visit has been an evaluation only. We have not taken over the patient's controlled substance  management.  Problem-specific plan: No problem-specific Assessment & Plan notes found for this encounter.   Lab Orders     Comp. Metabolic Panel (12)     Magnesium     Vitamin B12     Sedimentation rate     25-Hydroxy vitamin D Lcms D2+D3     C-reactive protein  Imaging Orders     DG Cervical Spine With Flex & Extend     DG Lumbar Spine Complete W/Bend     DG HIP UNILAT W OR W/O PELVIS 2-3 VIEWS RIGHT     DG HIP UNILAT W OR W/O PELVIS 2-3 VIEWS LEFT     DG Si Joints Referral Orders  No referral(s) requested today   Procedure Orders    No procedure(s) ordered today    Pharmacotherapy (current): Medications ordered:  No orders of the defined types were placed in this encounter.  Medications administered during this visit: Trenna L. Starke had no medications administered during this visit.   Pharmacological management options:  Opioid Analgesics: The patient was informed that there is no guarantee that she would be a candidate for opioid analgesics. The decision will be made following CDC guidelines. This decision will be based on the results of diagnostic studies, as well as Ms. Aldana's risk profile.   Membrane stabilizer: To be determined at a later time  Muscle relaxant: To be determined at a later time  NSAID: To be determined at a later time  Other analgesic(s): To be determined at a later time   Interventional management options: Ms. Titsworth was informed that there is no guarantee that she would be a candidate for interventional therapies. The decision will be based on the results of diagnostic studies, as well as Ms. Boehning's risk profile.  Procedure(s) under consideration:  Diagnostic bilateral lumbar facet block #1  Possible bilateral lumbar facet RFA  Diagnostic bilateral SI joint block #1  Possible bilateral lumbar facet RFA  Diagnostic bilateral IA hip joint injection  Diagnostic right L4-5 LESI  Diagnostic right occipital nerve block #1  Possible right occipital nerve RFA  Diagnostic C2 + TON nerve block #1  Possible C2 + TON RFA  Diagnostic right cervical facet block  Possible right cervical facet RFA    Provider-requested follow-up: Return for (F2F), (2V), (s/p Tests).  Future Appointments  Date Time Provider [place]  02/07/2020  9:15 AM Milinda Pointer, MD [facility] None  02/21/2020 12:00 PM [facility] None    Note by: Gaspar Cola, MD Date: 01/17/2020; Time: 12:29 PM

## 2020-01-17 ENCOUNTER — Ambulatory Visit (HOSPITAL_BASED_OUTPATIENT_CLINIC_OR_DEPARTMENT_OTHER): Admitting: Pain Medicine

## 2020-01-17 ENCOUNTER — Ambulatory Visit
Admission: RE | Admit: 2020-01-17 | Discharge: 2020-01-17 | Disposition: A | Discharge status: Home / Self Care | Source: Ambulatory Visit | Attending: Pain Medicine | Admitting: Pain Medicine

## 2020-01-17 ENCOUNTER — Other Ambulatory Visit
Admission: RE | Admit: 2020-01-17 | Discharge: 2020-01-17 | Disposition: A | Discharge status: Home / Self Care | Source: Ambulatory Visit | Attending: Pain Medicine | Admitting: Pain Medicine

## 2020-01-17 ENCOUNTER — Other Ambulatory Visit: Payer: Self-pay

## 2020-01-17 VITALS — BP 132/83 | HR 106 | Temp 98.2°F | Resp 18 | Ht 58.5 in | Wt 150.0 lb

## 2020-01-17 DIAGNOSIS — M25552 Pain in left hip: Secondary | ICD-10-CM

## 2020-01-17 DIAGNOSIS — M79601 Pain in right arm: Secondary | ICD-10-CM | POA: Insufficient documentation

## 2020-01-17 DIAGNOSIS — M79605 Pain in left leg: Secondary | ICD-10-CM | POA: Insufficient documentation

## 2020-01-17 DIAGNOSIS — R519 Headache, unspecified: Secondary | ICD-10-CM

## 2020-01-17 DIAGNOSIS — M533 Sacrococcygeal disorders, not elsewhere classified: Secondary | ICD-10-CM | POA: Insufficient documentation

## 2020-01-17 DIAGNOSIS — G894 Chronic pain syndrome: Secondary | ICD-10-CM | POA: Diagnosis not present

## 2020-01-17 DIAGNOSIS — G4486 Cervicogenic headache: Secondary | ICD-10-CM | POA: Insufficient documentation

## 2020-01-17 DIAGNOSIS — M25551 Pain in right hip: Secondary | ICD-10-CM | POA: Insufficient documentation

## 2020-01-17 DIAGNOSIS — G8929 Other chronic pain: Secondary | ICD-10-CM | POA: Insufficient documentation

## 2020-01-17 DIAGNOSIS — M5481 Occipital neuralgia: Secondary | ICD-10-CM

## 2020-01-17 DIAGNOSIS — M545 Low back pain: Secondary | ICD-10-CM | POA: Diagnosis not present

## 2020-01-17 DIAGNOSIS — M542 Cervicalgia: Secondary | ICD-10-CM | POA: Insufficient documentation

## 2020-01-17 DIAGNOSIS — M79604 Pain in right leg: Secondary | ICD-10-CM | POA: Diagnosis not present

## 2020-01-17 DIAGNOSIS — M47812 Spondylosis without myelopathy or radiculopathy, cervical region: Secondary | ICD-10-CM | POA: Diagnosis present

## 2020-01-17 DIAGNOSIS — Z79899 Other long term (current) drug therapy: Secondary | ICD-10-CM | POA: Insufficient documentation

## 2020-01-17 DIAGNOSIS — M79602 Pain in left arm: Secondary | ICD-10-CM

## 2020-01-17 DIAGNOSIS — Z789 Other specified health status: Secondary | ICD-10-CM | POA: Insufficient documentation

## 2020-01-17 DIAGNOSIS — M899 Disorder of bone, unspecified: Secondary | ICD-10-CM

## 2020-01-17 DIAGNOSIS — M47816 Spondylosis without myelopathy or radiculopathy, lumbar region: Secondary | ICD-10-CM | POA: Insufficient documentation

## 2020-01-17 LAB — COMPREHENSIVE METABOLIC PANEL
ALT: 22 U/L (ref 0–44)
AST: 19 U/L (ref 15–41)
Albumin: 4.3 g/dL (ref 3.5–5.0)
Alkaline Phosphatase: 123 U/L (ref 38–126)
Anion gap: 8 (ref 5–15)
BUN: 17 mg/dL (ref 6–20)
CO2: 24 mmol/L (ref 22–32)
Calcium: 9.1 mg/dL (ref 8.9–10.3)
Chloride: 108 mmol/L (ref 98–111)
Creatinine, Ser: 1.1 mg/dL — ABNORMAL HIGH (ref 0.44–1.00)
GFR calc Af Amer: >60 mL/min (ref >60)
GFR calc non Af Amer: 58 mL/min — ABNORMAL LOW (ref >60)
Glucose, Bld: 86 mg/dL (ref 70–99)
Potassium: 4 mmol/L (ref 3.5–5.1)
Sodium: 140 mmol/L (ref 135–145)
Total Bilirubin: 0.5 mg/dL (ref 0.3–1.2)
Total Protein: 7.2 g/dL (ref 6.5–8.1)

## 2020-01-17 LAB — SEDIMENTATION RATE: Sed Rate: 9 mm/hr (ref 0–30)

## 2020-01-17 LAB — MAGNESIUM: Magnesium: 2.2 mg/dL (ref 1.7–2.4)

## 2020-01-17 NOTE — Patient Instructions (Signed)

## 2020-01-19 ENCOUNTER — Other Ambulatory Visit: Payer: Self-pay | Admitting: Internal Medicine

## 2020-01-20 LAB — C-REACTIVE PROTEIN: CRP: 1.4 mg/dL — ABNORMAL HIGH (ref <1.0)

## 2020-01-20 LAB — VITAMIN B12: Vitamin B-12: 575 pg/mL (ref 180–914)

## 2020-01-20 LAB — VITAMIN D 25 HYDROXY (VIT D DEFICIENCY, FRACTURES): Vit D, 25-Hydroxy: 59.14 ng/mL (ref 30–100)

## 2020-02-06 NOTE — Progress Notes (Signed)
PROVIDER NOTE: Information contained herein reflects review and annotations entered in association with encounter. Interpretation of such information and data should be left to medically-trained personnel. Information provided to patient can be located elsewhere in the medical record under "Patient Instructions". Document created using STT-dictation technology, any transcriptional errors that may result from process are unintentional.    Patient: Grace Shepard  Service Category: E/M  Provider: Gaspar Cola, MD  DOB: 25-Feb-1966  DOS: 02/07/2020  Specialty: Interventional Pain Management  MRN: [id number]  Setting: Ambulatory outpatient  PCP: [name], NP  Type: Established Patient    Referring Provider: [name], NP  Location: Office  Delivery: Face-to-face     Primary Reason(s) for Visit: Encounter for evaluation before starting new chronic pain management plan of care (Level of risk: moderate) CC: Leg Pain (bilateral)  HPI  Grace Shepard is a 54 y.o. year old, female patient, who comes today for a follow-up evaluation to review the test results and decide on a treatment plan. She has Fibromyalgia; Chest pain; Migraines; Seasonal allergies; Insomnia; Chronic low back pain (1ry area of Pain) (Bilateral) (R>L) w/o sciatica; GERD (gastroesophageal reflux disease); Numbness; Acquired hypothyroidism; Anxiety and depression; Post-menopause; HLD (hyperlipidemia); Acute bacterial sinusitis; Allergic rhinitis; Chronic cystitis; Female stress incontinence; Renal mass, right; Sleep disorder; Enterocolitis due to Clostridium difficile; Headache disorder; Chronic pain syndrome; Pharmacologic therapy; Disorder of skeletal system; Problems influencing health status; Chronic lower extremity pain (2ry area of Pain) (Bilateral) (R>L); Chronic hip pain (Bilateral) (R>L); Chronic upper extremity pain (Bilateral); Cervicalgia (Right); Chronic occipital headache (Right); Cervicogenic headache (Right); Occipital  neuralgia (lesser occipital nerve) (Right); Cervical facet syndrome (Right); Cervico-occipital neuralgia (Right); Chronic sacroiliac joint pain (Bilateral) (R>L); Lumbar facet syndrome (Bilateral) (R>L); Chronic sacroiliac joint pain (Right); Sacroiliac joint dysfunction (Bilateral) (R>L); Trochanteric bursitis of hip (Bilateral); Elevated C-reactive protein (CRP); Diffuse arthralgia; and Chronic hip pain (Left) on their problem list. Her primarily concern today is the Leg Pain (bilateral)  Pain Assessment: Location: Right, Left, Upper, Lower Leg Onset: More than a month ago Duration: Chronic pain Quality: Aching Severity: 6 /10 (subjective, self-reported pain score)  Note: Reported level is compatible with observation.                         When using our objective Pain Scale, levels between 6 and 10/10 are said to belong in an emergency room, as it progressively worsens from a 6/10, described as severely limiting, requiring emergency care not usually available at an outpatient pain management facility. At a 6/10 level, communication becomes difficult and requires great effort. Assistance to reach the emergency department may be required. Facial flushing and profuse sweating along with potentially dangerous increases in heart rate and blood pressure will be evident. Timing: Intermittent Modifying factors: nothing BP: 122/86  HR: 89  Grace Shepard comes in today for a follow-up visit after her initial evaluation on 01/17/2020. Today we went over the results of her tests. These were explained in "Layman's terms". During today's appointment we went over my diagnostic impression, as well as the proposed treatment plan.  According to the patient, she was originally referred to [place] for the treatment of a right-sided headache that stayed in place for approximately 9 weeks.  The patient indicates that on her own she went ahead and scheduled a second opinion with a headache wellness clinic in [place].  She  has not attended that evaluation yet but today I have recommended that she follow-up  with it so that she can get that second opinion.  According to the patient she initially was prescribed some nortriptyline at bedtime for the headache, which did not help.  The dose was subsequently increased and that also did not help.  Eventually Topamax was added to the regiment at bedtime and this is when the headache finally subsided.  In addition to this the patient indicates taking Lyrica 75 mg p.o. twice daily for a diagnosis of fibromyalgia.  At this point, the headache is gone and therefore is not her primary pain.  The patient indicates that her current primary pain is that of the fibromyalgia.  I went on further to ask her what pain she attributed to fibromyalgia and she indicated that it was the pain that she was experiencing in the lower back, both legs, neck and upper extremities.  She also indicated that her hip pain was also been attributed to the fibromyalgia.  Today I have explained to the patient that fibromyalgia is a diagnosis of exclusion and that we need to first eliminate other possible reasons for the pain before we adjudicate the diagnosis of fibromyalgia to it.  According to the patient her primary pain location is that of the lower back, bilaterally, (R>L).  Her secondary area of pain is that of the lower extremities, bilaterally, (R>L).  She describes the pain in the lower extremities as being intermittent and primarily associated with muscle aches.  Physical exam today was positive for exact reproduction of her low back pain upon performing a hyperextension maneuver of the lumbar spine with further confirmation of the pain and exact reproduction upon attempting hyperextension on rotation maneuvers.  The pain was more intense on the right side when doing this.  This was followed by a Luisa Hart maneuver that proved to be positive bilaterally for pain coming from the sacroiliac joint and pain in  the buttocks area that could have been attributed to the hip joints, but with the right side being worse than the left.  She is currently not experiencing the headache that she originally described but she further describes this headache as being in the right occipital region and covering the distribution of the lesser occipital nerve with pain being radiated towards the right side of the neck and trapezius muscle on the right side.  She also indicated having occasional bilateral upper extremity pain primarily in the upper arm between the elbow and the shoulder that she indicates is completely unrelated to movements of the shoulder.  The patient indicates that she saw the neurologist Warder an MRI of the cervical spine, which is scheduled for this afternoon.  She also indicated that they will be doing nerve conduction test of both of her lower extremities, but this is scheduled for August 1.  They told her that they would be doing a series of 6 trigger point injections for her headache.  At this point she actually indicates that she is not having a whole lot of problems with the headache.  Today I took the time to go over her lab work and x-rays all of which were explained to the patient in layman's terms.  I will also gone over the proposed plan and I have pointed out that she should not have steroid injections any closer than every 2 weeks.  She has elected to postpone the injection from the neurologist in order to have the treatment for her low back and hip pain.  Physical exam today was positive for right sacroiliac joint  pain, left hip pain, and bilateral trochanteric bursitis.  I have offered the patient to inject all of those at one sitting.  She has decided to do that before undergoing the injections for her neck.  In addition to this she has asked me for something that she can take for her arthritis, but when I recommended NSAIDs she indicated that that neurologist told her that she should not be  taking any.  Because of this we opted to do a trial of OTC turmeric.  A prescription was sent to the pharmacy.  In considering the treatment plan options, Grace Shepard was reminded that I no longer take patients for medication management only. I asked her to let me know if she had no intention of taking advantage of the interventional therapies, so that we could make arrangements to provide this space to someone interested. I also made it clear that undergoing interventional therapies for the purpose of getting pain medications is very inappropriate on the part of a patient, and it will not be tolerated in this practice. This type of behavior would suggest true addiction and therefore it requires referral to an addiction specialist.   Further details on both, my assessment(s), as well as the proposed treatment plan, please see below.  Controlled Substance Pharmacotherapy Assessment REMS (Risk Evaluation and Mitigation Strategy)  Analgesic: None Highest recorded MME/day: 0 mg/day MME/day: 0 mg/day  Pill Count: None expected due to no prior prescriptions written by our practice. Landis Martins, RN  02/07/2020  9:37 AM  Sign when Signing Visit Safety precautions to be maintained throughout the outpatient stay will include: orient to surroundings, keep bed in low position, maintain call bell within reach at all times, provide assistance with transfer out of bed and ambulation.    Pharmacokinetics: Liberation and absorption (onset of action): WNL Distribution (time to peak effect): WNL Metabolism and excretion (duration of action): WNL         Pharmacodynamics: Desired effects: Analgesia: Grace Shepard reports >50% benefit. Functional ability: Patient reports that medication allows her to accomplish basic ADLs Clinically meaningful improvement in function (CMIF): Sustained CMIF goals met Perceived effectiveness: Described as relatively effective, allowing for increase in activities of daily living  (ADL) Undesirable effects: Side-effects or Adverse reactions: None reported Monitoring: [place] PMP: PDMP reviewed during this encounter. Online review of the past 21-monthperiod previously conducted. Not applicable at this point since we have not taken over the patient's medication management yet. List of other Serum/Urine Drug Screening Test(s):  No results found. List of all UDS test(s) done:  No results found. Last UDS on record: No results found. UDS interpretation: No unexpected findings.          Medication Assessment Form: Patient introduced to form today Treatment compliance: Treatment may start today if patient agrees with proposed plan. Evaluation of compliance is not applicable at this point Risk Assessment Profile: Aberrant behavior: See initial evaluations. None observed or detected today Comorbid factors increasing risk of overdose: See initial evaluation. No additional risks detected today Opioid risk tool (ORT):  Opioid Risk  01/17/2020  Alcohol 1  Illegal Drugs 0  Rx Drugs 4  Alcohol 0  Illegal Drugs 0  Rx Drugs 0  Age between 16-45 years  0  History of Preadolescent Sexual Abuse 3  Psychological Disease 0  Depression 1  Opioid Risk Tool Scoring 9  Opioid Risk Interpretation High Risk    ORT Scoring interpretation table:  Score <3 = Low Risk for SUD  Score between 4-7 = Moderate Risk for SUD  Score >8 = High Risk for Opioid Abuse   Risk of substance use disorder (SUD): Low  Risk Mitigation Strategies:  Patient opioid safety counseling: Not applicable. Patient-Prescriber Agreement (PPA): No agreement signed.  Controlled substance notification to other providers: Not applicable  Pharmacologic Plan: No opioid analgesic prescribed.             Laboratory Chemistry Profile   Renal Lab Results  Component Value Date   BUN 17 01/17/2020   CREATININE 1.10 (H) 33/54/5625   BCR NOT APPLICABLE 63/89/3734   GFR 80.13 07/12/2019   GFRAA >60 01/17/2020   GFRNONAA  58 (L) 01/17/2020   SPECGRAV 1.015 03/04/2019   PHUR 6.0 03/04/2019   PROTEINUR Negative 03/04/2019     Electrolytes Lab Results  Component Value Date   NA 140 01/17/2020   K 4.0 01/17/2020   CL 108 01/17/2020   CALCIUM 9.1 01/17/2020   MG 2.2 01/17/2020     Hepatic Lab Results  Component Value Date   AST 19 01/17/2020   ALT 22 01/17/2020   ALBUMIN 4.3 01/17/2020   ALKPHOS 123 01/17/2020   LIPASE 24.0 06/15/2015     ID Lab Results  Component Value Date   SARSCOV2NAA NEGATIVE 12/03/2019   PREGTESTUR Negative 06/15/2015     Bone Lab Results  Component Value Date   VD25OH 59.14 01/17/2020     Endocrine Lab Results  Component Value Date   GLUCOSE 86 01/17/2020   GLUCOSEU NEGATIVE 05/28/2015   HGBA1C 5.7 03/04/2019   TSH 0.37 10/28/2019   FREET4 0.88 10/28/2019     Neuropathy Lab Results  Component Value Date   VITAMINB12 575 01/17/2020   HGBA1C 5.7 03/04/2019     CNS No results found.   Inflammation (CRP: Acute  ESR: Chronic) Lab Results  Component Value Date   CRP 1.4 (H) 01/17/2020   ESRSEDRATE 9 01/17/2020     Rheumatology No results found.   Coagulation Lab Results  Component Value Date   PLT 280.0 03/04/2019     Cardiovascular Lab Results  Component Value Date   CKTOTAL 100 02/12/2009   TROPONINI <0.03 06/16/2016   HGB 13.7 03/04/2019   HCT 40.6 03/04/2019     Screening Lab Results  Component Value Date   SARSCOV2NAA NEGATIVE 12/03/2019   PREGTESTUR Negative 06/15/2015     Cancer No results found.   Allergens No results found.     Note: Lab results reviewed.  Recent Diagnostic Imaging Review  Cervical Imaging: DG Cervical Spine With Flex & Extend  Narrative CLINICAL DATA:  Cervicalgia  EXAM: CERVICAL SPINE COMPLETE WITH FLEXION AND EXTENSION VIEWS  COMPARISON:  06/07/2010  FINDINGS: Osseous demineralization.  Prevertebral soft tissues normal thickness.  Disc space narrowing C5-C6 with 2 mm retrolisthesis  slightly increased from previous exam.  Retrolisthesis appears unchanged with extension and resolves with flexion.  Reversal of cervical lordosis question muscle spasm.  Scattered mild facet and uncovertebral degenerative changes.  No fracture, additional subluxation or bone destruction.  Foramina incompletely profiled, but uncovertebral spurs are seen encroaching upon the RIGHT C5-C6 neural foramen.  IMPRESSION: Degenerative disc disease changes of the cervical spine with encroachment upon RIGHT C5-C6 neural foramen by uncovertebral spurs.  2 mm retrolisthesis C5-C6, stable with extension and resolved with flexion.   Electronically Signed By: Lavonia Dana M.D. On: 01/18/2020 08:13  Lumbosacral Imaging: DG Lumbar Spine Complete  Narrative CLINICAL DATA:  Bilateral low back pain without sciatic symptoms,  duration of symptoms several years, no history of trauma.  EXAM: LUMBAR SPINE - COMPLETE 4+ VIEW  COMPARISON:  Abdominal and pelvic CT scan of May 28, 2015  FINDINGS: S1 is transitional. 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 The transverse processes of L1 are transitional as well. The vertebral bodies are preserved in height. The disc space heights are well maintained. There is mild facet joint hypertrophy at L5-S1. The pedicles and transverse processes are intact. The observed portions of the sacrum exhibit no acute abnormalities.  IMPRESSION: There is no acute or significant chronic bony abnormality of the lumbar spine. 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   Electronically Signed By: David  Martinique M.D. On: 06/08/2015 15:18  DG Lumbar Spine Complete W/Bend  Narrative CLINICAL DATA:  Low back pain  EXAM: LUMBAR SPINE - COMPLETE WITH BENDING VIEWS  COMPARISON:  06/08/2015  Correlation: Chest radiographs 06/16/2016  FINDINGS: Eleven pairs of ribs on prior chest radiograph.  Hypoplastic RIGHT twelfth rib.  Five additional  non-rib-bearing lumbar type vertebra.  LEFT assimilation joint L5-S1.  Vertebral body and disc space heights maintained.  No fracture, subluxation, or bone destruction.  Bones demineralized.  SI joints preserved.  IMPRESSION: No acute osseous abnormalities.   Electronically Signed By: Lavonia Dana M.D. On: 01/18/2020 08:16        Sacroiliac Joint Imaging: DG Si Joints  Narrative CLINICAL DATA:  BILATERAL chronic hip and SI joint pain  EXAM: BILATERAL SACROILIAC JOINTS - 3+ VIEW  COMPARISON:  None  FINDINGS: Osseous mineralization normal for technique.  Hip and SI joint spaces preserved.  No fracture or bone destruction.  IMPRESSION: Normal exam.   Electronically Signed By: Lavonia Dana M.D. On: 01/18/2020 08:23  Hip Imaging: DG HIP UNILAT W OR W/O PELVIS 2-3 VIEWS RIGHT  Narrative CLINICAL DATA:  RIGHT hip pain, arthralgia  EXAM: DG HIP (WITH OR WITHOUT PELVIS) 2-3V RIGHT  COMPARISON:  None  FINDINGS: Osseous mineralization normal.  Hip and SI joint spaces preserved.  No fracture, dislocation, or bone destruction.  IMPRESSION: Normal exam.   Electronically Signed By: Lavonia Dana M.D. On: 01/18/2020 08:17  DG HIP UNILAT W OR W/O PELVIS 2-3 VIEWS LEFT  Narrative CLINICAL DATA:  Chronic BILATERAL hip and SI joint pain, fibromyalgia  EXAM: DG HIP (WITH OR WITHOUT PELVIS) 2-3V LEFT  COMPARISON:  None  FINDINGS: Osseous mineralization normal.  Hip and SI joint spaces preserved.  No fracture, dislocation, or bone destruction.  LEFT assimilation joint L5-S1.  IMPRESSION: Normal exam.   Electronically Signed By: Lavonia Dana M.D. On: 01/18/2020 08:18  Complexity Note: Imaging results reviewed. Results shared with Grace Shepard, using Layman's terms.                        Meds   Current Outpatient Medications:  .  albuterol (PROVENTIL HFA;VENTOLIN HFA) 108 (90 Base) MCG/ACT inhaler, Inhale into the lungs., Disp: , Rfl:  .   cetirizine (ZYRTEC) 10 MG tablet, Take 10 mg by mouth daily., Disp: , Rfl:  .  Cholecalciferol (VITAMIN D3) 125 MCG (5000 UT) CAPS, Take by mouth., Disp: , Rfl:  .  citalopram (CELEXA) 40 MG tablet, TAKE 1 TABLET DAILY, Disp: 90 tablet, Rfl: 2 .  levothyroxine (SYNTHROID) 75 MCG tablet, Take 1 tablet (75 mcg total) by mouth daily., Disp: 90 tablet, Rfl: 1 .  montelukast (SINGULAIR) 10 MG tablet, Take 1 tablet (10 mg total) by mouth at  bedtime. SCHEDULE PHYSICAL EXAM FOR MORE REFILLS, Disp: 90 tablet, Rfl: 0 .  NON FORMULARY, at bedtime. Hemp gummies, Disp: , Rfl:  .  pantoprazole (PROTONIX) 40 MG tablet, Take 1 tablet (40 mg total) by mouth daily., Disp: 90 tablet, Rfl: 2 .  pregabalin (LYRICA) 75 MG capsule, TAKE 1 CAPSULE TWICE A DAY, Disp: 180 capsule, Rfl: 1 .  rosuvastatin (CRESTOR) 10 MG tablet, TAKE 1 TABLET DAILY, Disp: 90 tablet, Rfl: 2 .  topiramate (TOPAMAX) 50 MG tablet, 100 mg daily. , Disp: , Rfl:  .  triamcinolone cream (KENALOG) 0.1 %, Apply 1 application topically 2 (two) times daily as needed. Avoid face, groin, axilla, Disp: 30 g, Rfl: 0 .  vitamin B-12 (CYANOCOBALAMIN) 1000 MCG tablet, Take 50 mcg by mouth every other day. , Disp: , Rfl:  .  zolpidem (AMBIEN CR) 6.25 MG CR tablet, TAKE 1 TABLET BY MOUTH AT BEDTIME AS NEEDED FOR SLEEP, Disp: 90 tablet, Rfl: 0 .  nortriptyline (PAMELOR) 10 MG capsule, 50 mg at bedtime. Take as directed (Patient not taking: Reported on 02/07/2020), Disp: , Rfl:  .  Turmeric 500 MG CAPS, Take 500 mg by mouth daily., Disp: 90 capsule, Rfl: 0  ROS  Constitutional: Denies any fever or chills Gastrointestinal: No reported hemesis, hematochezia, vomiting, or acute GI distress Musculoskeletal: Denies any acute onset joint swelling, redness, loss of ROM, or weakness Neurological: No reported episodes of acute onset apraxia, aphasia, dysarthria, agnosia, amnesia, paralysis, loss of coordination, or loss of consciousness  Allergies  Grace Shepard is  allergic to bactrim [sulfamethoxazole-trimethoprim], gabapentin, and tramadol.  [place]  Drug: Grace Shepard  reports no history of drug use. Alcohol:  reports no history of alcohol use. Tobacco:  reports that she has never smoked. She has never used smokeless tobacco. Medical:  has a past medical history of Allergic rhinitis, Arthritis, Depression, Fibromyalgia, Hyperlipidemia, Ischemic colitis ([place]), Migraine, Thyroid disease, and UTI (lower urinary tract infection). Surgical: Grace Shepard  has a past surgical history that includes Tubal ligation. Family: family history includes Alcohol abuse in her paternal uncle and sister; Aneurysm in her mother; Arthritis in her sister; Breast cancer in her maternal aunt; Congestive Heart Failure in her father; Crohn's disease in her sister; Depression in her father, sister, and sister; Diabetes in her brother and father; Drug abuse in her sister; Hearing loss in her father; Heart attack in her maternal grandfather; Heart disease in her sister; Osteoarthritis in her mother; Rheum arthritis in her father; Stroke in her mother.  Constitutional Exam  General appearance: Well nourished, well developed, and well hydrated. In no apparent acute distress Vitals:   02/07/20 0932  BP: 122/86  Pulse: 89  Resp: 16  Temp: (!) 96.8 F (36 C)  TempSrc: Temporal  SpO2: 98%  Weight: 151 lb (68.5 kg)  Height: _0  (1.473 m)   BMI Assessment: Estimated body mass index is 31.56 kg/m as calculated from the following:   Height as of this encounter: _1  (1.473 m).   Weight as of this encounter: 151 lb (68.5 kg).  BMI interpretation table: BMI level Category Range association with higher incidence of chronic pain  <18 kg/m2 Underweight   18.5-24.9 kg/m2 Ideal body weight   25-29.9 kg/m2 Overweight Increased incidence by 20%  30-34.9 kg/m2 Obese (Class I) Increased incidence by 68%  35-39.9 kg/m2 Severe obesity (Class II) Increased incidence by 136%  >40 kg/m2 Extreme  obesity (Class III) Increased incidence by 254%   Patient's current BMI Ideal  Body weight  Body mass index is 31.56 kg/m. Patient must be at least 60 in tall to calculate ideal body weight   BMI Readings from Last 4 Encounters:  02/07/20 31.56 kg/m  01/17/20 30.82 kg/m  12/03/19 30.82 kg/m  03/04/19 31.70 kg/m   Wt Readings from Last 4 Encounters:  02/07/20 151 lb (68.5 kg)  01/17/20 150 lb (68 kg)  12/03/19 150 lb (68 kg)  03/04/19 153 lb (69.4 kg)    Psych/Mental status: Alert, oriented x 3 (person, place, & time)       Eyes: PERLA Respiratory: No evidence of acute respiratory distress  Cervical Spine Exam  Skin & Axial Inspection: No masses, redness, edema, swelling, or associated skin lesions Alignment: Symmetrical Functional ROM: Decreased ROM      Stability: No instability detected Muscle Tone/Strength: Guarding observed Sensory (Neurological): Movement-associated discomfort Palpation: No palpable anomalies              Upper Extremity (UE) Exam    Side: Right upper extremity  Side: Left upper extremity  Skin & Extremity Inspection: Skin color, temperature, and hair growth are WNL. No peripheral edema or cyanosis. No masses, redness, swelling, asymmetry, or associated skin lesions. No contractures.  Skin & Extremity Inspection: Skin color, temperature, and hair growth are WNL. No peripheral edema or cyanosis. No masses, redness, swelling, asymmetry, or associated skin lesions. No contractures.  Functional ROM: Unrestricted ROM          Functional ROM: Unrestricted ROM          Muscle Tone/Strength: Functionally intact. No obvious neuro-muscular anomalies detected.  Muscle Tone/Strength: Functionally intact. No obvious neuro-muscular anomalies detected.  Sensory (Neurological): Unimpaired          Sensory (Neurological): Unimpaired          Palpation: No palpable anomalies              Palpation: No palpable anomalies              Provocative Test(s):  Phalen's test:  deferred Tinel's test: deferred Apley's scratch test (touch opposite shoulder):  Action 1 (Across chest): deferred Action 2 (Overhead): deferred Action 3 (LB reach): deferred   Provocative Test(s):  Phalen's test: deferred Tinel's test: deferred Apley's scratch test (touch opposite shoulder):  Action 1 (Across chest): deferred Action 2 (Overhead): deferred Action 3 (LB reach): deferred    Thoracic Spine Area Exam  Skin & Axial Inspection: No masses, redness, or swelling Alignment: Symmetrical Functional ROM: Unrestricted ROM Stability: No instability detected Muscle Tone/Strength: Functionally intact. No obvious neuro-muscular anomalies detected. Sensory (Neurological): Unimpaired Muscle strength & Tone: No palpable anomalies  Lumbar Exam  Skin & Axial Inspection: No masses, redness, or swelling Alignment: Symmetrical Functional ROM: Decreased ROM       Stability: No instability detected Muscle Tone/Strength: Functionally intact. No obvious neuro-muscular anomalies detected. Sensory (Neurological): Unimpaired Palpation: No palpable anomalies       Provocative Tests: Hyperextension/rotation test: (+) on the left for facet joint pain. Lumbar quadrant test (Kemp's test): deferred today       Lateral bending test: deferred today       Patrick's Maneuver: (+) for right-sided S-I arthralgia and for left hip arthralgia FABER* test: (+) for right-sided S-I arthralgia and for left hip arthralgia S-I anterior distraction/compression test: deferred today         S-I lateral compression test: deferred today         S-I Thigh-thrust test: deferred today  S-I Gaenslen's test: deferred today         *(Flexion, ABduction and External Rotation)  Gait & Posture Assessment  Ambulation: Unassisted Gait: Relatively normal for age and body habitus Posture: WNL   Lower Extremity Exam    Side: Right lower extremity  Side: Left lower extremity  Stability: No instability observed           Stability: No instability observed          Skin & Extremity Inspection: Skin color, temperature, and hair growth are WNL. No peripheral edema or cyanosis. No masses, redness, swelling, asymmetry, or associated skin lesions. No contractures.  Skin & Extremity Inspection: Skin color, temperature, and hair growth are WNL. No peripheral edema or cyanosis. No masses, redness, swelling, asymmetry, or associated skin lesions. No contractures.  Functional ROM: Unrestricted ROM for all joints of the lower extremity Adequate SLR (straight leg raise)  Functional ROM: Unrestricted ROM         Adequate SLR (straight leg raise)  Muscle Tone/Strength: Able to Toe-walk & Heel-walk without problems  Muscle Tone/Strength: Able to Toe-walk & Heel-walk without problems  Sensory (Neurological): Articular pain pattern        Sensory (Neurological): Articular pain pattern        DTR: Patellar: deferred today Achilles: deferred today Plantar: deferred today  DTR: Patellar: deferred today Achilles: deferred today Plantar: deferred today  Palpation: No palpable anomalies  Palpation: No palpable anomalies   Assessment & Plan  Primary Diagnosis & Pertinent Problem List: The primary encounter diagnosis was Chronic low back pain (1ry area of Pain) (Bilateral) (R>L) w/o sciatica. 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  Visit Diagnosis: 1. Chronic low back pain (1ry area of Pain) (Bilateral) (R>L) w/o sciatica   2. Chronic lower extremity pain (2ry area of Pain) (Bilateral) (R>L)   3. Chronic sacroiliac joint pain (Bilateral) (R>L)   4. Chronic sacroiliac joint pain (Right)   5.  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 Trochanteric bursitis of hip (Bilateral)   7. Elevated C-reactive protein (CRP)   8. Diffuse arthralgia   9. 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 Turmeric 500 MG CAPS    Sig: Take 500 mg by mouth daily.    Dispense:  90 capsule    Refill:  0    OTC Recommendation. Please provide information on: where to find; how to take; side-effects; adverse reactions; drug-to-drug interactions; and contraindications. If unavailable, recommend similar substitute.    Procedure Orders     HIP INJECTION     HIP INJECTION     SACROILIAC JOINT INJECTION Lab Orders  No laboratory test(s) ordered today   Imaging Orders  No imaging studies ordered today   Referral Orders  No referral(s) requested today   Pharmacological management options:  Opioid Analgesics: I will not be prescribing any opioids at this time Membrane stabilizer: None prescribed at this time  Muscle relaxant: None prescribed at this time NSAID: Turmeric trial initiated Other analgesic(s): None prescribed at this time    Interventional management options: Planned, scheduled, and/or pending:    Diagnostic right SI joint block #1 + left IA hip injection #1 + bilateral trochanteric bursa injection #1 under fluoroscopic guidance and IV sedation    Considering:   Diagnostic bilateral sacroiliac joint block  Diagnostic bilateral trochanteric bursa injections  Diagnostic bilateral IA hip joint injections  Diagnostic right-sided cervical ESI  Diagnostic right-sided occipital nerve blocks  Possible right occipital nerve RFA   Diagnostic right C2/TON nerve block  Possible right C2/TON RFA    PRN Procedures:   None at this time    Provider-requested follow-up: Return for Procedure (w/ sedation): (R) SI BLK #1 + (L) IA Hip inj #1 + (B) TBI #1, (ASAP). Recent Visits Date Type Provider Dept  01/17/20 Office Visit Milinda Pointer, MD [facility]  Showing recent visits within past 90 days and meeting all other requirements Today's Visits Date Type Provider Dept  02/07/20 Office Visit Milinda Pointer, MD [facility]  Showing today's visits and meeting all other requirements Future Appointments Date Type Provider Dept  02/08/20 Appointment Milinda Pointer, MD [facility]  Showing future appointments within next 90 days and meeting all other requirements  Primary Care Physician: [name], NP Note by: Gaspar Cola, MD Date: 02/07/2020; Time: 10:41 AM

## 2020-02-07 ENCOUNTER — Encounter: Payer: Self-pay | Admitting: Pain Medicine

## 2020-02-07 ENCOUNTER — Ambulatory Visit: Attending: Pain Medicine | Admitting: Pain Medicine

## 2020-02-07 ENCOUNTER — Other Ambulatory Visit: Payer: Self-pay

## 2020-02-07 VITALS — BP 122/86 | HR 89 | Temp 96.8°F | Resp 16 | Ht <= 58 in | Wt 151.0 lb

## 2020-02-07 DIAGNOSIS — M7062 Trochanteric bursitis, left hip: Secondary | ICD-10-CM | POA: Insufficient documentation

## 2020-02-07 DIAGNOSIS — M533 Sacrococcygeal disorders, not elsewhere classified: Secondary | ICD-10-CM

## 2020-02-07 DIAGNOSIS — M2550 Pain in unspecified joint: Secondary | ICD-10-CM

## 2020-02-07 DIAGNOSIS — M79605 Pain in left leg: Secondary | ICD-10-CM | POA: Diagnosis present

## 2020-02-07 DIAGNOSIS — M25552 Pain in left hip: Secondary | ICD-10-CM | POA: Insufficient documentation

## 2020-02-07 DIAGNOSIS — R7982 Elevated C-reactive protein (CRP): Secondary | ICD-10-CM | POA: Diagnosis present

## 2020-02-07 DIAGNOSIS — M545 Low back pain, unspecified: Secondary | ICD-10-CM

## 2020-02-07 DIAGNOSIS — M7061 Trochanteric bursitis, right hip: Secondary | ICD-10-CM | POA: Diagnosis not present

## 2020-02-07 DIAGNOSIS — G8929 Other chronic pain: Secondary | ICD-10-CM

## 2020-02-07 DIAGNOSIS — M79604 Pain in right leg: Secondary | ICD-10-CM | POA: Diagnosis not present

## 2020-02-07 MED ORDER — TURMERIC 500 MG PO CAPS: 500.0000 mg | ORAL_CAPSULE | Freq: Every day | ORAL | 0 refills | Status: DC

## 2020-02-07 NOTE — Patient Instructions (Addendum)
____________________________________________________________________________________________  Preparing for Procedure with Sedation  Procedure appointments are limited to planned procedures:  No Prescription Refills.  No disability issues will be discussed.  No medication changes will be discussed.  Instructions:  Oral Intake: Do not eat or drink anything for at least 8 hours prior to your procedure. (Exception: Blood Pressure Medication. See below.)  Transportation: Unless otherwise stated by your physician, you may drive yourself after the procedure.  Blood Pressure Medicine: Do not forget to take your blood pressure medicine with a sip of water the morning of the procedure. If your Diastolic (lower reading)is above 100 mmHg, elective cases will be cancelled/rescheduled.  Blood thinners: These will need to be stopped for procedures. Notify our staff if you are taking any blood thinners. Depending on which one you take, there will be specific instructions on how and when to stop it.  Diabetics on insulin: Notify the staff so that you can be scheduled 1st case in the morning. If your diabetes requires high dose insulin, take only  of your normal insulin dose the morning of the procedure and notify the staff that you have done so.  Preventing infections: Shower with an antibacterial soap the morning of your procedure.  Build-up your immune system: Take 1000 mg of Vitamin C with every meal (3 times a day) the day prior to your procedure.  Antibiotics: Inform the staff if you have a condition or reason that requires you to take antibiotics before dental procedures.  Pregnancy: If you are pregnant, call and cancel the procedure.  Sickness: If you have a cold, fever, or any active infections, call and cancel the procedure.  Arrival: You must be in the facility at least 30 minutes prior to your scheduled procedure.  Children: Do not bring children with you.  Dress appropriately:  Bring dark clothing that you would not mind if they get stained.  Valuables: Do not bring any jewelry or valuables.  Reasons to call and reschedule or cancel your procedure: (Following these recommendations will minimize the risk of a serious complication.)  Surgeries: Avoid having procedures within 2 weeks of any surgery. (Avoid for 2 weeks before or after any surgery).  Flu Shots: Avoid having procedures within 2 weeks of a flu shots or . (Avoid for 2 weeks before or after immunizations).  Barium: Avoid having a procedure within 7-10 days after having had a radiological study involving the use of radiological contrast. (Myelograms, Barium swallow or enema study).  Heart attacks: Avoid any elective procedures or surgeries for the initial 6 months after a "Myocardial Infarction" (Heart Attack).  Blood thinners: It is imperative that you stop these medications before procedures. Let us know if you if you take any blood thinner.   Infection: Avoid procedures during or within two weeks of an infection (including chest colds or gastrointestinal problems). Symptoms associated with infections include: Localized redness, fever, chills, night sweats or profuse sweating, burning sensation when voiding, cough, congestion, stuffiness, runny nose, sore throat, diarrhea, nausea, vomiting, cold or Flu symptoms, recent or current infections. It is specially important if the infection is over the area that we intend to treat.  Heart and lung problems: Symptoms that may suggest an active cardiopulmonary problem include: cough, chest pain, breathing difficulties or shortness of breath, dizziness, ankle swelling, uncontrolled high or unusually low blood pressure, and/or palpitations. If you are experiencing any of these symptoms, cancel your procedure and contact your primary care physician for an evaluation.  Remember:  Regular Business hours are:  Monday to Thursday 8:00 AM to 4:00 PM  Provider's  Schedule: Milinda Pointer, MD:  Procedure days: Tuesday and Thursday 7:30 AM to 4:00 PM  Gillis Santa, MD:  Procedure days: Monday and Wednesday 7:30 AM to 4:00 PM ____________________________________________________________________________________________  A prescription for Turmeric has been sent to your pharmacy.

## 2020-02-07 NOTE — Progress Notes (Signed)
Safety precautions to be maintained throughout the outpatient stay will include: orient to surroundings, keep bed in low position, maintain call bell within reach at all times, provide assistance with transfer out of bed and ambulation.  

## 2020-02-08 ENCOUNTER — Encounter: Payer: Self-pay | Admitting: Pain Medicine

## 2020-02-08 ENCOUNTER — Ambulatory Visit
Admission: RE | Admit: 2020-02-08 | Discharge: 2020-02-08 | Disposition: A | Discharge status: Home / Self Care | Source: Ambulatory Visit | Attending: Pain Medicine | Admitting: Pain Medicine

## 2020-02-08 ENCOUNTER — Ambulatory Visit (HOSPITAL_BASED_OUTPATIENT_CLINIC_OR_DEPARTMENT_OTHER): Admitting: Pain Medicine

## 2020-02-08 VITALS — BP 112/73 | HR 78 | Temp 97.3°F | Resp 16 | Ht <= 58 in | Wt 151.0 lb

## 2020-02-08 DIAGNOSIS — Z9851 Tubal ligation status: Secondary | ICD-10-CM | POA: Insufficient documentation

## 2020-02-08 DIAGNOSIS — M76892 Other specified enthesopathies of left lower limb, excluding foot: Secondary | ICD-10-CM

## 2020-02-08 DIAGNOSIS — G8929 Other chronic pain: Secondary | ICD-10-CM | POA: Diagnosis not present

## 2020-02-08 DIAGNOSIS — Z888 Allergy status to other drugs, medicaments and biological substances status: Secondary | ICD-10-CM | POA: Insufficient documentation

## 2020-02-08 DIAGNOSIS — M25552 Pain in left hip: Secondary | ICD-10-CM | POA: Diagnosis not present

## 2020-02-08 DIAGNOSIS — M533 Sacrococcygeal disorders, not elsewhere classified: Secondary | ICD-10-CM

## 2020-02-08 DIAGNOSIS — Z881 Allergy status to other antibiotic agents status: Secondary | ICD-10-CM | POA: Insufficient documentation

## 2020-02-08 DIAGNOSIS — M47898 Other spondylosis, sacral and sacrococcygeal region: Secondary | ICD-10-CM | POA: Diagnosis not present

## 2020-02-08 DIAGNOSIS — M7061 Trochanteric bursitis, right hip: Secondary | ICD-10-CM

## 2020-02-08 DIAGNOSIS — M7062 Trochanteric bursitis, left hip: Secondary | ICD-10-CM | POA: Diagnosis not present

## 2020-02-08 MED ORDER — IOHEXOL 180 MG/ML  SOLN
10.0000 mL | Freq: Once | INTRAMUSCULAR | Status: AC
  Administered 2020-02-08: 10 mL via EPIDURAL
  Filled 2020-02-08: qty 20

## 2020-02-08 MED ORDER — FENTANYL CITRATE (PF) 100 MCG/2ML IJ SOLN
25.0000 ug | INTRAMUSCULAR | Status: DC | PRN
  Filled 2020-02-08: qty 2

## 2020-02-08 MED ORDER — MIDAZOLAM HCL 5 MG/5ML IJ SOLN
1.0000 mg | INTRAMUSCULAR | Status: DC | PRN
  Filled 2020-02-08: qty 5

## 2020-02-08 MED ORDER — LACTATED RINGERS IV SOLN: 1000.0000 mL | Freq: Once | INTRAVENOUS | Status: DC

## 2020-02-08 MED ORDER — ROPIVACAINE HCL 2 MG/ML IJ SOLN
4.0000 mL | Freq: Once | INTRAMUSCULAR | Status: AC
  Administered 2020-02-08: 4 mL via INTRA_ARTICULAR
  Filled 2020-02-08: qty 10

## 2020-02-08 MED ORDER — LIDOCAINE HCL 2 % IJ SOLN
20.0000 mL | Freq: Once | INTRAMUSCULAR | Status: AC
  Administered 2020-02-08: 200 mg
  Filled 2020-02-08: qty 10

## 2020-02-08 MED ORDER — ROPIVACAINE HCL 2 MG/ML IJ SOLN
9.0000 mL | Freq: Once | INTRAMUSCULAR | Status: AC
  Administered 2020-02-08: 10 mL via INTRA_ARTICULAR
  Filled 2020-02-08: qty 10

## 2020-02-08 MED ORDER — METHYLPREDNISOLONE ACETATE 40 MG/ML IJ SUSP
40.0000 mg | Freq: Once | INTRAMUSCULAR | Status: AC
  Administered 2020-02-08: 40 mg via INTRA_ARTICULAR
  Filled 2020-02-08: qty 1

## 2020-02-08 MED ORDER — ROPIVACAINE HCL 2 MG/ML IJ SOLN
4.0000 mL | Freq: Once | INTRAMUSCULAR | Status: AC
  Administered 2020-02-08: 10 mL via INTRA_ARTICULAR
  Filled 2020-02-08: qty 10

## 2020-02-08 MED ORDER — METHYLPREDNISOLONE ACETATE 80 MG/ML IJ SUSP
80.0000 mg | Freq: Once | INTRAMUSCULAR | Status: AC
  Administered 2020-02-08: 80 mg via INTRA_ARTICULAR
  Filled 2020-02-08: qty 1

## 2020-02-08 NOTE — Progress Notes (Signed)
Safety precautions to be maintained throughout the outpatient stay will include: orient to surroundings, keep bed in low position, maintain call bell within reach at all times, provide assistance with transfer out of bed and ambulation.  

## 2020-02-08 NOTE — Patient Instructions (Signed)

## 2020-02-08 NOTE — Progress Notes (Signed)
PROVIDER NOTE: Information contained herein reflects review and annotations entered in association with encounter. Interpretation of such information and data should be left to medically-trained personnel. Information provided to patient can be located elsewhere in the medical record under "Patient Instructions". Document created using STT-dictation technology, any transcriptional errors that may result from process are unintentional.    Patient: Grace Shepard  Service Category: Procedure  Provider: Gaspar Cola, MD  DOB: Nov 21, 1965  DOS: 02/08/2020  Location: [facility]  MRN: [id number]  Setting: Ambulatory - outpatient  Referring Provider: [name], NP  Type: Established Patient  Specialty: Interventional Pain Management  PCP: [name], NP   Primary Reason for Visit: Interventional Pain Management Treatment. CC: Hip Pain (bilateral)  Procedure #1:  Anesthesia, Analgesia, Anxiolysis:  Type: Diagnostic Sacroiliac Joint Steroid Injection #1  Region: Superior Lumbosacral Region Level: PSIS (Posterior Superior Iliac Spine) Laterality: Right  Type: Local Anesthesia Indication(s): Analgesia         Route: Infiltration (Boone/IM) IV Access: Declined Sedation: Declined  Local Anesthetic: Lidocaine 1-2%  Position: Prone           Indications: 1. Chronic sacroiliac joint pain (Right)   2. Other spondylosis, sacral and sacrococcygeal region   3. 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 Level: Lower pelvic and hip joint level. Target Area: Superior aspect of the hip joint cavity, going thru the superior portion of the capsular ligament. Approach: Posterolateral approach. Laterality: Left     Indications: 1. Chronic hip pain (Left)   2. Enthesopathy of hip region (Left)    Procedure #3:    Type: Trochanteric Bursa  Injection #1  Primary Purpose: Diagnostic Region: Upper (proximal) Femoral Region Level: Hip Joint Target Area: Superior aspect of the hip joint cavity, going thru the superior portion of the capsular ligament. Approach: Posterolateral approach Laterality: Bilateral     Indications: 1. Trochanteric bursitis of hip (Bilateral)   2. Greater trochanteric bursitis of hips (Bilateral)   3. Chronic hip pain (Left)   4. Enthesopathy of hip region (Left)    Pain Score: Pre-procedure: 2 /10 Post-procedure: 0-No pain/10   Pre-op Assessment:  Grace Shepard is a 54 y.o. (year old), female patient, seen today for interventional treatment. She  has a past surgical history that includes Tubal ligation. Grace Shepard has a current medication list which includes the following prescription(s): albuterol, cetirizine, vitamin d3, citalopram, levothyroxine, montelukast, NON FORMULARY, pantoprazole, pregabalin, rosuvastatin, topiramate, triamcinolone cream, turmeric, vitamin b-12, zolpidem, and nortriptyline. Her primarily concern today is the Hip Pain (bilateral)  Initial Vital Signs:  Pulse/HCG Rate: 78ECG Heart Rate: 86 Temp: (!) 97.3 F (36.3 C) Resp: 18 BP: 129/88 SpO2: 99 %  BMI: Estimated body mass index is 31.56 kg/m as calculated from the following:   Height as of this encounter: 4\' 10"  (1.473 m).   Weight as of this encounter: 151 lb (68.5 kg).  Risk Assessment: Allergies: Reviewed. She is allergic to bactrim [sulfamethoxazole-trimethoprim], gabapentin, and tramadol.  Allergy Precautions: None required Coagulopathies: Reviewed. None identified.  Blood-thinner therapy: None at this time Active Infection(s): Reviewed. None identified. Grace Shepard is afebrile  Site Confirmation: Grace Shepard was asked to confirm the procedure and laterality before marking the site Procedure checklist: Completed Consent: Before the procedure and under the influence of no sedative(s), amnesic(s), or anxiolytics, the  patient was informed of the treatment  options, risks and possible complications. To fulfill our ethical and legal obligations, as recommended by the American Medical Association's Code of Ethics, I have informed the patient of my clinical impression; the nature and purpose of the treatment or procedure; the risks, benefits, and possible complications of the intervention; the alternatives, including doing nothing; the risk(s) and benefit(s) of the alternative treatment(s) or procedure(s); and the risk(s) and benefit(s) of doing nothing. The patient was provided information about the general risks and possible complications associated with the procedure. These may include, but are not limited to: failure to achieve desired goals, infection, bleeding, organ or nerve damage, allergic reactions, paralysis, and death. In addition, the patient was informed of those risks and complications associated to the procedure, such as failure to decrease pain; infection; bleeding; organ or nerve damage with subsequent damage to sensory, motor, and/or autonomic systems, resulting in permanent pain, numbness, and/or weakness of one or several areas of the body; allergic reactions; (i.e.: anaphylactic reaction); and/or death. Furthermore, the patient was informed of those risks and complications associated with the medications. These include, but are not limited to: allergic reactions (i.e.: anaphylactic or anaphylactoid reaction(s)); adrenal axis suppression; blood sugar elevation that in diabetics may result in ketoacidosis or comma; water retention that in patients with history of congestive heart failure may result in shortness of breath, pulmonary edema, and decompensation with resultant heart failure; weight gain; swelling or edema; medication-induced neural toxicity; particulate matter embolism and blood vessel occlusion with resultant organ, and/or nervous system infarction; and/or aseptic necrosis of one or more  joints. Finally, the patient was informed that Medicine is not an exact science; therefore, there is also the possibility of unforeseen or unpredictable risks and/or possible complications that may result in a catastrophic outcome. The patient indicated having understood very clearly. We have given the patient no guarantees and we have made no promises. Enough time was given to the patient to ask questions, all of which were answered to the patient's satisfaction. Ms. Bihl has indicated that she wanted to continue with the procedure. Attestation: I, the ordering provider, attest that I have discussed with the patient the benefits, risks, side-effects, alternatives, likelihood of achieving goals, and potential problems during recovery for the procedure that I have provided informed consent. Date  Time: 02/08/2020  7:52 AM  Pre-Procedure Preparation:  Monitoring: As per clinic protocol. Respiration, ETCO2, SpO2, BP, heart rate and rhythm monitor placed and checked for adequate function Safety Precautions: Patient was assessed for positional comfort and pressure points before starting the procedure. Time-out: I initiated and conducted the "Time-out" before starting the procedure, as per protocol. The patient was asked to participate by confirming the accuracy of the "Time Out" information. Verification of the correct person, site, and procedure were performed and confirmed by me, the nursing staff, and the patient. "Time-out" conducted as per Joint Commission's Universal Protocol (UP.01.01.01). Time: 0826  Description of Procedure #1:  Target Area: Superior, posterior, aspect of the sacroiliac fissure Approach: Posterior, paraspinal, ipsilateral approach. Area Prepped: Entire Lower Lumbosacral Region DuraPrep (Iodine Povacrylex [0.7% available iodine] and Isopropyl Alcohol, 74% w/w) Safety Precautions: Aspiration looking for blood return was conducted prior to all injections. At no point did we inject  any substances, as a needle was being advanced. No attempts were made at seeking any paresthesias. Safe injection practices and needle disposal techniques used. Medications properly checked for expiration dates. SDV (single dose vial) medications used. Description of the Procedure: Protocol guidelines were followed. The patient was placed  in position over the procedure table. The target area was identified and the area prepped in the usual manner. Skin & deeper tissues infiltrated with local anesthetic. Appropriate amount of time allowed to pass for local anesthetics to take effect. The procedure needle was advanced under fluoroscopic guidance into the sacroiliac joint until a firm endpoint was obtained. Proper needle placement secured. Negative aspiration confirmed. Solution injected in intermittent fashion, asking for systemic symptoms every 0.5cc of injectate. The needles were then removed and the area cleansed, making sure to leave some of the prepping solution back to take advantage of its long term bactericidal properties.  Vitals:   02/08/20 0749  BP: 129/88  Pulse: 78  Resp: 18  Temp: (!) 97.3 F (36.3 C)  SpO2: 99%  Weight: 151 lb (68.5 kg)  Height: 4\' 10"  (1.473 m)     Start Time: 0827 hrs.  Materials:  Needle(s) Type: Spinal Needle Gauge: 22G Length: 3.5-in Medication(s): Please see orders for medications and dosing details.  Imaging Guidance (Non-Spinal) for procedure #1:  Type of Imaging Technique: Fluoroscopy Guidance (Non-Spinal) Indication(s): Assistance in needle guidance and placement for procedures requiring needle placement in or near specific anatomical locations not easily accessible without such assistance. Exposure Time: Please see nurses notes. Contrast: Before injecting any contrast, we confirmed that the patient did not have an allergy to iodine, shellfish, or radiological contrast. Once satisfactory needle placement was completed at the desired level,  radiological contrast was injected. Contrast injected under live fluoroscopy. No contrast complications. See chart for type and volume of contrast used. Fluoroscopic Guidance: I was personally present during the use of fluoroscopy. "Tunnel Vision Technique" used to obtain the best possible view of the target area. Parallax error corrected before commencing the procedure. "Direction-depth-direction" technique used to introduce the needle under continuous pulsed fluoroscopy. Once target was reached, antero-posterior, oblique, and lateral fluoroscopic projection used confirm needle placement in all planes. Images permanently stored in EMR. Interpretation: I personally interpreted the imaging intraoperatively. Adequate needle placement confirmed in multiple planes. Appropriate spread of contrast into desired area was observed. No evidence of afferent or efferent intravascular uptake. Permanent images saved into the patient's record.  Description of Procedure #2:  Safety Precautions: Aspiration looking for blood return was conducted prior to all injections. At no point did we inject any substances, as a needle was being advanced. No attempts were made at seeking any paresthesias. Safe injection practices and needle disposal techniques used. Medications properly checked for expiration dates. SDV (single dose vial) medications used. Description of the Procedure: Protocol guidelines were followed. The patient was placed in position over the fluoroscopy table. The target area was identified and the area prepped in the usual manner. Skin & deeper tissues infiltrated with local anesthetic. Appropriate amount of time allowed to pass for local anesthetics to take effect. The procedure needles were then advanced to the target area. Proper needle placement secured. Negative aspiration confirmed. Solution injected in intermittent fashion, asking for systemic symptoms every 0.5cc of injectate. The needles were then removed and  the area cleansed, making sure to leave some of the prepping solution back to take advantage of its long term bactericidal properties.  Materials:  Needle(s) Type: Spinal Needle Gauge: 22G Length: 5.0-in Medication(s): Please see orders for medications and dosing details.  Imaging Guidance (Non-Spinal) for procedure #2:  Type of Imaging Technique: Fluoroscopy Guidance (Non-Spinal) Indication(s): Assistance in needle guidance and placement for procedures requiring needle placement in or near specific anatomical locations not easily accessible  without such assistance. Exposure Time: Please see nurses notes. Contrast: Before injecting any contrast, we confirmed that the patient did not have an allergy to iodine, shellfish, or radiological contrast. Once satisfactory needle placement was completed at the desired level, radiological contrast was injected. Contrast injected under live fluoroscopy. No contrast complications. See chart for type and volume of contrast used. Fluoroscopic Guidance: I was personally present during the use of fluoroscopy. "Tunnel Vision Technique" used to obtain the best possible view of the target area. Parallax error corrected before commencing the procedure. "Direction-depth-direction" technique used to introduce the needle under continuous pulsed fluoroscopy. Once target was reached, antero-posterior, oblique, and lateral fluoroscopic projection used confirm needle placement in all planes. Images permanently stored in EMR. Interpretation: I personally interpreted the imaging intraoperatively. Adequate needle placement confirmed in multiple planes. Appropriate spread of contrast into desired area was observed. No evidence of afferent or efferent intravascular uptake. Permanent images saved into the patient's record.  Description of Procedure #3:  Area Prepped: Entire Posterolateral hip area. DuraPrep (Iodine Povacrylex [0.7% available iodine] and Isopropyl Alcohol, 74%  w/w) Safety Precautions: Aspiration looking for blood return was conducted prior to all injections. At no point did we inject any substances, as a needle was being advanced. No attempts were made at seeking any paresthesias. Safe injection practices and needle disposal techniques used. Medications properly checked for expiration dates. SDV (single dose vial) medications used. Description of the Procedure: Protocol guidelines were followed. The patient was placed in position over the procedure table. The target area was identified and the area prepped in the usual manner. Skin & deeper tissues infiltrated with local anesthetic. Appropriate amount of time allowed to pass for local anesthetics to take effect. The procedure needles were then advanced to the target area. Proper needle placement secured. Negative aspiration confirmed. Solution injected in intermittent fashion, asking for systemic symptoms every 0.5cc of injectate. The needles were then removed and the area cleansed, making sure to leave some of the prepping solution back to take advantage of its long term bactericidal properties.  Vitals:   02/08/20 0828 02/08/20 0832 02/08/20 0837 02/08/20 0841  BP: (!) 118/91 132/82 (!) 129/93 112/73  Pulse:      Resp: 14 13 10 16   Temp:      SpO2: 99% 100% 100% 100%  Weight:      Height:         End Time: 0840 hrs.       Materials:  Needle(s) Type: Spinal Needle Gauge: 22G Length: 5.0-in Medication(s): Please see orders for medications and dosing details.  Imaging Guidance (Non-Spinal) for procedure #3:  Type of Imaging Technique: Fluoroscopy Guidance (Non-Spinal) Indication(s): Assistance in needle guidance and placement for procedures requiring needle placement in or near specific anatomical locations not easily accessible without such assistance. Exposure Time: Please see nurses notes. Contrast: Before injecting any contrast, we confirmed that the patient did not have an allergy to  iodine, shellfish, or radiological contrast. Once satisfactory needle placement was completed at the desired level, radiological contrast was injected. Contrast injected under live fluoroscopy. No contrast complications. See chart for type and volume of contrast used. Fluoroscopic Guidance: I was personally present during the use of fluoroscopy. "Tunnel Vision Technique" used to obtain the best possible view of the target area. Parallax error corrected before commencing the procedure. "Direction-depth-direction" technique used to introduce the needle under continuous pulsed fluoroscopy. Once target was reached, antero-posterior, oblique, and lateral fluoroscopic projection used confirm needle placement in all planes.  Images permanently stored in EMR. Interpretation: I personally interpreted the imaging intraoperatively. Adequate needle placement confirmed in multiple planes. Appropriate spread of contrast into desired area was observed. No evidence of afferent or efferent intravascular uptake. Permanent images saved into the patient's record.  Antibiotic Prophylaxis:   Anti-infectives (From admission, onward)   None     Indication(s): None identified  Post-operative Assessment:  Post-procedure Vital Signs:  Pulse/HCG Rate: 7872 Temp: (!) 97.3 F (36.3 C) Resp: 16 BP: 112/73 SpO2: 100 %  EBL: None  Complications: No immediate post-treatment complications observed by team, or reported by patient.  Note: The patient tolerated the entire procedure well. A repeat set of vitals were taken after the procedure and the patient was kept under observation following institutional policy, for this type of procedure. Post-procedural neurological assessment was performed, showing return to baseline, prior to discharge. The patient was provided with post-procedure discharge instructions, including a section on how to identify potential problems. Should any problems arise concerning this procedure, the  patient was given instructions to immediately contact us, at any time, without hesitation. In any case, we plan to contact the patient by telephone for a follow-up status report regarding this interventional procedure.  Comments:  No additional relevant information.  Plan of Care  Orders:  Orders Placed This Encounter  Procedures  . SACROILIAC JOINT INJECTION    Scheduling Instructions:     Side: Right-sided     Sedation: With Sedation.     Timeframe: Today    Order Specific Question:   Where will this procedure be performed?    Answer:   [facility]  . HIP INJECTION    Scheduling Instructions:     Side: Left-sided     Sedation: With Sedation.     Timeframe: Today  . 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    Scheduling Instructions:     Procedure: Trochanteric bursa injection     Laterality: Bilateral     Sedation: With Sedation.     Timeframe: Today  . [facility] C-ARM 1-60 MIN NO REPORT    Intraoperative interpretation by procedural physician at [place].    Standing Status:   Standing    Number of Occurrences:   1    Order Specific Question:   Reason for exam:    Answer:   Assistance in needle guidance and placement for procedures requiring needle placement in or near specific anatomical locations not easily accessible without such assistance.  . Informed Consent Details: Physician/Practitioner Attestation; Transcribe to consent form and obtain patient signature    Provider Attestation: I, [place] Dossie Arbour, MD, (Pain Management Specialist), the physician/practitioner, attest that I have discussed with the patient the benefits, risks, side effects, alternatives, likelihood of achieving goals and potential problems during recovery for the procedure that I have provided informed consent.    Scheduling Instructions:     Procedure: Sacroiliac Joint Block      Indication/Reason: Chronic Low Back and Hip Pain secondary to Sacroiliac Joint Pain (Arthralgia/Arthropathy)     Nursing Order: Transcribe to consent form and obtain patient signature.     Note: Always confirm laterality of pain with Ms. Earleen Newport, before procedure.  . Provide equipment / supplies at bedside    Equipment required: Single use, disposable, "Block Tray"    Standing Status:   Standing    Number of Occurrences:   1    Order Specific Question:  Specify    Answer:   Block Tray  . Informed Consent Details: Physician/Practitioner Attestation; Transcribe to consent form and obtain patient signature    Nursing Order: Transcribe to consent form and obtain patient signature. Note: Always confirm laterality of pain with Ms. Earleen Newport, before procedure. Procedure: Hip injection Indication/Reason: Hip Joint Pain (Arthralgia) Provider Attestation: I, [place]ning Dossie Arbour, MD, (Pain Management Specialist), the physician/practitioner, attest that I have discussed with the patient the benefits, risks, side effects, alternatives, likelihood of achieving goals and potential problems during recovery for the procedure that I have provided informed consent.  . Informed Consent Details: Physician/Practitioner Attestation; Transcribe to consent form and obtain patient signature    Provider Attestation: I, [place] Dossie Arbour, MD, (Pain Management Specialist), the physician/practitioner, attest that I have discussed with the patient the benefits, risks, side effects, alternatives, likelihood of achieving goals and potential problems during recovery for the procedure that I have provided informed consent.    Scheduling Instructions:     Procedure: Diagnostic bilateral trochanteric bursa injection     Indication/Reason: Bilateral trochanteric bursitis.  Bilateral hip pain.     Note: Always confirm laterality of pain with Ms. Earleen Newport, before procedure.     Transcribe to consent form and obtain patient signature.    Chronic Opioid Analgesic:  None Highest recorded MME/day: 0 mg/day MME/day: 0 mg/day   Medications ordered for procedure: Meds ordered this encounter  Medications  . iohexol (OMNIPAQUE) 180 MG/ML injection 10 mL    Must be Myelogram-compatible. If not available, you may substitute with a water-soluble, non-ionic, hypoallergenic, myelogram-compatible radiological contrast medium.  Marland Kitchen lidocaine (XYLOCAINE) 2 % (with pres) injection 400 mg  . DISCONTD: lactated ringers infusion 1,000 mL  . DISCONTD: midazolam (VERSED) 5 MG/5ML injection 1-2 mg    Make sure Flumazenil is available in the pyxis when using this medication. If oversedation occurs, administer 0.2 mg IV over 15 sec. If after 45 sec no response, administer 0.2 mg again over 1 min; may repeat at 1 min intervals; not to exceed 4 doses (1 mg)  . DISCONTD: fentaNYL (SUBLIMAZE) injection 25-50 mcg    Make sure Narcan is available in the pyxis when using this medication. In the event of respiratory depression (RR< 8/min): Titrate NARCAN (naloxone) in increments of 0.1 to 0.2 mg IV at 2-3 minute intervals, until desired degree of reversal.  . methylPREDNISolone acetate (DEPO-MEDROL) injection 40 mg  . ropivacaine (PF) 2 mg/mL (0.2%) (NAROPIN) injection 4 mL  . methylPREDNISolone acetate (DEPO-MEDROL) injection 40 mg  . ropivacaine (PF) 2 mg/mL (0.2%) (NAROPIN) injection 4 mL  . methylPREDNISolone acetate (DEPO-MEDROL) injection 80 mg  . ropivacaine (PF) 2 mg/mL (0.2%) (NAROPIN) injection 9 mL   Medications administered: We administered iohexol, lidocaine, methylPREDNISolone acetate, ropivacaine (PF) 2 mg/mL (0.2%), methylPREDNISolone acetate, ropivacaine (PF) 2 mg/mL (0.2%), methylPREDNISolone acetate, and ropivacaine (PF) 2 mg/mL (0.2%).  See the medical record for exact dosing, route, and time of administration.  Follow-up plan:   Return in about 2 weeks (around 02/22/2020) for (F2F), (PP).       Interventional management  options: Planned, scheduled, and/or pending:    Diagnostic right SI joint block #1 + left IA hip injection #1 + bilateral trochanteric bursa injection #1 under fluoroscopic guidance and IV sedation (Today)   Considering:   Diagnostic bilateral sacroiliac joint block  Diagnostic bilateral trochanteric bursa injections  Diagnostic bilateral IA hip joint injections  Diagnostic right-sided cervical ESI  Diagnostic right-sided occipital nerve blocks  Possible right occipital  nerve RFA  Diagnostic right C2/TON nerve block  Possible right C2/TON RFA    PRN Procedures:   None at this time    Recent Visits Date Type Provider Dept  02/07/20 Office Visit Milinda Pointer, MD [facility]  01/17/20 Office Visit Milinda Pointer, MD [facility]  Showing recent visits within past 90 days and meeting all other requirements Today's Visits Date Type Provider Dept  02/08/20 Procedure visit Milinda Pointer, MD [facility]  Showing today's visits and meeting all other requirements Future Appointments Date Type Provider Dept  02/22/20 Appointment Milinda Pointer, MD [facility]  Showing future appointments within next 90 days and meeting all other requirements  Disposition: Discharge home  Discharge (Date  Time): 02/08/2020; 0850 hrs.   Primary Care Physician: [name], NP Location: [facility] Note by: Gaspar Cola, MD Date: 02/08/2020; Time: 9:12 AM  Disclaimer:  Medicine is not an Chief Strategy Officer. The only guarantee in medicine is that nothing is guaranteed. It is important to note that the decision to proceed with this intervention was based on the information collected from the patient. The Data and conclusions were drawn from the patient's questionnaire, the interview, and the physical examination. Because the information was provided in large part by the patient, it cannot be guaranteed that it has not  been purposely or unconsciously manipulated. Every effort has been made to obtain as much relevant data as possible for this evaluation. It is important to note that the conclusions that lead to this procedure are derived in large part from the available data. Always take into account that the treatment will also be dependent on availability of resources and existing treatment guidelines, considered by other Pain Management Practitioners as being common knowledge and practice, at the time of the intervention. For Medico-Legal purposes, it is also important to point out that variation in procedural techniques and pharmacological choices are the acceptable norm. The indications, contraindications, technique, and results of the above procedure should only be interpreted and judged by a Board-Certified Interventional Pain Specialist with extensive familiarity and expertise in the same exact procedure and technique.

## 2020-02-09 ENCOUNTER — Other Ambulatory Visit: Payer: Self-pay | Admitting: Internal Medicine

## 2020-02-09 ENCOUNTER — Telehealth: Payer: Self-pay

## 2020-02-09 NOTE — Telephone Encounter (Signed)
Post procedure phone call. Patient states she is doing good.  

## 2020-02-10 NOTE — Telephone Encounter (Signed)
Last filled 11/15/2019... please advise  

## 2020-02-16 ENCOUNTER — Telehealth: Payer: Self-pay | Admitting: Pain Medicine

## 2020-02-16 NOTE — Telephone Encounter (Addendum)
Patient called today asking about physical therapy. I have looked in her orders and physician visit notes. I do not see any orders for PT. She wants to start asap. Please check with Dr. Dossie Arbour and if he wants her to have PT please place order so we can get this done. Thank you

## 2020-02-17 ENCOUNTER — Other Ambulatory Visit: Payer: Self-pay

## 2020-02-17 DIAGNOSIS — R21 Rash and other nonspecific skin eruption: Secondary | ICD-10-CM

## 2020-02-17 MED ORDER — TRIAMCINOLONE ACETONIDE 0.1 % EX CREA: 1.0000 "application " | TOPICAL_CREAM | Freq: Two times a day (BID) | CUTANEOUS | 0 refills | Status: DC | PRN

## 2020-02-17 NOTE — Progress Notes (Signed)
Refill request for Triamcinolone approved.

## 2020-02-17 NOTE — Telephone Encounter (Signed)
Do you wish to order PT for this patient?

## 2020-02-19 ENCOUNTER — Other Ambulatory Visit: Payer: Self-pay | Admitting: Internal Medicine

## 2020-02-19 DIAGNOSIS — E7800 Pure hypercholesterolemia, unspecified: Secondary | ICD-10-CM

## 2020-02-21 ENCOUNTER — Ambulatory Visit

## 2020-02-21 NOTE — Progress Notes (Signed)
PROVIDER NOTE: Information contained herein reflects review and annotations entered in association with encounter. Interpretation of such information and data should be left to medically-trained personnel. Information provided to patient can be located elsewhere in the medical record under "Patient Instructions". Document created using STT-dictation technology, any transcriptional errors that may result from process are unintentional.    Patient: Grace Shepard  Service Category: E/M  Provider: Gaspar Cola, MD  DOB: Nov 12, 1965  DOS: 02/22/2020  Specialty: Interventional Pain Management  MRN: [id number]  Setting: Ambulatory outpatient  PCP: [name], NP  Type: Established Patient    Referring Provider: [name], NP  Location: Office  Delivery: Face-to-face     HPI  Reason for encounter: [name], a 54 y.o. year old female, is here today for evaluation and management of her Chronic pain syndrome [G89.4]. Grace Shepard primary complain today is Hip Pain (bilateral) and Back Pain (lower) Last encounter: Practice (02/16/2020). My last encounter with her was on 02/16/2020. Pertinent problems: Grace Shepard has Fibromyalgia; Migraines; Chronic low back pain (1ry area of Pain) (Bilateral) (R>L) w/o sciatica; Numbness; Headache disorder; Chronic pain syndrome; Chronic lower extremity pain (2ry area of Pain) (Bilateral) (R>L); Chronic hip pain (Bilateral) (R>L); Chronic upper extremity pain (Bilateral); Cervicalgia (Right); Chronic occipital headache (Right); Cervicogenic headache (Right); Occipital neuralgia (lesser occipital nerve) (Right); Cervical facet syndrome (Right); Cervico-occipital neuralgia (Right); Chronic sacroiliac joint pain (Bilateral) (R>L); Lumbar facet syndrome (Bilateral) (R>L); Chronic sacroiliac joint pain (Right); Sacroiliac joint dysfunction (Bilateral) (R>L); Trochanteric bursitis of hip (Bilateral); Diffuse arthralgia; Chronic hip pain (Left); Other spondylosis,  sacral and sacrococcygeal region; Enthesopathy of hip region (Left); and Greater trochanteric bursitis of hips (Bilateral) on their pertinent problem list. Pain Assessment: Severity of Chronic pain is reported as a 0-No pain/10. Location: Back Lower/denies. Onset: More than a month ago. Quality: Aching, Dull, Sharp. Timing: Intermittent. Modifying factor(s): stretching, heat. Vitals:  height is 4' 10"  (1.473 m) and weight is 150 lb (68 kg). Her temporal temperature is 97.4 F (36.3 C) (abnormal). Her blood pressure is 111/76 and her pulse is 75. Her respiration is 14 and oxygen saturation is 99%.   The patient indicates doing extremely well and she says that pretty much all of her pain is currently gone.  She is still scheduled to see the headache [facility] clinic for some possible injections for her headache.  I have recommended that she keep that appointment.  She has also recognized that certain types of activity cause her to have some of these pains in the lower back and hips.  I have provided the patient today with some information regarding "preemptive analgesia".  At this point she is doing really well and does not need any further interventions.  I have given her a "as needed" appointment.  Post-Procedure Evaluation  Procedure: Diagnostic right SI joint injection #1 + diagnostic left IA hip injection #1 + bilateral trochanteric bursa injection #1, under fluoroscopic guidance, no sedation Pre-procedure pain level: 2/10 Post-procedure: 0/10 (100% relief)  Sedation: None.  Effectiveness during initial hour after procedure(Ultra-Short Term Relief): 100 %.  Local anesthetic used: Long-acting (4-6 hours) Effectiveness: Defined as any analgesic benefit obtained secondary to the administration of local anesthetics. This carries significant diagnostic value as to the etiological location, or anatomical origin, of the pain. Duration of benefit is expected to coincide with the duration of the local  anesthetic used.  Effectiveness during initial 4-6 hours after procedure(Short-Term Relief): 100 %.  Long-term benefit: Defined as  any relief past the pharmacologic duration of the local anesthetics.  Effectiveness past the initial 6 hours after procedure(Long-Term Relief): 90 %.  Current benefits: Defined as benefit that persist at this time.   Analgesia:  90-100% better Function: Grace Shepard reports improvement in function ROM: Grace Shepard reports improvement in ROM  Pharmacotherapy Assessment   Analgesic: None MME/day: 0 mg/day   Monitoring: [place] PMP: PDMP reviewed during this encounter.       Pharmacotherapy: No side-effects or adverse reactions reported. Compliance: No problems identified. Effectiveness: Clinically acceptable.  Landis Martins, RN  02/22/2020  2:46 PM  Sign when Signing Visit Safety precautions to be maintained throughout the outpatient stay will include: orient to surroundings, keep bed in low position, maintain call bell within reach at all times, provide assistance with transfer out of bed and ambulation.     UDS: No results found for: SUMMARY   ROS  Constitutional: Denies any fever or chills Gastrointestinal: No reported hemesis, hematochezia, vomiting, or acute GI distress Musculoskeletal: Denies any acute onset joint swelling, redness, loss of ROM, or weakness Neurological: No reported episodes of acute onset apraxia, aphasia, dysarthria, agnosia, amnesia, paralysis, loss of coordination, or loss of consciousness  Medication Review  NON FORMULARY, Turmeric, Vitamin D3, albuterol, baclofen, cetirizine, citalopram, levothyroxine, meloxicam, montelukast, pantoprazole, pregabalin, rosuvastatin, topiramate, triamcinolone cream, vitamin B-12, and zolpidem  History Review  Allergy: Grace Shepard is allergic to bactrim [sulfamethoxazole-trimethoprim], gabapentin, and tramadol. Drug: Grace Shepard  reports no history of drug use. Alcohol:  reports no history of alcohol  use. Tobacco:  reports that she has never smoked. She has never used smokeless tobacco. Social: Grace Shepard  reports that she has never smoked. She has never used smokeless tobacco. She reports that she does not drink alcohol and does not use drugs. 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 Surgical: Grace Shepard  has a past surgical history that includes Tubal ligation. Family: family history includes Alcohol abuse in her paternal uncle and sister; Aneurysm in her mother; Arthritis in her sister; Breast cancer in her maternal aunt; Congestive Heart Failure in her father; Crohn's disease in her sister; Depression in her father, sister, and sister; Diabetes in her brother and father; Drug abuse in her sister; Hearing loss in her father; Heart attack in her maternal grandfather; Heart disease in her sister; Osteoarthritis in her mother; Rheum arthritis in her father; Stroke in her mother.  Laboratory Chemistry Profile   Renal Lab Results  Component Value Date   BUN 17 01/17/2020   CREATININE 1.10 (H) 32/07/2481   BCR NOT APPLICABLE 50/10/7046   GFR 80.13 07/12/2019   GFRAA >60 01/17/2020   GFRNONAA 58 (L) 01/17/2020     Hepatic Lab Results  Component Value Date   AST 19 01/17/2020   ALT 22 01/17/2020   ALBUMIN 4.3 01/17/2020   ALKPHOS 123 01/17/2020   LIPASE 24.0 06/15/2015     Electrolytes Lab Results  Component Value Date   NA 140 01/17/2020   K 4.0 01/17/2020   CL 108 01/17/2020   CALCIUM 9.1 01/17/2020   MG 2.2 01/17/2020     Bone Lab Results  Component Value Date   VD25OH 59.14 01/17/2020     Inflammation (CRP: Acute Phase) (ESR: Chronic Phase) Lab Results  Component Value Date   CRP 1.4 (H) 01/17/2020   ESRSEDRATE 9 01/17/2020  Note: Above Lab results  reviewed.  Recent Imaging Review  DG PAIN CLINIC C-ARM 1-60 MIN NO REPORT Fluoro was used, but no Radiologist interpretation will be provided.  Please refer to "NOTES" tab for provider progress note. Note: Reviewed        Physical Exam  General appearance: Well nourished, well developed, and well hydrated. In no apparent acute distress Mental status: Alert, oriented x 3 (person, place, & time)       Respiratory: No evidence of acute respiratory distress Eyes: PERLA Vitals: BP 111/76   Pulse 75   Temp (!) 97.4 F (36.3 C) (Temporal)   Resp 14   Ht 4' 10"  (1.473 m)   Wt 150 lb (68 kg)   SpO2 99%   BMI 31.35 kg/m  BMI: Estimated body mass index is 31.35 kg/m as calculated from the following:   Height as of this encounter: 4' 10"  (1.473 m).   Weight as of this encounter: 150 lb (68 kg). Ideal: Patient must be at least 60 in tall to calculate ideal body weight  Assessment   Status Diagnosis  Controlled Controlled Controlled 1. Chronic pain syndrome   2. Chronic low back pain (1ry area of Pain) (Bilateral) (R>L) w/o sciatica   3. Chronic lower extremity pain (2ry area of Pain) (Bilateral) (R>L)   4. Pharmacologic therapy      Updated Problems: No problems updated.  Plan of Care  Problem-specific:  No problem-specific Assessment & Plan notes found for this encounter.  Grace Shepard has a current medication list which includes the following long-term medication(s): albuterol, citalopram, levothyroxine, montelukast, pantoprazole, pregabalin, rosuvastatin, topiramate, and zolpidem.  Pharmacotherapy (Medications Ordered): No orders of the defined types were placed in this encounter.  Orders:  No orders of the defined types were placed in this encounter.  Follow-up plan:   Return if symptoms worsen or fail to improve.      Interventional management options: Planned, scheduled, and/or pending:    Diagnostic right SI joint block #1 + left IA hip injection #1 +  bilateral trochanteric bursa injection #1 under fluoroscopic guidance and IV sedation (Today)   Considering:   Diagnostic bilateral sacroiliac joint block  Diagnostic bilateral trochanteric bursa injections  Diagnostic bilateral IA hip joint injections  Diagnostic right-sided cervical ESI  Diagnostic right-sided occipital nerve blocks  Possible right occipital nerve RFA  Diagnostic right C2/TON nerve block  Possible right C2/TON RFA    PRN Procedures:   None at this time     Recent Visits Date Type Provider Dept  02/08/20 Procedure visit Milinda Pointer, MD [facility]  02/07/20 Office Visit Milinda Pointer, MD [facility]  01/17/20 Office Visit Milinda Pointer, MD [facility]  Showing recent visits within past 90 days and meeting all other requirements Today's Visits Date Type Provider Dept  02/22/20 Office Visit Milinda Pointer, MD [facility]  Showing today's visits and meeting all other requirements Future Appointments No visits were found meeting these conditions. Showing future appointments within next 90 days and meeting all other requirements  I discussed the assessment and treatment plan with the patient. The patient was provided an opportunity to ask questions and all were answered. The patient agreed with the plan and demonstrated an understanding of the instructions.  Patient advised to call back or seek an in-person evaluation if the symptoms or condition worsens.  Duration of encounter: 30 minutes.  Note by: Gaspar Cola, MD Date: 02/22/2020; Time: 3:15 PM

## 2020-02-22 ENCOUNTER — Other Ambulatory Visit: Payer: Self-pay

## 2020-02-22 ENCOUNTER — Ambulatory Visit: Attending: Pain Medicine | Admitting: Pain Medicine

## 2020-02-22 ENCOUNTER — Encounter: Payer: Self-pay | Admitting: Pain Medicine

## 2020-02-22 VITALS — BP 111/76 | HR 75 | Temp 97.4°F | Resp 14 | Ht <= 58 in | Wt 150.0 lb

## 2020-02-22 DIAGNOSIS — M79605 Pain in left leg: Secondary | ICD-10-CM | POA: Diagnosis present

## 2020-02-22 DIAGNOSIS — G8929 Other chronic pain: Secondary | ICD-10-CM | POA: Diagnosis present

## 2020-02-22 DIAGNOSIS — Z79899 Other long term (current) drug therapy: Secondary | ICD-10-CM | POA: Diagnosis not present

## 2020-02-22 DIAGNOSIS — M79604 Pain in right leg: Secondary | ICD-10-CM | POA: Diagnosis not present

## 2020-02-22 DIAGNOSIS — M545 Low back pain: Secondary | ICD-10-CM | POA: Diagnosis not present

## 2020-02-22 DIAGNOSIS — G894 Chronic pain syndrome: Secondary | ICD-10-CM | POA: Diagnosis not present

## 2020-02-22 NOTE — Patient Instructions (Signed)

## 2020-02-22 NOTE — Progress Notes (Signed)
Safety precautions to be maintained throughout the outpatient stay will include: orient to surroundings, keep bed in low position, maintain call bell within reach at all times, provide assistance with transfer out of bed and ambulation.  

## 2020-02-25 ENCOUNTER — Other Ambulatory Visit: Payer: Self-pay

## 2020-02-29 ENCOUNTER — Telehealth: Payer: Self-pay | Admitting: Internal Medicine

## 2020-02-29 MED ORDER — LEVOTHYROXINE SODIUM 75 MCG PO TABS: 75.0000 ug | ORAL_TABLET | Freq: Every day | ORAL | 0 refills | Status: DC

## 2020-02-29 NOTE — Telephone Encounter (Signed)
Patient called office stating she was running out and needed a refill for lezothyroxine. She stated that it was denied but I went ahead and got her scheduled for cpe and labs in august.

## 2020-02-29 NOTE — Telephone Encounter (Signed)
Rx sent through [facility]  

## 2020-03-10 ENCOUNTER — Telehealth: Payer: Self-pay | Admitting: Pain Medicine

## 2020-03-10 NOTE — Telephone Encounter (Signed)
Patient lvmail asking if she can get a referral for physical therapy for her bursitis in her hips. Says it was talked about in appointments but has not been ordered yet.

## 2020-03-27 ENCOUNTER — Other Ambulatory Visit: Payer: Self-pay | Admitting: Pain Medicine

## 2020-03-27 DIAGNOSIS — M7061 Trochanteric bursitis, right hip: Secondary | ICD-10-CM

## 2020-03-27 DIAGNOSIS — G8929 Other chronic pain: Secondary | ICD-10-CM

## 2020-04-10 ENCOUNTER — Ambulatory Visit (INDEPENDENT_AMBULATORY_CARE_PROVIDER_SITE_OTHER): Payer: Self-pay

## 2020-04-10 ENCOUNTER — Other Ambulatory Visit: Payer: Self-pay

## 2020-04-10 DIAGNOSIS — Z872 Personal history of diseases of the skin and subcutaneous tissue: Secondary | ICD-10-CM

## 2020-04-10 DIAGNOSIS — L578 Other skin changes due to chronic exposure to nonionizing radiation: Secondary | ICD-10-CM

## 2020-04-10 DIAGNOSIS — I781 Nevus, non-neoplastic: Secondary | ICD-10-CM

## 2020-04-11 ENCOUNTER — Other Ambulatory Visit: Payer: Self-pay

## 2020-04-11 ENCOUNTER — Ambulatory Visit (INDEPENDENT_AMBULATORY_CARE_PROVIDER_SITE_OTHER): Admitting: Internal Medicine

## 2020-04-11 ENCOUNTER — Encounter: Payer: Self-pay | Admitting: Internal Medicine

## 2020-04-11 VITALS — BP 118/82 | HR 73 | Temp 98.2°F | Ht 58.5 in | Wt 153.0 lb

## 2020-04-11 DIAGNOSIS — N3020 Other chronic cystitis without hematuria: Secondary | ICD-10-CM

## 2020-04-11 DIAGNOSIS — G43019 Migraine without aura, intractable, without status migrainosus: Secondary | ICD-10-CM

## 2020-04-11 DIAGNOSIS — Z0000 Encounter for general adult medical examination without abnormal findings: Secondary | ICD-10-CM | POA: Diagnosis not present

## 2020-04-11 DIAGNOSIS — E039 Hypothyroidism, unspecified: Secondary | ICD-10-CM | POA: Diagnosis not present

## 2020-04-11 DIAGNOSIS — F329 Major depressive disorder, single episode, unspecified: Secondary | ICD-10-CM

## 2020-04-11 DIAGNOSIS — M797 Fibromyalgia: Secondary | ICD-10-CM

## 2020-04-11 DIAGNOSIS — K219 Gastro-esophageal reflux disease without esophagitis: Secondary | ICD-10-CM

## 2020-04-11 DIAGNOSIS — R296 Repeated falls: Secondary | ICD-10-CM

## 2020-04-11 DIAGNOSIS — F419 Anxiety disorder, unspecified: Secondary | ICD-10-CM

## 2020-04-11 DIAGNOSIS — E7800 Pure hypercholesterolemia, unspecified: Secondary | ICD-10-CM

## 2020-04-11 DIAGNOSIS — F32A Depression, unspecified: Secondary | ICD-10-CM

## 2020-04-11 DIAGNOSIS — F5104 Psychophysiologic insomnia: Secondary | ICD-10-CM

## 2020-04-11 LAB — CBC
HCT: 40.5 % (ref 36.0–46.0)
Hemoglobin: 13.7 g/dL (ref 12.0–15.0)
MCHC: 33.7 g/dL (ref 30.0–36.0)
MCV: 86.6 fl (ref 78.0–100.0)
Platelets: 233 10*3/uL (ref 150.0–400.0)
RBC: 4.68 Mil/uL (ref 3.87–5.11)
RDW: 13.5 % (ref 11.5–15.5)
WBC: 7.4 10*3/uL (ref 4.0–10.5)

## 2020-04-11 LAB — COMPREHENSIVE METABOLIC PANEL
ALT: 18 U/L (ref 0–35)
AST: 16 U/L (ref 0–37)
Albumin: 4.5 g/dL (ref 3.5–5.2)
Alkaline Phosphatase: 106 U/L (ref 39–117)
BUN: 10 mg/dL (ref 6–23)
CO2: 26 mEq/L (ref 19–32)
Calcium: 9.5 mg/dL (ref 8.4–10.5)
Chloride: 110 mEq/L (ref 96–112)
Creatinine, Ser: 0.92 mg/dL (ref 0.40–1.20)
GFR: 64.09 mL/min (ref >60.00)
Glucose, Bld: 84 mg/dL (ref 70–99)
Potassium: 4.2 mEq/L (ref 3.5–5.1)
Sodium: 143 mEq/L (ref 135–145)
Total Bilirubin: 0.4 mg/dL (ref 0.2–1.2)
Total Protein: 6.6 g/dL (ref 6.0–8.3)

## 2020-04-11 LAB — TSH: TSH: 0.86 u[IU]/mL (ref 0.35–4.50)

## 2020-04-11 LAB — LIPID PANEL
Cholesterol: 122 mg/dL (ref 0–200)
HDL: 53.6 mg/dL (ref >39.00)
LDL Cholesterol: 47 mg/dL (ref 0–99)
NonHDL: 68.76
Total CHOL/HDL Ratio: 2
Triglycerides: 107 mg/dL (ref 0.0–149.0)
VLDL: 21.4 mg/dL (ref 0.0–40.0)

## 2020-04-11 LAB — HEMOGLOBIN A1C: Hgb A1c MFr Bld: 5.6 % (ref 4.6–6.5)

## 2020-04-11 LAB — VITAMIN D 25 HYDROXY (VIT D DEFICIENCY, FRACTURES): VITD: 59.05 ng/mL (ref 30.00–100.00)

## 2020-04-11 LAB — T4, FREE: Free T4: 0.85 ng/dL (ref 0.60–1.60)

## 2020-04-11 MED ORDER — BUPROPION HCL ER (XL) 150 MG PO TB24: 150.0000 mg | ORAL_TABLET | Freq: Every day | ORAL | 2 refills | Status: DC

## 2020-04-11 NOTE — Assessment & Plan Note (Signed)
Controlled with Topamax and Baclofen She will continue to follow with neurology

## 2020-04-11 NOTE — Assessment & Plan Note (Signed)
She has feelings of being overwhelmed Citalopram at the max dose We will add Wellbutrin 150 mg XL p.o. nightly-discussed common side effects  Update me in 3 weeks and let me know how you are doing

## 2020-04-11 NOTE — Assessment & Plan Note (Signed)
TSH and free T4 today We will adjust Levothyroxine if needed based on labs 

## 2020-04-11 NOTE — Progress Notes (Signed)
Subjective:    Patient ID: Grace Shepard, female    DOB: 01-12-1966, 54 y.o.   MRN: [id number]  HPI  Pt presents to the clinic today for her annual exam. She is also due to follow up chronic conditions.  Anxiety and Depression: Managed on Citalopram. She is not currently seeing a therapist. She denies SI/HI.  Fibromyalgia: Managed with Meloxicam and Lyrica as prescribed. She follows with Dr. Wynona Canes.  GERD: Triggered by spicy foods. She denies breakthrough on Pantoprazole. Upper GI from 2019 reviewed.  Insomnia: She takes Ambien as prescribed. She reports history of sleep study in 2017, but not available for review in Epic.  Migraines: These rarely occur. Managed with Topamax. She takes Tylenol and Baclofen as needed with good relief of symptoms. She follows with Dr. Melrose Nakayama.  HLD: Her last LDL was 48, 06/2019. She denies myalgias on Rosuvastatin. She does not consume a low fat diet.  Chronic Cystitis: She denies any current issues at this time.  Hypothyroidism: She denies any issues on her current dose of Levothyroxine. She does not follow with endocrinology.  Flu: 06/2018 Tetanus: 06/2016 Covid: never Pap Smear: 02/2018 Mammogram: 02/2019 Colon Screening: 2018, every 5 years Vision Screening: annually Dentist: biannually  Diet: She does eat meat. She consumes some fruits and veggies daily. She does eat fried foods. She drinks mostly water, Dr. Malachi Bonds. Exercise: None   Review of Systems      Past Medical History:  Diagnosis Date  . Allergic rhinitis   . Arthritis   . Depression   . Fibromyalgia   . 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  . Hyperlipidemia   . Ischemic colitis ([place])   . Migraine   . Thyroid disease   . UTI (lower urinary tract infection)     Current Outpatient Medications  Medication Sig Dispense Refill  . albuterol (PROVENTIL HFA;VENTOLIN HFA) 108 (90 Base) MCG/ACT inhaler Inhale into the lungs.    . baclofen  (LIORESAL) 10 MG tablet Take 10 mg by mouth as needed for muscle spasms.    . cetirizine (ZYRTEC) 10 MG tablet Take 10 mg by mouth daily.    . Cholecalciferol (VITAMIN D3) 125 MCG (5000 UT) CAPS Take by mouth.    . citalopram (CELEXA) 40 MG tablet TAKE 1 TABLET DAILY 90 tablet 2  . levothyroxine (SYNTHROID) 75 MCG tablet Take 1 tablet (75 mcg total) by mouth daily. 90 tablet 0  . meloxicam (MOBIC) 15 MG tablet Take 15 mg by mouth daily.    . montelukast (SINGULAIR) 10 MG tablet Take 1 tablet (10 mg total) by mouth at bedtime. SCHEDULE PHYSICAL EXAM FOR MORE REFILLS 90 tablet 0  . NON FORMULARY at bedtime. Hemp gummies    . pantoprazole (PROTONIX) 40 MG tablet Take 1 tablet (40 mg total) by mouth daily. 90 tablet 2  . pregabalin (LYRICA) 75 MG capsule TAKE 1 CAPSULE TWICE A DAY 180 capsule 1  . rosuvastatin (CRESTOR) 10 MG tablet TAKE 1 TABLET DAILY 90 tablet 0  . topiramate (TOPAMAX) 50 MG tablet 100 mg daily.     Marland Kitchen triamcinolone cream (KENALOG) 0.1 % Apply 1 application topically 2 (two) times daily as needed. Avoid face, groin, axilla 30 g 0  . Turmeric 500 MG CAPS Take 500 mg by mouth daily. 90 capsule 0  . vitamin B-12 (CYANOCOBALAMIN) 1000 MCG tablet Take 50 mcg by mouth every other day.     . zolpidem (AMBIEN CR) 6.25 MG CR tablet  Take 1 tablet (6.25 mg total) by mouth at bedtime as needed. MUST SCHEDULE PHYSICAL 90 tablet 0   No current facility-administered medications for this visit.    Allergies  Allergen Reactions  . Bactrim [Sulfamethoxazole-Trimethoprim] Hives  . Gabapentin Other (See Comments)  . Tramadol Itching    Family History  Problem Relation Age of Onset  . Drug abuse Sister   . Alcohol abuse Sister   . Depression Sister   . Rheum arthritis Father   . Congestive Heart Failure Father   . Depression Father   . Diabetes Father   . Hearing loss Father   . Osteoarthritis Mother   . Stroke Mother   . Aneurysm Mother   . Breast cancer Maternal Aunt   . Heart  attack Maternal Grandfather   . Diabetes Brother   . Alcohol abuse Paternal Uncle   . Arthritis Sister   . Depression Sister   . Heart disease Sister   . Crohn's disease Sister     Social History   Socioeconomic History  . Marital status: Married    Spouse name: Not on file  . Number of children: Not on file  . Years of education: Not on file  . Highest education level: Not on file  Occupational History  . Not on file  Tobacco Use  . Smoking status: Never Smoker  . Smokeless tobacco: Never Used  Vaping Use  . Vaping Use: Never used  Substance and Sexual Activity  . Alcohol use: No  . Drug use: No  . Sexual activity: Yes    Comment: Perimenopausal  Other Topics Concern  . Not on file  Social History Narrative  . Not on file   Social Determinants of Health   Financial Resource Strain:   . Difficulty of Paying Living Expenses: Not on file  Food Insecurity:   . Worried About Charity fundraiser in the Last Year: Not on file  . Ran Out of Food in the Last Year: Not on file  Transportation Needs:   . Lack of Transportation (Medical): Not on file  . Lack of Transportation (Non-Medical): Not on file  Physical Activity:   . Days of Exercise per Week: Not on file  . Minutes of Exercise per Session: Not on file  Stress:   . Feeling of Stress : Not on file  Social Connections:   . Frequency of Communication with Friends and Family: Not on file  . Frequency of Social Gatherings with Friends and Family: Not on file  . Attends Religious Services: Not on file  . Active Member of Clubs or Organizations: Not on file  . Attends Archivist Meetings: Not on file  . Marital Status: Not on file  Intimate Partner Violence:   . Fear of Current or Ex-Partner: Not on file  . Emotionally Abused: Not on file  . Physically Abused: Not on file  . Sexually Abused: Not on file     Constitutional: Denies fever, malaise, fatigue, or abrupt weight changes.  HEENT: Denies eye  pain, eye redness, ear pain, ringing in the ears, wax buildup, runny nose, nasal congestion, bloody nose, or sore throat. Respiratory: Denies difficulty breathing, shortness of breath, cough or sputum production.   Cardiovascular: Denies chest pain, chest tightness, palpitations or swelling in the hands or feet.  Gastrointestinal: Patient reports intermittent reflux.  Denies abdominal pain, bloating, constipation, diarrhea or blood in the stool.  GU: Denies urgency, frequency, pain with urination, burning sensation, blood in urine,  odor or discharge. Musculoskeletal: Patient reports chronic muscle and joint pain, frequent falls.  Denies decrease in range of motion, difficulty with gait, or joint swelling.  Skin: Denies redness, rashes, lesions or ulcercations.  Neurological: Pt reports intermittent dizziness. Denies difficulty with memory, difficulty with speech or problems with balance and coordination.  Psych: Patient has a history of anxiety and depression.  Denies SI/HI.  No other specific complaints in a complete review of systems (except as listed in HPI above).  Objective:   Physical Exam   BP 118/82   Pulse 73   Temp 98.2 F (36.8 C) (Temporal)   Ht 4' 10.5" (1.486 m)   Wt 153 lb (69.4 kg)   SpO2 98%   BMI 31.43 kg/m   Wt Readings from Last 3 Encounters:  02/22/20 150 lb (68 kg)  02/08/20 151 lb (68.5 kg)  02/07/20 151 lb (68.5 kg)    General: Appears her stated age, obese, in NAD. Skin: Warm, dry and intact. No rashes noted. HEENT: Head: normal shape and size; Eyes: sclera white, no icterus, conjunctiva pink, PERRLA and EOMs intact;  Neck:  Neck supple, trachea midline. No masses, lumps or thyromegaly present.  Cardiovascular: Normal rate and rhythm. S1,S2 noted.  No murmur, rubs or gallops noted. No JVD or BLE edema. No carotid bruits noted. Pulmonary/Chest: Normal effort and positive vesicular breath sounds. No respiratory distress. No wheezes, rales or ronchi noted.    Abdomen: Soft and nontender. Normal bowel sounds. No distention or masses noted. Liver, spleen and kidneys non palpable. Musculoskeletal: Strength 5/5 BUE/BLE. Able to tandem walk without difficulty. No difficulty with gait.  Neurological: Alert and oriented. Cranial nerves II-XII grossly intact. Coordination normal.  Psychiatric: Mood and affect flat. Behavior is normal. Judgment and thought content normal.     BMET    Component Value Date/Time   NA 140 01/17/2020 1346   NA 139 05/29/2014 0516   K 4.0 01/17/2020 1346   K 3.6 05/29/2014 0516   CL 108 01/17/2020 1346   CL 104 05/29/2014 0516   CO2 24 01/17/2020 1346   CO2 24 05/29/2014 0516   GLUCOSE 86 01/17/2020 1346   GLUCOSE 130 (H) 05/29/2014 0516   BUN 17 01/17/2020 1346   BUN 7 05/29/2014 0516   CREATININE 1.10 (H) 01/17/2020 1346   CREATININE 0.87 09/11/2018 1628   CALCIUM 9.1 01/17/2020 1346   CALCIUM 8.6 05/29/2014 0516   GFRNONAA 58 (L) 01/17/2020 1346   GFRNONAA >60 05/29/2014 0516   GFRNONAA >60 06/28/2013 0511   GFRAA >60 01/17/2020 1346   GFRAA >60 05/29/2014 0516   GFRAA >60 06/28/2013 0511    Lipid Panel     Component Value Date/Time   CHOL 130 07/12/2019 1051   TRIG 178.0 (H) 07/12/2019 1051   HDL 47.30 07/12/2019 1051   CHOLHDL 3 07/12/2019 1051   VLDL 35.6 07/12/2019 1051   LDLCALC 48 07/12/2019 1051   LDLCALC 109 (H) 09/11/2018 1628    CBC    Component Value Date/Time   WBC 6.8 03/04/2019 1134   RBC 4.63 03/04/2019 1134   HGB 13.7 03/04/2019 1134   HGB 13.9 05/29/2014 0516   HCT 40.6 03/04/2019 1134   HCT 41.3 05/29/2014 0516   PLT 280.0 03/04/2019 1134   PLT 227 05/29/2014 0516   MCV 87.7 03/04/2019 1134   MCV 89 05/29/2014 0516   MCH 30.4 06/16/2016 1828   MCHC 33.7 03/04/2019 1134   RDW 13.2 03/04/2019 1134   RDW 13.2 05/29/2014  0516   LYMPHSABS 1.9 12/30/2014 1425   LYMPHSABS 1.8 05/29/2014 0516   MONOABS 0.7 12/30/2014 1425   MONOABS 1.1 (H) 05/29/2014 0516   EOSABS 0.2  12/30/2014 1425   EOSABS 0.2 05/29/2014 0516   BASOSABS 0.1 12/30/2014 1425   BASOSABS 0.1 05/29/2014 0516    Hgb A1C Lab Results  Component Value Date   HGBA1C 5.7 03/04/2019           Assessment & Plan:   Preventative Health Maintenance:  Encouraged her to get a flu shot the fall Tetanus UTD Encouraged her to get a Covid vaccine Pap smear UTD Advised her to schedule her mammogram and [facility], past due Colon screening UTD Encouraged her to consume a balanced diet and exercise regimen Advised her to see an eye doctor and dentist annually We will check CBC, C met, lipid, vitamin D, A1C, TSH and free T4 today  Multiple Falls:  Referral to PT for further evaluation Strength, balance and coordination seem normal  RTC in 1 year, sooner if needed, update me in 3 weeks and let me know how anxiety and depression is doing. [name], NP This visit occurred during the SARS-CoV-2 public health emergency.  Safety protocols were in place, including screening questions prior to the visit, additional usage of staff PPE, and extensive cleaning of exam room while observing appropriate contact time as indicated for disinfecting solutions.

## 2020-04-11 NOTE — Assessment & Plan Note (Signed)
Continue Pantoprazole CBC and C met today Avoid spicy foods

## 2020-04-11 NOTE — Assessment & Plan Note (Signed)
She is considering starting CBD oil to try to get off the Ambien We will monitor

## 2020-04-11 NOTE — Assessment & Plan Note (Signed)
Currently not an issue °We will monitor °

## 2020-04-11 NOTE — Assessment & Plan Note (Signed)
Continue Meloxicam and Lyrica She will continue to follow with pain management

## 2020-04-11 NOTE — Assessment & Plan Note (Signed)
C met and lipid profile today Continue Rosuvastatin Encouraged her to consume a low-fat diet 

## 2020-04-11 NOTE — Patient Instructions (Signed)

## 2020-04-14 ENCOUNTER — Encounter: Payer: Self-pay | Admitting: Internal Medicine

## 2020-04-28 ENCOUNTER — Other Ambulatory Visit

## 2020-04-28 ENCOUNTER — Other Ambulatory Visit: Payer: Self-pay

## 2020-04-28 DIAGNOSIS — Z20822 Contact with and (suspected) exposure to covid-19: Secondary | ICD-10-CM

## 2020-05-01 LAB — NOVEL CORONAVIRUS, NAA: SARS-CoV-2, NAA: NOT DETECTED

## 2020-05-16 ENCOUNTER — Other Ambulatory Visit: Payer: Self-pay | Admitting: Internal Medicine

## 2020-05-18 ENCOUNTER — Other Ambulatory Visit: Payer: Self-pay

## 2020-05-18 NOTE — Telephone Encounter (Signed)
LAST APPOINTMENT DATE: 04/11/2020 for CPE    NEXT APPOINTMENT DATE: Visit date not found    LAST REFILL: 02/10/2020  QTY: #[phone] [id number]

## 2020-05-19 ENCOUNTER — Other Ambulatory Visit: Payer: Self-pay | Admitting: Internal Medicine

## 2020-05-19 DIAGNOSIS — E7800 Pure hypercholesterolemia, unspecified: Secondary | ICD-10-CM

## 2020-05-19 MED ORDER — ZOLPIDEM TARTRATE ER 6.25 MG PO TBCR: 6.2500 mg | EXTENDED_RELEASE_TABLET | Freq: Every evening | ORAL | 0 refills | Status: DC | PRN

## 2020-05-19 NOTE — Telephone Encounter (Signed)
this is a duplicate request already filled

## 2020-06-13 ENCOUNTER — Other Ambulatory Visit: Payer: Self-pay

## 2020-06-13 ENCOUNTER — Encounter: Payer: Self-pay | Admitting: Internal Medicine

## 2020-06-13 ENCOUNTER — Telehealth (INDEPENDENT_AMBULATORY_CARE_PROVIDER_SITE_OTHER): Admitting: Internal Medicine

## 2020-06-13 VITALS — Wt 150.0 lb

## 2020-06-13 DIAGNOSIS — B9789 Other viral agents as the cause of diseases classified elsewhere: Secondary | ICD-10-CM

## 2020-06-13 DIAGNOSIS — R300 Dysuria: Secondary | ICD-10-CM | POA: Diagnosis not present

## 2020-06-13 DIAGNOSIS — E7800 Pure hypercholesterolemia, unspecified: Secondary | ICD-10-CM

## 2020-06-13 DIAGNOSIS — J329 Chronic sinusitis, unspecified: Secondary | ICD-10-CM

## 2020-06-13 MED ORDER — PREDNISONE 10 MG PO TABS: ORAL_TABLET | ORAL | 0 refills | Status: DC

## 2020-06-13 MED ORDER — ROSUVASTATIN CALCIUM 10 MG PO TABS: 10.0000 mg | ORAL_TABLET | Freq: Every day | ORAL | 0 refills | Status: DC

## 2020-06-13 NOTE — Progress Notes (Signed)
Virtual Visit via Video Note  I connected with Grace Shepard on 06/13/20 at  3:45 PM EDT by a video enabled telemedicine application and verified that I am speaking with the correct person using two identifiers.  Location: Patient: In the car Provider: Office  Person's participating in this video call: [name], NP-C and [name].   I discussed the limitations of evaluation and management by telemedicine and the availability of in person appointments. The patient expressed understanding and agreed to proceed.  History of Present Illness:  Pt reports headache, facial pain and pressure, nasal congestion, ear fullness, sore throat and cough. This started 1 week ago. She is blowing clear mucous out of her nose. She denies difficulty swallowing. The cough is productive of clear fluid. She denies fever, chills or body aches. She has tried Zyrtec, Flonase, Singulair and Mucinex with some relief of symptoms.  She has not had sick contacts or exposure that she is aware of. She has had her Covid vaccine.  She also reports dysuria. This started 2-3 weeks ago. She denies abdominal/pelvic pain, urgency, frequency, blood in her urine, vaginal discharge, odor or abnormal bleeding. She denies fever, chills or body aches. She has not tried anything OTC for these symptoms.     Past Medical History:  Diagnosis Date  . Allergic rhinitis   . Arthritis   . Depression   . Fibromyalgia   . 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  . Hyperlipidemia   . Ischemic colitis ([place])   . Migraine   . Thyroid disease   . UTI (lower urinary tract infection)     Current Outpatient Medications  Medication Sig Dispense Refill  . albuterol (PROVENTIL HFA;VENTOLIN HFA) 108 (90 Base) MCG/ACT inhaler Inhale into the lungs.    . baclofen (LIORESAL) 10 MG tablet Take 10 mg by mouth as needed for muscle spasms.    Marland Kitchen buPROPion (WELLBUTRIN XL) 150 MG 24 hr tablet Take 1 tablet (150 mg total) by  mouth daily. 30 tablet 2  . cetirizine (ZYRTEC) 10 MG tablet Take 10 mg by mouth daily.    . Cholecalciferol (VITAMIN D3) 125 MCG (5000 UT) CAPS Take by mouth.    . citalopram (CELEXA) 40 MG tablet TAKE 1 TABLET DAILY 90 tablet 2  . levothyroxine (SYNTHROID) 75 MCG tablet TAKE 1 TABLET DAILY 90 tablet 2  . meloxicam (MOBIC) 15 MG tablet Take 15 mg by mouth daily.    . montelukast (SINGULAIR) 10 MG tablet Take 1 tablet (10 mg total) by mouth at bedtime. 90 tablet 3  . NON FORMULARY at bedtime. Hemp gummies    . pantoprazole (PROTONIX) 40 MG tablet Take 1 tablet (40 mg total) by mouth daily. 90 tablet 2  . pregabalin (LYRICA) 75 MG capsule TAKE 1 CAPSULE TWICE A DAY 180 capsule 1  . rosuvastatin (CRESTOR) 10 MG tablet TAKE 1 TABLET DAILY 90 tablet 2  . topiramate (TOPAMAX) 50 MG tablet 100 mg daily.     Marland Kitchen triamcinolone cream (KENALOG) 0.1 % Apply 1 application topically 2 (two) times daily as needed. Avoid face, groin, axilla 30 g 0  . vitamin B-12 (CYANOCOBALAMIN) 1000 MCG tablet Take 50 mcg by mouth every other day.     . zolpidem (AMBIEN CR) 6.25 MG CR tablet Take 1 tablet (6.25 mg total) by mouth at bedtime as needed. MUST SCHEDULE PHYSICAL 90 tablet 0   No current facility-administered medications for this visit.    Allergies  Allergen  Reactions  . Bactrim [Sulfamethoxazole-Trimethoprim] Hives  . Gabapentin Other (See Comments)  . Tramadol Itching    Family History  Problem Relation Age of Onset  . Drug abuse Sister   . Alcohol abuse Sister   . Depression Sister   . Rheum arthritis Father   . Congestive Heart Failure Father   . Depression Father   . Diabetes Father   . Hearing loss Father   . Osteoarthritis Mother   . Stroke Mother   . Aneurysm Mother   . Breast cancer Maternal Aunt   . Heart attack Maternal Grandfather   . Diabetes Brother   . Alcohol abuse Paternal Uncle   . Arthritis Sister   . Depression Sister   . Heart disease Sister   . Crohn's disease Sister      Social History   Socioeconomic History  . Marital status: Married    Spouse name: Not on file  . Number of children: Not on file  . Years of education: Not on file  . Highest education level: Not on file  Occupational History  . Not on file  Tobacco Use  . Smoking status: Never Smoker  . Smokeless tobacco: Never Used  Vaping Use  . Vaping Use: Never used  Substance and Sexual Activity  . Alcohol use: No  . Drug use: No  . Sexual activity: Yes    Comment: Perimenopausal  Other Topics Concern  . Not on file  Social History Narrative  . Not on file   Social Determinants of Health   Financial Resource Strain:   . Difficulty of Paying Living Expenses: Not on file  Food Insecurity:   . Worried About Charity fundraiser in the Last Year: Not on file  . Ran Out of Food in the Last Year: Not on file  Transportation Needs:   . Lack of Transportation (Medical): Not on file  . Lack of Transportation (Non-Medical): Not on file  Physical Activity:   . Days of Exercise per Week: Not on file  . Minutes of Exercise per Session: Not on file  Stress:   . Feeling of Stress : Not on file  Social Connections:   . Frequency of Communication with Friends and Family: Not on file  . Frequency of Social Gatherings with Friends and Family: Not on file  . Attends Religious Services: Not on file  . Active Member of Clubs or Organizations: Not on file  . Attends Archivist Meetings: Not on file  . Marital Status: Not on file  Intimate Partner Violence:   . Fear of Current or Ex-Partner: Not on file  . Emotionally Abused: Not on file  . Physically Abused: Not on file  . Sexually Abused: Not on file     Constitutional: Pt reports headache. Denies fever, malaise, fatigue, or abrupt weight changes.  HEENT: Pt reports facial pain and pressure, nasal congestion, ear fulllness and sore throat. Denies eye pain, eye redness, ear pain, ringing in the ears, wax buildup, runny nose,  bloody nose. Respiratory: Pt reports cough. Denies difficulty breathing, shortness of breath.   Cardiovascular: Denies chest pain, chest tightness, palpitations or swelling in the hands or feet.  Gastrointestinal: Denies abdominal pain, bloating, constipation, diarrhea or blood in the stool.  GU: Pt reports dysuria. Denies urgency, frequency,  burning sensation, blood in urine, odor or discharge.  No other specific complaints in a complete review of systems (except as listed in HPI above).  Observations/Objective:  Wt 150 lb (68  kg)   BMI 30.82 kg/m   Wt Readings from Last 3 Encounters:  04/11/20 153 lb (69.4 kg)  02/22/20 150 lb (68 kg)  02/08/20 151 lb (68.5 kg)    General: Appears her stated age, obese, appears to not feel well but in NAD. HEENT: Head: normal shape and size, points to frontal and maxillary sinus tenderness as her site of pain; Nose: congestion noted; Throat/Mouth: hoarseness noted.  Pulmonary/Chest: Normal effort. No respiratory distress.  Neurological: Alert and oriented.   BMET    Component Value Date/Time   NA 143 04/11/2020 1205   NA 139 05/29/2014 0516   K 4.2 04/11/2020 1205   K 3.6 05/29/2014 0516   CL 110 04/11/2020 1205   CL 104 05/29/2014 0516   CO2 26 04/11/2020 1205   CO2 24 05/29/2014 0516   GLUCOSE 84 04/11/2020 1205   GLUCOSE 130 (H) 05/29/2014 0516   BUN 10 04/11/2020 1205   BUN 7 05/29/2014 0516   CREATININE 0.92 04/11/2020 1205   CREATININE 0.87 09/11/2018 1628   CALCIUM 9.5 04/11/2020 1205   CALCIUM 8.6 05/29/2014 0516   GFRNONAA 58 (L) 01/17/2020 1346   GFRNONAA >60 05/29/2014 0516   GFRNONAA >60 06/28/2013 0511   GFRAA >60 01/17/2020 1346   GFRAA >60 05/29/2014 0516   GFRAA >60 06/28/2013 0511    Lipid Panel     Component Value Date/Time   CHOL 122 04/11/2020 1205   TRIG 107.0 04/11/2020 1205   HDL 53.60 04/11/2020 1205   CHOLHDL 2 04/11/2020 1205   VLDL 21.4 04/11/2020 1205   LDLCALC 47 04/11/2020 1205   LDLCALC  109 (H) 09/11/2018 1628    CBC    Component Value Date/Time   WBC 7.4 04/11/2020 1205   RBC 4.68 04/11/2020 1205   HGB 13.7 04/11/2020 1205   HGB 13.9 05/29/2014 0516   HCT 40.5 04/11/2020 1205   HCT 41.3 05/29/2014 0516   PLT 233.0 04/11/2020 1205   PLT 227 05/29/2014 0516   MCV 86.6 04/11/2020 1205   MCV 89 05/29/2014 0516   MCH 30.4 06/16/2016 1828   MCHC 33.7 04/11/2020 1205   RDW 13.5 04/11/2020 1205   RDW 13.2 05/29/2014 0516   LYMPHSABS 1.9 12/30/2014 1425   LYMPHSABS 1.8 05/29/2014 0516   MONOABS 0.7 12/30/2014 1425   MONOABS 1.1 (H) 05/29/2014 0516   EOSABS 0.2 12/30/2014 1425   EOSABS 0.2 05/29/2014 0516   BASOSABS 0.1 12/30/2014 1425   BASOSABS 0.1 05/29/2014 0516    Hgb A1C Lab Results  Component Value Date   HGBA1C 5.6 04/11/2020       Assessment and Plan:  Viral Sinusitis:  Continue Zyrtec and Singulair RX for Pred  Taper x 6 days No indication for abx or covid testing at this unless she worsens  Dysuria:  She will drop of urine sample Push fluids  Will follow up after urinalysis, return precautions discussed Follow Up Instructions:    I discussed the assessment and treatment plan with the patient. The patient was provided an opportunity to ask questions and all were answered. The patient agreed with the plan and demonstrated an understanding of the instructions.   The patient was advised to call back or seek an in-person evaluation if the symptoms worsen or if the condition fails to improve as anticipated.   [name], NP

## 2020-06-13 NOTE — Patient Instructions (Signed)

## 2020-06-20 ENCOUNTER — Telehealth: Payer: Self-pay | Admitting: Internal Medicine

## 2020-06-20 NOTE — Telephone Encounter (Signed)
Pt called needing to get a refill  Citalopram  Pt is out of meds  [facility] [address]

## 2020-06-21 ENCOUNTER — Other Ambulatory Visit: Payer: Self-pay

## 2020-06-21 ENCOUNTER — Encounter: Payer: Self-pay | Admitting: Internal Medicine

## 2020-06-21 ENCOUNTER — Other Ambulatory Visit (INDEPENDENT_AMBULATORY_CARE_PROVIDER_SITE_OTHER)

## 2020-06-21 DIAGNOSIS — R300 Dysuria: Secondary | ICD-10-CM

## 2020-06-21 LAB — POC URINALSYSI DIPSTICK (AUTOMATED)
Bilirubin, UA: NEGATIVE
Blood, UA: NEGATIVE
Glucose, UA: NEGATIVE
Ketones, UA: NEGATIVE
Leukocytes, UA: NEGATIVE
Nitrite, UA: NEGATIVE
Protein, UA: NEGATIVE
Spec Grav, UA: 1.025 (ref 1.010–1.025)
Urobilinogen, UA: 0.2 E.U./dL
pH, UA: 6 (ref 5.0–8.0)

## 2020-06-21 MED ORDER — CITALOPRAM HYDROBROMIDE 40 MG PO TABS
40.0000 mg | ORAL_TABLET | Freq: Every day | ORAL | 1 refills | Status: DC
Start: 2020-06-21 — End: 2020-07-31

## 2020-06-21 NOTE — Telephone Encounter (Signed)
Rx sent through [facility]  

## 2020-06-21 NOTE — Telephone Encounter (Signed)
Pt called back in wanted to know about getting her prescription Citalopram

## 2020-06-23 MED ORDER — AMOXICILLIN-POT CLAVULANATE 875-125 MG PO TABS: 1.0000 | ORAL_TABLET | Freq: Two times a day (BID) | ORAL | 0 refills | Status: DC

## 2020-07-04 ENCOUNTER — Other Ambulatory Visit: Payer: Self-pay | Admitting: Internal Medicine

## 2020-07-19 ENCOUNTER — Telehealth: Payer: Self-pay | Admitting: Internal Medicine

## 2020-07-19 NOTE — Telephone Encounter (Signed)
Pt called in wanted to let us know that if [facility] call then plpease work with them she needs her prescriptions with them , she received a letter stating that [facility] and [facility] were not working together but they are they are not working with [employer].

## 2020-07-25 LAB — HM MAMMOGRAPHY

## 2020-07-26 ENCOUNTER — Encounter: Payer: Self-pay | Admitting: Internal Medicine

## 2020-07-27 ENCOUNTER — Encounter: Payer: Self-pay | Admitting: Internal Medicine

## 2020-07-31 ENCOUNTER — Other Ambulatory Visit: Payer: Self-pay | Admitting: *Deleted

## 2020-07-31 NOTE — Telephone Encounter (Signed)
Ambien last filled 05/19/2020 mail order please advise for refill to mail order due 08/19/2020

## 2020-07-31 NOTE — Telephone Encounter (Signed)
Representative from [facility] left a voicemail stating that they had faxed over a request for refills on 07/19/20 and have not heard anything back. They are requesting new scripts be sent to them for the following Bupropion Citalopram Zolpidem

## 2020-08-01 MED ORDER — ZOLPIDEM TARTRATE ER 6.25 MG PO TBCR: 6.2500 mg | EXTENDED_RELEASE_TABLET | Freq: Every evening | ORAL | 0 refills | Status: DC | PRN

## 2020-08-01 MED ORDER — CITALOPRAM HYDROBROMIDE 40 MG PO TABS: 40.0000 mg | ORAL_TABLET | Freq: Every day | ORAL | 1 refills | Status: DC

## 2020-08-01 MED ORDER — BUPROPION HCL ER (XL) 150 MG PO TB24: 150.0000 mg | ORAL_TABLET | Freq: Every day | ORAL | 0 refills | Status: DC

## 2020-09-24 NOTE — Progress Notes (Deleted)
No-show

## 2020-09-25 ENCOUNTER — Ambulatory Visit: Admitting: Pain Medicine

## 2020-10-11 ENCOUNTER — Encounter: Payer: Self-pay | Admitting: Internal Medicine

## 2020-10-11 ENCOUNTER — Other Ambulatory Visit: Payer: Self-pay

## 2020-10-11 ENCOUNTER — Ambulatory Visit (INDEPENDENT_AMBULATORY_CARE_PROVIDER_SITE_OTHER): Admitting: Internal Medicine

## 2020-10-11 VITALS — BP 116/74 | HR 74 | Temp 97.8°F | Wt 145.0 lb

## 2020-10-11 DIAGNOSIS — N941 Unspecified dyspareunia: Secondary | ICD-10-CM

## 2020-10-11 DIAGNOSIS — Z780 Asymptomatic menopausal state: Secondary | ICD-10-CM

## 2020-10-11 DIAGNOSIS — N898 Other specified noninflammatory disorders of vagina: Secondary | ICD-10-CM | POA: Diagnosis not present

## 2020-10-11 DIAGNOSIS — R3915 Urgency of urination: Secondary | ICD-10-CM | POA: Diagnosis not present

## 2020-10-11 DIAGNOSIS — R8290 Unspecified abnormal findings in urine: Secondary | ICD-10-CM | POA: Diagnosis not present

## 2020-10-11 LAB — POC URINALSYSI DIPSTICK (AUTOMATED)
Bilirubin, UA: NEGATIVE
Blood, UA: NEGATIVE
Glucose, UA: NEGATIVE
Ketones, UA: NEGATIVE
Leukocytes, UA: NEGATIVE
Nitrite, UA: NEGATIVE
Protein, UA: NEGATIVE
Spec Grav, UA: >=1.03 — AB (ref 1.010–1.025)
Urobilinogen, UA: 0.2 E.U./dL
pH, UA: 5.5 (ref 5.0–8.0)

## 2020-10-11 MED ORDER — ESTRADIOL 0.1 MG/GM VA CREA: 1.0000 | TOPICAL_CREAM | VAGINAL | 12 refills | Status: DC

## 2020-10-11 NOTE — Patient Instructions (Signed)
Atrophic Vaginitis  Atrophic vaginitis is when the lining of the vagina becomes dry and thin. This is most common in women who have stopped having their periods (are in menopause). It usually starts when a woman is 45 to 53 years old. What are the causes? This condition is caused by a drop in a female hormone (estrogen). What increases the risk? You are more likely to develop this condition if:  You take certain medicines.  You have had your ovaries taken out.  You are being treated for cancer.  You have given birth or are breastfeeding.  You are more than 53 years old.  You smoke. What are the signs or symptoms?  Pain during sex.  A feeling of pressure during sex.  Bleeding during sex.  Burning or itching in the vagina.  Burning pain when you pee (urinate).  Fluid coming from your vagina. Some people do not have symptoms. How is this treated?  Using a lubricant before sex.  Using a moisturizer in the vagina.  Using estrogen in the vagina. In some cases, you may not need treatment. Follow these instructions at home: Medicines  Take all medicines only as told by your doctor. This includes medicines for dryness.  Do not use herbal medicines unless your doctor says it is okay. General instructions  Talk with your doctor about treatment.  Do not douche.  Do not use scented: ? Sprays. ? Tampons. ? Soaps.  If sex hurts, try using lubricants right before you have sex. Contact a doctor if:  You have fluid coming from the vagina that is not like normal.  You have a bad smell coming from your vagina.  You have new symptoms.  Your symptoms do not get better when treated.  Your symptoms get worse. Summary  This condition happens when the lining of the vagina becomes dry and thin.  It is most common in women who no longer have periods.  Treatment may include using medicines for dryness.  Call a doctor if your symptoms do not get better. This  information is not intended to replace advice given to you by your health care provider. Make sure you discuss any questions you have with your health care provider. Document Revised: 01/27/2020 Document Reviewed: 01/27/2020 Elsevier Patient Education  2021 Elsevier Inc.   

## 2020-10-11 NOTE — Progress Notes (Signed)
HPI  Pt presents to the clinic today with c/o painful intercourse, urinary urgency and cloudy urine. She reports the symptoms have been intermittent for over a year. She is post menopausal since 2016. She has tried KY jelly for years, but reports this is no longer effective. She denies frequency, dysuria, blood in her urine, vaginal discharge, odor, abnormal vaginal bleeding, fever, chills, nausea or vomiting.    Review of Systems  Past Medical History:  Diagnosis Date  . Allergic rhinitis   . Arthritis   . Depression   . Fibromyalgia   . 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  . Hyperlipidemia   . Ischemic colitis ([place])   . Migraine   . Thyroid disease   . UTI (lower urinary tract infection)     Family History  Problem Relation Age of Onset  . Drug abuse Sister   . Alcohol abuse Sister   . Depression Sister   . Rheum arthritis Father   . Congestive Heart Failure Father   . Depression Father   . Diabetes Father   . Hearing loss Father   . Osteoarthritis Mother   . Stroke Mother   . Aneurysm Mother   . Breast cancer Maternal Aunt   . Heart attack Maternal Grandfather   . Diabetes Brother   . Alcohol abuse Paternal Uncle   . Arthritis Sister   . Depression Sister   . Heart disease Sister   . Crohn's disease Sister     Social History   Socioeconomic History  . Marital status: Married    Spouse name: Not on file  . Number of children: Not on file  . Years of education: Not on file  . Highest education level: Not on file  Occupational History  . Not on file  Tobacco Use  . Smoking status: Never Smoker  . Smokeless tobacco: Never Used  Vaping Use  . Vaping Use: Never used  Substance and Sexual Activity  . Alcohol use: No  . Drug use: No  . Sexual activity: Yes    Comment: Perimenopausal  Other Topics Concern  . Not on file  Social History Narrative  . Not on file   Social Determinants of Health   Financial Resource Strain: Not  on file  Food Insecurity: Not on file  Transportation Needs: Not on file  Physical Activity: Not on file  Stress: Not on file  Social Connections: Not on file  Intimate Partner Violence: Not on file    Allergies  Allergen Reactions  . Bactrim [Sulfamethoxazole-Trimethoprim] Hives  . Gabapentin Other (See Comments)  . Tramadol Itching     Constitutional: Denies fever, malaise, fatigue, headache or abrupt weight changes.   GU: Pt reports urgency, cloudy urine, vaginal dryness, painful intercourse. Denies frequency, dysuria, burning sensation, blood in urine, odor or discharge. Skin: Denies redness, rashes, lesions or ulcercations.   No other specific complaints in a complete review of systems (except as listed in HPI above).    Objective:   Physical Exam  BP 116/74   Pulse 74   Temp 97.8 F (36.6 C) (Temporal)   Wt 145 lb (65.8 kg)   LMP 12/12/2014 (Approximate)   SpO2 99%   BMI 29.79 kg/m   Wt Readings from Last 3 Encounters:  06/13/20 150 lb (68 kg)  04/11/20 153 lb (69.4 kg)  02/22/20 150 lb (68 kg)    General: Appears her stated age, well developed, well nourished in NAD. Cardiovascular: Normal rate. Pulmonary/Chest:  Normal effort. No respiratory distress. No wheezes, rales or ronchi noted.  Pelvic: Deferred       Assessment & Plan:   Urinary Urgency, Cloudy Urine, Dyspareunia, Vaginal Dryness, Post Menopausal:  Urinalysis: normal RX for Estrace Cream 1 application 3 x week Continue use of KY jelly  Update me in 1 month and let me know how you are doing [name], NP  This visit occurred during the SARS-CoV-2 public health emergency.  Safety protocols were in place, including screening questions prior to the visit, additional usage of staff PPE, and extensive cleaning of exam room while observing appropriate contact time as indicated for disinfecting solutions.

## 2020-10-11 NOTE — Addendum Note (Signed)
Addended by: Lurlean Nanny on: 10/11/2020 05:02 PM   Modules accepted: Orders

## 2020-10-21 NOTE — Progress Notes (Deleted)
PROVIDER NOTE: Information contained herein reflects review and annotations entered in association with encounter. Interpretation of such information and data should be left to medically-trained personnel. Information provided to patient can be located elsewhere in the medical record under "Patient Instructions". Document created using STT-dictation technology, any transcriptional errors that may result from process are unintentional.    Patient: Grace Shepard  Service Category: E/M  Provider: Gaspar Cola, MD  DOB: 04-25-1966  DOS: 10/23/2020  Specialty: Interventional Pain Management  MRN: [id number]  Setting: Ambulatory outpatient  PCP: Grace Fenton, NP  Type: Established Patient    Referring Provider: [name], NP  Location: Office  Delivery: Face-to-face     HPI  [name], a 55 y.o. year old female, is here today because of her No primary diagnosis found.. [name] primary complain today is No chief complaint on file. Last encounter: My last encounter with her was on 09/25/2020. Pertinent problems: [name] has Fibromyalgia; Migraines; Chronic low back pain (1ry area of Pain) (Bilateral) (R>L) w/o sciatica; Chronic pain syndrome; Chronic lower extremity pain (2ry area of Pain) (Bilateral) (R>L); Chronic hip pain (Bilateral) (R>L); Chronic upper extremity pain (Bilateral); Cervicalgia (Right); Chronic occipital headache (Right); Cervicogenic headache (Right); Occipital neuralgia (lesser occipital nerve) (Right); Cervical facet syndrome (Right); Cervico-occipital neuralgia (Right); Chronic sacroiliac joint pain (Bilateral) (R>L); Lumbar facet syndrome (Bilateral) (R>L); Chronic sacroiliac joint pain (Right); Sacroiliac joint dysfunction (Bilateral) (R>L); Trochanteric bursitis of hip (Bilateral); Chronic hip pain (Left); Other spondylosis, sacral and sacrococcygeal region; Enthesopathy of hip region (Left); and Greater trochanteric bursitis of hips (Bilateral) on their  pertinent problem list. Pain Assessment: Severity of   is reported as a  /10. Location:    / . Onset:  . Quality:  . Timing:  . Modifying factor(s):  Marland Kitchen Vitals:  vitals were not taken for this visit.   Reason for encounter: worsening of previously known (established) problem  ***  Last procedure done 02/08/2020: Diagnostic right SI joint injection #1 + diagnostic left IA hip injection #1 + bilateral trochanteric bursa injection #1, under fluoroscopic guidance, no sedation  Pharmacotherapy Assessment   Analgesic: None MME/day: 0 mg/day   Monitoring: [place] PMP: PDMP reviewed during this encounter.       Pharmacotherapy: No side-effects or adverse reactions reported. Compliance: No problems identified. Effectiveness: Clinically acceptable.  No notes on file  UDS: No results found for: SUMMARY   ROS  Constitutional: Denies any fever or chills Gastrointestinal: No reported hemesis, hematochezia, vomiting, or acute GI distress Musculoskeletal: Denies any acute onset joint swelling, redness, loss of ROM, or weakness Neurological: No reported episodes of acute onset apraxia, aphasia, dysarthria, agnosia, amnesia, paralysis, loss of coordination, or loss of consciousness  Medication Review  Vitamin D3, albuterol, baclofen, buPROPion, cetirizine, citalopram, estradiol, levothyroxine, meloxicam, montelukast, pantoprazole, predniSONE, pregabalin, rosuvastatin, topiramate, triamcinolone, vitamin B-12, and zolpidem  History Review  Allergy: [name] is allergic to bactrim [sulfamethoxazole-trimethoprim], gabapentin, and tramadol. Drug: [name]  reports no history of drug use. Alcohol:  reports no history of alcohol use. Tobacco:  reports that she has never smoked. She has never used smokeless tobacco. Social: [name]  reports that she has never smoked. She has never used smokeless tobacco. She reports that she does not drink alcohol and does not use drugs. Medical:  has a past medical  history of Allergic rhinitis, Arthritis, Depression, Fibromyalgia, Granuloma annulare (12/10/2019), Hyperlipidemia, Ischemic colitis ([place]), Migraine, Thyroid disease, and UTI (lower urinary tract infection). Surgical:  [name]  has a past surgical history that includes Tubal ligation. Family: family history includes Alcohol abuse in her paternal uncle and sister; Aneurysm in her mother; Arthritis in her sister; Breast cancer in her maternal aunt; Congestive Heart Failure in her father; Crohn's disease in her sister; Depression in her father, sister, and sister; Diabetes in her brother and father; Drug abuse in her sister; Hearing loss in her father; Heart attack in her maternal grandfather; Heart disease in her sister; Osteoarthritis in her mother; Rheum arthritis in her father; Stroke in her mother.  Laboratory Chemistry Profile   Renal Lab Results  Component Value Date   BUN 10 04/11/2020   CREATININE 0.92 33/82/5053   BCR NOT APPLICABLE 97/67/3419   GFR 64.09 04/11/2020   GFRAA >60 01/17/2020   GFRNONAA 58 (L) 01/17/2020     Hepatic Lab Results  Component Value Date   AST 16 04/11/2020   ALT 18 04/11/2020   ALBUMIN 4.5 04/11/2020   ALKPHOS 106 04/11/2020   LIPASE 24.0 06/15/2015     Electrolytes Lab Results  Component Value Date   NA 143 04/11/2020   K 4.2 04/11/2020   CL 110 04/11/2020   CALCIUM 9.5 04/11/2020   MG 2.2 01/17/2020     Bone Lab Results  Component Value Date   VD25OH 59.05 04/11/2020     Inflammation (CRP: Acute Phase) (ESR: Chronic Phase) Lab Results  Component Value Date   CRP 1.4 (H) 01/17/2020   ESRSEDRATE 9 01/17/2020       Note: Above Lab results reviewed.  Recent Imaging Review  DG PAIN CLINIC C-ARM 1-60 MIN NO REPORT Fluoro was used, but no Radiologist interpretation will be provided.  Please refer to "NOTES" tab for provider progress note. Note: Reviewed        Physical Exam  General appearance: Well nourished, well developed, and  well hydrated. In no apparent acute distress Mental status: Alert, oriented x 3 (person, place, & time)       Respiratory: No evidence of acute respiratory distress Eyes: PERLA Vitals: LMP 05/08/2015 Comment: neg preg BMI: Estimated body mass index is 29.79 kg/m as calculated from the following:   Height as of 04/11/20: 4' 10.5" (1.486 m).   Weight as of 10/11/20: 145 lb (65.8 kg). Ideal: Patient height not recorded  Assessment   Status Diagnosis  Controlled Controlled Controlled No diagnosis found.   Updated Problems: No problems updated.  Plan of Care  Problem-specific:  No problem-specific Assessment & Plan notes found for this encounter.  Ms. SAIYA CRIST has a current medication list which includes the following long-term medication(s): albuterol, bupropion, citalopram, levothyroxine, montelukast, pantoprazole, pregabalin, rosuvastatin, topiramate, and zolpidem.  Pharmacotherapy (Medications Ordered): No orders of the defined types were placed in this encounter.  Orders:  No orders of the defined types were placed in this encounter.  Follow-up plan:   No follow-ups on file.      Interventional management options: Planned, scheduled, and/or pending:    Diagnostic right SI joint block #1 + left IA hip injection #1 + bilateral trochanteric bursa injection #1 under fluoroscopic guidance and IV sedation (Today)   Considering:   Diagnostic bilateral sacroiliac joint block  Diagnostic bilateral trochanteric bursa injections  Diagnostic bilateral IA hip joint injections  Diagnostic right-sided cervical ESI  Diagnostic right-sided occipital nerve blocks  Possible right occipital nerve RFA  Diagnostic right C2/TON nerve block  Possible right C2/TON RFA    PRN Procedures:   None at this  time      Recent Visits No visits were found meeting these conditions. Showing recent visits within past 90 days and meeting all other requirements Today's Visits Date Type Provider  Dept  10/23/20 Appointment Milinda Pointer, MD [facility]  Showing today's visits and meeting all other requirements Future Appointments No visits were found meeting these conditions. Showing future appointments within next 90 days and meeting all other requirements  I discussed the assessment and treatment plan with the patient. The patient was provided an opportunity to ask questions and all were answered. The patient agreed with the plan and demonstrated an understanding of the instructions.  Patient advised to call back or seek an in-person evaluation if the symptoms or condition worsens.  Duration of encounter: *** minutes.  Note by: Gaspar Cola, MD Date: 10/23/2020; Time: 8:09 AM

## 2020-10-23 ENCOUNTER — Ambulatory Visit: Admitting: Pain Medicine

## 2020-10-25 ENCOUNTER — Encounter: Payer: Self-pay | Admitting: Pain Medicine

## 2020-10-25 ENCOUNTER — Other Ambulatory Visit: Payer: Self-pay

## 2020-10-25 ENCOUNTER — Ambulatory Visit: Attending: Pain Medicine | Admitting: Pain Medicine

## 2020-10-25 VITALS — BP 121/77 | HR 88 | Temp 97.2°F | Resp 16 | Ht <= 58 in | Wt 150.0 lb

## 2020-10-25 DIAGNOSIS — M79604 Pain in right leg: Secondary | ICD-10-CM | POA: Diagnosis not present

## 2020-10-25 DIAGNOSIS — M25552 Pain in left hip: Secondary | ICD-10-CM | POA: Diagnosis present

## 2020-10-25 DIAGNOSIS — M7062 Trochanteric bursitis, left hip: Secondary | ICD-10-CM | POA: Diagnosis present

## 2020-10-25 DIAGNOSIS — M545 Low back pain, unspecified: Secondary | ICD-10-CM | POA: Diagnosis not present

## 2020-10-25 DIAGNOSIS — M79605 Pain in left leg: Secondary | ICD-10-CM

## 2020-10-25 DIAGNOSIS — M7061 Trochanteric bursitis, right hip: Secondary | ICD-10-CM

## 2020-10-25 DIAGNOSIS — G894 Chronic pain syndrome: Secondary | ICD-10-CM

## 2020-10-25 DIAGNOSIS — M533 Sacrococcygeal disorders, not elsewhere classified: Secondary | ICD-10-CM

## 2020-10-25 DIAGNOSIS — G8929 Other chronic pain: Secondary | ICD-10-CM

## 2020-10-25 NOTE — Patient Instructions (Signed)
____________________________________________________________________________________________  Preparing for Procedure with Sedation  Procedure appointments are limited to planned procedures: . No Prescription Refills. . No disability issues will be discussed. . No medication changes will be discussed.  Instructions: . Oral Intake: Do not eat or drink anything for at least 8 hours prior to your procedure. (Exception: Blood Pressure Medication. See below.) . Transportation: Unless otherwise stated by your physician, you may drive yourself after the procedure. . Blood Pressure Medicine: Do not forget to take your blood pressure medicine with a sip of water the morning of the procedure. If your Diastolic (lower reading)is above 100 mmHg, elective cases will be cancelled/rescheduled. . Blood thinners: These will need to be stopped for procedures. Notify our staff if you are taking any blood thinners. Depending on which one you take, there will be specific instructions on how and when to stop it. . Diabetics on insulin: Notify the staff so that you can be scheduled 1st case in the morning. If your diabetes requires high dose insulin, take only  of your normal insulin dose the morning of the procedure and notify the staff that you have done so. . Preventing infections: Shower with an antibacterial soap the morning of your procedure. . Build-up your immune system: Take 1000 mg of Vitamin C with every meal (3 times a day) the day prior to your procedure. . Antibiotics: Inform the staff if you have a condition or reason that requires you to take antibiotics before dental procedures. . Pregnancy: If you are pregnant, call and cancel the procedure. . Sickness: If you have a cold, fever, or any active infections, call and cancel the procedure. . Arrival: You must be in the facility at least 30 minutes prior to your scheduled procedure. . Children: Do not bring children with you. . Dress appropriately:  Bring dark clothing that you would not mind if they get stained. . Valuables: Do not bring any jewelry or valuables.  Reasons to call and reschedule or cancel your procedure: (Following these recommendations will minimize the risk of a serious complication.) . Surgeries: Avoid having procedures within 2 weeks of any surgery. (Avoid for 2 weeks before or after any surgery). . Flu Shots: Avoid having procedures within 2 weeks of a flu shots or . (Avoid for 2 weeks before or after immunizations). . Barium: Avoid having a procedure within 7-10 days after having had a radiological study involving the use of radiological contrast. (Myelograms, Barium swallow or enema study). . Heart attacks: Avoid any elective procedures or surgeries for the initial 6 months after a "Myocardial Infarction" (Heart Attack). . Blood thinners: It is imperative that you stop these medications before procedures. Let us know if you if you take any blood thinner.  . Infection: Avoid procedures during or within two weeks of an infection (including chest colds or gastrointestinal problems). Symptoms associated with infections include: Localized redness, fever, chills, night sweats or profuse sweating, burning sensation when voiding, cough, congestion, stuffiness, runny nose, sore throat, diarrhea, nausea, vomiting, cold or Flu symptoms, recent or current infections. It is specially important if the infection is over the area that we intend to treat. . Heart and lung problems: Symptoms that may suggest an active cardiopulmonary problem include: cough, chest pain, breathing difficulties or shortness of breath, dizziness, ankle swelling, uncontrolled high or unusually low blood pressure, and/or palpitations. If you are experiencing any of these symptoms, cancel your procedure and contact your primary care physician for an evaluation.  Remember:  Regular Business hours are:    Monday to Thursday 8:00 AM to 4:00 PM  Provider's  Schedule: Carli Lefevers, MD:  Procedure days: Tuesday and Thursday 7:30 AM to 4:00 PM  Bilal Lateef, MD:  Procedure days: Monday and Wednesday 7:30 AM to 4:00 PM ____________________________________________________________________________________________   ____________________________________________________________________________________________  General Risks and Possible Complications  Patient Responsibilities: It is important that you read this as it is part of your informed consent. It is our duty to inform you of the risks and possible complications associated with treatments offered to you. It is your responsibility as a patient to read this and to ask questions about anything that is not clear or that you believe was not covered in this document.  Patient's Rights: You have the right to refuse treatment. You also have the right to change your mind, even after initially having agreed to have the treatment done. However, under this last option, if you wait until the last second to change your mind, you may be charged for the materials used up to that point.  Introduction: Medicine is not an exact science. Everything in Medicine, including the lack of treatment(s), carries the potential for danger, harm, or loss (which is by definition: Risk). In Medicine, a complication is a secondary problem, condition, or disease that can aggravate an already existing one. All treatments carry the risk of possible complications. The fact that a side effects or complications occurs, does not imply that the treatment was conducted incorrectly. It must be clearly understood that these can happen even when everything is done following the highest safety standards.  No treatment: You can choose not to proceed with the proposed treatment alternative. The "PRO(s)" would include: avoiding the risk of complications associated with the therapy. The "CON(s)" would include: not getting any of the treatment  benefits. These benefits fall under one of three categories: diagnostic; therapeutic; and/or palliative. Diagnostic benefits include: getting information which can ultimately lead to improvement of the disease or symptom(s). Therapeutic benefits are those associated with the successful treatment of the disease. Finally, palliative benefits are those related to the decrease of the primary symptoms, without necessarily curing the condition (example: decreasing the pain from a flare-up of a chronic condition, such as incurable terminal cancer).  General Risks and Complications: These are associated to most interventional treatments. They can occur alone, or in combination. They fall under one of the following six (6) categories: no benefit or worsening of symptoms; bleeding; infection; nerve damage; allergic reactions; and/or death. 1. No benefits or worsening of symptoms: In Medicine there are no guarantees, only probabilities. No healthcare provider can ever guarantee that a medical treatment will work, they can only state the probability that it may. Furthermore, there is always the possibility that the condition may worsen, either directly, or indirectly, as a consequence of the treatment. 2. Bleeding: This is more common if the patient is taking a blood thinner, either prescription or over the counter (example: Goody Powders, Fish oil, Aspirin, Garlic, etc.), or if suffering a condition associated with impaired coagulation (example: Hemophilia, cirrhosis of the liver, low platelet counts, etc.). However, even if you do not have one on these, it can still happen. If you have any of these conditions, or take one of these drugs, make sure to notify your treating physician. 3. Infection: This is more common in patients with a compromised immune system, either due to disease (example: diabetes, cancer, human immunodeficiency virus [HIV], etc.), or due to medications or treatments (example: therapies used to treat  cancer and   rheumatological diseases). However, even if you do not have one on these, it can still happen. If you have any of these conditions, or take one of these drugs, make sure to notify your treating physician. 4. Nerve Damage: This is more common when the treatment is an invasive one, but it can also happen with the use of medications, such as those used in the treatment of cancer. The damage can occur to small secondary nerves, or to large primary ones, such as those in the spinal cord and brain. This damage may be temporary or permanent and it may lead to impairments that can range from temporary numbness to permanent paralysis and/or brain death. 5. Allergic Reactions: Any time a substance or material comes in contact with our body, there is the possibility of an allergic reaction. These can range from a mild skin rash (contact dermatitis) to a severe systemic reaction (anaphylactic reaction), which can result in death. 6. Death: In general, any medical intervention can result in death, most of the time due to an unforeseen complication. ____________________________________________________________________________________________   

## 2020-10-25 NOTE — Progress Notes (Signed)
Safety precautions to be maintained throughout the outpatient stay will include: orient to surroundings, keep bed in low position, maintain call bell within reach at all times, provide assistance with transfer out of bed and ambulation.  

## 2020-10-25 NOTE — Progress Notes (Signed)
PROVIDER NOTE: Information contained herein reflects review and annotations entered in association with encounter. Interpretation of such information and data should be left to medically-trained personnel. Information provided to patient can be located elsewhere in the medical record under "Patient Instructions". Document created using STT-dictation technology, any transcriptional errors that may result from process are unintentional.    Patient: Grace Shepard  Service Category: E/M  Provider: Gaspar Cola, MD  DOB: 18-Aug-1965  DOS: 10/25/2020  Specialty: Interventional Pain Management  MRN: [id number]  Setting: Ambulatory outpatient  PCP: Grace Fenton, NP  Type: Established Patient    Referring Provider: [name], NP  Location: Office  Delivery: Face-to-face     HPI  [name], a 55 y.o. year old female, is here today because of her Chronic pain syndrome [G89.4]. [name] primary complain today is Back Pain (low) Last encounter: My last encounter with her was on 10/23/2020. Pertinent problems: [name] has Fibromyalgia; Migraines; Chronic low back pain (1ry area of Pain) (Bilateral) (R>L) w/o sciatica; Chronic pain syndrome; Chronic lower extremity pain (2ry area of Pain) (Bilateral) (R>L); Chronic hip pain (Bilateral) (R>L); Chronic upper extremity pain (Bilateral); Cervicalgia (Right); Chronic occipital headache (Right); Cervicogenic headache (Right); Occipital neuralgia (lesser occipital nerve) (Right); Cervical facet syndrome (Right); Cervico-occipital neuralgia (Right); Chronic sacroiliac joint pain (Bilateral) (R>L); Lumbar facet syndrome (Bilateral) (R>L); Chronic sacroiliac joint pain (Right); Sacroiliac joint dysfunction (Bilateral) (R>L); Trochanteric bursitis of hip (Bilateral); Chronic hip pain (Left); Other spondylosis, sacral and sacrococcygeal region; Enthesopathy of hip region (Left); and Greater trochanteric bursitis of hips (Bilateral) on their pertinent  problem list. Pain Assessment: Severity of Chronic pain is reported as a 2 /10. Location: Back Lower/denies. Onset: More than a month ago. Quality: Aching. Timing: Intermittent. Modifying factor(s): heat. Vitals:  height is _0  (1.473 m) and weight is 150 lb (68 kg). Her temporal temperature is 97.2 F (36.2 C) (abnormal). Her blood pressure is 121/77 and her pulse is 88. Her respiration is 16 and oxygen saturation is 99%.   Reason for encounter: worsening of previously known (established) problem.  The patient indicates that she is having a recurrence of her right low back pain in the area of the SI joint and the left hip area.  She indicates that the procedure that we did on 02/08/2020 provided her with excellent relief of the pain until about November when some twitches of the pain began to show up again.  She was extremely happy with the results of those injections and she would like to come in and have it repeated.  Today she has requested to have it done with some sedation.  Pharmacotherapy Assessment   Analgesic: None. No opioid analgesics prescribed by our practice. MME/day: 0 mg/day   Monitoring: [place] PMP: PDMP reviewed during this encounter.       Pharmacotherapy: No side-effects or adverse reactions reported. Compliance: No problems identified. Effectiveness: Clinically acceptable.  Grace Shorter, RN  10/25/2020 12:34 PM  Signed Safety precautions to be maintained throughout the outpatient stay will include: orient to surroundings, keep bed in low position, maintain call bell within reach at all times, provide assistance with transfer out of bed and ambulation.    UDS: No results found for: SUMMARY   ROS  Constitutional: Denies any fever or chills Gastrointestinal: No reported hemesis, hematochezia, vomiting, or acute GI distress Musculoskeletal: Denies any acute onset joint swelling, redness, loss of ROM, or weakness Neurological: No reported episodes of acute onset apraxia,  aphasia, dysarthria, agnosia, amnesia, paralysis, loss of coordination, or loss of consciousness  Medication Review  Vitamin D3, albuterol, baclofen, buPROPion, cetirizine, citalopram, estradiol, levothyroxine, montelukast, pantoprazole, pregabalin, rosuvastatin, topiramate, triamcinolone, vitamin B-12, and zolpidem  History Review  Allergy: [name] is allergic to bactrim [sulfamethoxazole-trimethoprim], gabapentin, and tramadol. Drug: [name]  reports no history of drug use. Alcohol:  reports no history of alcohol use. Tobacco:  reports that she has never smoked. She has never used smokeless tobacco. Social: [name]  reports that she has never smoked. She has never used smokeless tobacco. She reports that she does not drink alcohol and does not use drugs. 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 Surgical: [name]  has a past surgical history that includes Tubal ligation. Family: family history includes Alcohol abuse in her paternal uncle and sister; Aneurysm in her mother; Arthritis in her sister; Breast cancer in her maternal aunt; Congestive Heart Failure in her father; Crohn's disease in her sister; Depression in her father, sister, and sister; Diabetes in her brother and father; Drug abuse in her sister; Hearing loss in her father; Heart attack in her maternal grandfather; Heart disease in her sister; Osteoarthritis in her mother; Rheum arthritis in her father; Stroke in her mother.  Laboratory Chemistry Profile   Renal Lab Results  Component Value Date   BUN 10 04/11/2020   CREATININE 0.92 01/00/7121   BCR NOT APPLICABLE 97/58/8325   GFR 64.09 04/11/2020   GFRAA >60 01/17/2020   GFRNONAA 58 (L) 01/17/2020     Hepatic Lab Results  Component Value Date   AST 16 04/11/2020   ALT  18 04/11/2020   ALBUMIN 4.5 04/11/2020   ALKPHOS 106 04/11/2020   LIPASE 24.0 06/15/2015     Electrolytes Lab Results  Component Value Date   NA 143 04/11/2020   K 4.2 04/11/2020   CL 110 04/11/2020   CALCIUM 9.5 04/11/2020   MG 2.2 01/17/2020     Bone Lab Results  Component Value Date   VD25OH 59.05 04/11/2020     Inflammation (CRP: Acute Phase) (ESR: Chronic Phase) Lab Results  Component Value Date   CRP 1.4 (H) 01/17/2020   ESRSEDRATE 9 01/17/2020       Note: Above Lab results reviewed.  Recent Imaging Review  DG PAIN CLINIC C-ARM 1-60 MIN NO REPORT Fluoro was used, but no Radiologist interpretation will be provided.  Please refer to "NOTES" tab for provider progress note. Note: Reviewed        Physical Exam  General appearance: Well nourished, well developed, and well hydrated. In no apparent acute distress Mental status: Alert, oriented x 3 (person, place, & time)       Respiratory: No evidence of acute respiratory distress Eyes: PERLA Vitals: BP 121/77 (BP Location: Left Arm, Patient Position: Sitting, Cuff Size: Large)   Pulse 88   Temp (!) 97.2 F (36.2 C) (Temporal)   Resp 16   Ht _0  (1.473 m)   Wt 150 lb (68 kg)   LMP 05/08/2015 Comment: neg preg  SpO2 99%   BMI 31.35 kg/m  BMI: Estimated body mass index is 31.35 kg/m as calculated from the following:   Height as of this encounter: _1  (1.473 m).   Weight as of this encounter: 150 lb (68 kg). Ideal: Patient must be at least 60 in tall to calculate  ideal body weight  Assessment   Status Diagnosis  Controlled Controlled Controlled 1. Chronic pain syndrome   2. Chronic low back pain (1ry area of Pain) (Bilateral) (R>L) w/o sciatica   3. Chronic lower extremity pain (2ry area of Pain) (Bilateral) (R>L)   4. Chronic sacroiliac joint pain (Right)   5. Chronic hip pain (Left)   6. Greater trochanteric bursitis of hips (Bilateral)      Updated Problems: No problems updated.  Plan of  Care  Problem-specific:  No problem-specific Assessment & Plan notes found for this encounter.  Ms. BERGEN MELLE has a current medication list which includes the following long-term medication(s): albuterol, bupropion, citalopram, levothyroxine, montelukast, pantoprazole, pregabalin, rosuvastatin, topiramate, and zolpidem.  Pharmacotherapy (Medications Ordered): No orders of the defined types were placed in this encounter.  Orders:  Orders Placed This Encounter  Procedures  . SACROILIAC JOINT INJECTION    Standing Status:   Future    Standing Expiration Date:   11/25/2020    Scheduling Instructions:     Side: Right-sided     Sedation: Patient's choice.     Timeframe: ASAP    Order Specific Question:   Where will this procedure be performed?    Answer:   [facility]  . HIP INJECTION    Standing Status:   Future    Standing Expiration Date:   01/25/2021    Scheduling Instructions:     Side: Left-sided     Sedation: Patient's choice.     Timeframe: As soon as schedule allows  . HIP INJECTION    Standing Status:   Future    Standing Expiration Date:   01/25/2021    Scheduling Instructions:     Purpose: Therapeutic     Indication: Hip pain 2ry to Trochanteric Burlitis bilateral (M70.61, M70.62).     Side: left     Sedation: Patient's choice.     Timeframe: As soon as the schedule permits.   Follow-up plan:   Return for Procedure (w/ sedation): (R) SI Blk #2 + (L) IA Hip/Bursa inj. #2.      Interventional Therapies  Risk  Complexity Considerations:   WNL   Planned  Pending:   Pending further evaluation   Under consideration:   Diagnostic bilateral sacroiliac joint block  Diagnostic bilateral trochanteric bursa injections  Diagnostic bilateral IA hip joint injections  Diagnostic right-sided cervical ESI  Diagnostic right-sided occipital nerve blocks  Possible right occipital nerve RFA  Diagnostic right C2/TON nerve block  Possible right C2/TON RFA     Completed:   Diagnostic right SI joint block #1 + left IA hip injection #1 + bilateral trochanteric bursa injection #1 (02/08/2020) (100/100/90/90-100)   Therapeutic  Palliative (PRN) options:   None established    Recent Visits Date Type Provider Dept  10/23/20 Appointment Milinda Pointer, MD [facility]  Showing recent visits within past 90 days and meeting all other requirements Today's Visits Date Type Provider Dept  10/25/20 Office Visit Milinda Pointer, MD [facility]  Showing today's visits and meeting all other requirements Future Appointments No visits were found meeting these conditions. Showing future appointments within next 90 days and meeting all other requirements  I discussed the assessment and treatment plan with the patient. The patient was provided an opportunity to ask questions and all were answered. The patient agreed with the plan and demonstrated an understanding of the instructions.  Patient advised to call back or seek an in-person evaluation if the symptoms  or condition worsens.  Duration of encounter: 30 minutes.  Note by: Grace Cola, MD Date: 10/25/2020; Time: 12:58 PM

## 2020-11-07 ENCOUNTER — Encounter: Payer: Self-pay | Admitting: Internal Medicine

## 2020-11-08 ENCOUNTER — Other Ambulatory Visit: Payer: Self-pay

## 2020-11-08 MED ORDER — ZOLPIDEM TARTRATE ER 6.25 MG PO TBCR: 6.2500 mg | EXTENDED_RELEASE_TABLET | Freq: Every evening | ORAL | 0 refills | Status: DC | PRN

## 2020-11-08 NOTE — Telephone Encounter (Signed)
Last filled 08/01/2020 #90 to mail order...Marland Kitchen please advise

## 2020-11-21 ENCOUNTER — Other Ambulatory Visit: Payer: Self-pay

## 2020-11-21 ENCOUNTER — Encounter: Payer: Self-pay | Admitting: Pain Medicine

## 2020-11-21 ENCOUNTER — Ambulatory Visit (HOSPITAL_BASED_OUTPATIENT_CLINIC_OR_DEPARTMENT_OTHER): Admitting: Pain Medicine

## 2020-11-21 ENCOUNTER — Ambulatory Visit
Admission: RE | Admit: 2020-11-21 | Discharge: 2020-11-21 | Disposition: A | Discharge status: Home / Self Care | Source: Ambulatory Visit | Attending: Pain Medicine | Admitting: Pain Medicine

## 2020-11-21 ENCOUNTER — Other Ambulatory Visit: Payer: Self-pay | Admitting: Internal Medicine

## 2020-11-21 VITALS — BP 122/78 | HR 81 | Temp 97.3°F | Resp 16 | Ht 59.0 in | Wt 145.0 lb

## 2020-11-21 DIAGNOSIS — M25552 Pain in left hip: Secondary | ICD-10-CM | POA: Diagnosis not present

## 2020-11-21 DIAGNOSIS — M533 Sacrococcygeal disorders, not elsewhere classified: Secondary | ICD-10-CM | POA: Diagnosis not present

## 2020-11-21 DIAGNOSIS — M7061 Trochanteric bursitis, right hip: Secondary | ICD-10-CM

## 2020-11-21 DIAGNOSIS — M7062 Trochanteric bursitis, left hip: Secondary | ICD-10-CM

## 2020-11-21 DIAGNOSIS — G8929 Other chronic pain: Secondary | ICD-10-CM | POA: Insufficient documentation

## 2020-11-21 DIAGNOSIS — M47898 Other spondylosis, sacral and sacrococcygeal region: Secondary | ICD-10-CM | POA: Diagnosis not present

## 2020-11-21 DIAGNOSIS — M76892 Other specified enthesopathies of left lower limb, excluding foot: Secondary | ICD-10-CM

## 2020-11-21 MED ORDER — IOHEXOL 180 MG/ML  SOLN
10.0000 mL | Freq: Once | INTRAMUSCULAR | Status: AC
  Administered 2020-11-21: 10 mL via INTRA_ARTICULAR

## 2020-11-21 MED ORDER — METHYLPREDNISOLONE ACETATE 80 MG/ML IJ SUSP
80.0000 mg | Freq: Once | INTRAMUSCULAR | Status: AC
  Administered 2020-11-21: 80 mg via INTRA_ARTICULAR
  Filled 2020-11-21: qty 1

## 2020-11-21 MED ORDER — METHYLPREDNISOLONE ACETATE 80 MG/ML IJ SUSP
80.0000 mg | Freq: Once | INTRAMUSCULAR | Status: AC
Start: 2020-11-21 — End: 2020-11-21
  Administered 2020-11-21: 80 mg via INTRA_ARTICULAR
  Filled 2020-11-21: qty 1

## 2020-11-21 MED ORDER — ROPIVACAINE HCL 2 MG/ML IJ SOLN
9.0000 mL | Freq: Once | INTRAMUSCULAR | Status: AC
Start: 2020-11-21 — End: 2020-11-21
  Administered 2020-11-21: 9 mL via INTRA_ARTICULAR
  Filled 2020-11-21: qty 10

## 2020-11-21 MED ORDER — LIDOCAINE HCL 2 % IJ SOLN
20.0000 mL | Freq: Once | INTRAMUSCULAR | Status: AC
Start: 2020-11-21 — End: 2020-11-21
  Administered 2020-11-21: 400 mg
  Filled 2020-11-21: qty 40

## 2020-11-21 MED ORDER — LACTATED RINGERS IV SOLN
1000.0000 mL | Freq: Once | INTRAVENOUS | Status: AC
  Administered 2020-11-21: 1000 mL via INTRAVENOUS

## 2020-11-21 MED ORDER — ROPIVACAINE HCL 2 MG/ML IJ SOLN
9.0000 mL | Freq: Once | INTRAMUSCULAR | Status: AC
  Administered 2020-11-21: 9 mL via INTRA_ARTICULAR
  Filled 2020-11-21: qty 10

## 2020-11-21 MED ORDER — MIDAZOLAM HCL 5 MG/5ML IJ SOLN
1.0000 mg | INTRAMUSCULAR | Status: DC | PRN
Start: 2020-11-21 — End: 2020-11-21
  Administered 2020-11-21: 1 mg via INTRAVENOUS
  Filled 2020-11-21: qty 5

## 2020-11-21 MED ORDER — FENTANYL CITRATE (PF) 100 MCG/2ML IJ SOLN
25.0000 ug | INTRAMUSCULAR | Status: DC | PRN
  Administered 2020-11-21: 50 ug via INTRAVENOUS
  Filled 2020-11-21: qty 2

## 2020-11-21 NOTE — Patient Instructions (Signed)

## 2020-11-21 NOTE — Progress Notes (Signed)
Safety precautions to be maintained throughout the outpatient stay will include: orient to surroundings, keep bed in low position, maintain call bell within reach at all times, provide assistance with transfer out of bed and ambulation.  

## 2020-11-21 NOTE — Progress Notes (Signed)
PROVIDER NOTE: Information contained herein reflects review and annotations entered in association with encounter. Interpretation of such information and data should be left to medically-trained personnel. Information provided to patient can be located elsewhere in the medical record under "Patient Instructions". Document created using STT-dictation technology, any transcriptional errors that may result from process are unintentional.    Patient: Grace Shepard  Service Category: Procedure  Provider: Gaspar Cola, MD  DOB: [birth year]  DOS: 11/21/2020  Location: [facility]  MRN: [id number]  Setting: Ambulatory - outpatient  Referring Provider: Milinda Pointer, MD  Type: Established Patient  Specialty: Interventional Pain Management  PCP: [name], NP   Primary Reason for Visit: Interventional Pain Management Treatment. CC: Hip Pain  Procedure #1:  Anesthesia, Analgesia, Anxiolysis:  Type: Diagnostic Sacroiliac Joint Steroid Injection #2  Region: Superior Lumbosacral Region Level: PSIS (Posterior Superior Iliac Spine) Laterality: Right  Type: Moderate (Conscious) Sedation combined with Local Anesthesia Indication(s): Analgesia and Anxiety Route: Intravenous (IV) IV Access: Secured Sedation: Meaningful verbal contact was maintained at all times during the procedure  Local Anesthetic: Lidocaine 1-2%  Position: Prone           Indications: 1. Chronic sacroiliac joint pain (Right)   2. 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 Other spondylosis, sacral and sacrococcygeal region    Procedure #2:    Type: Intra-Articular Hip Injection #2  Primary Purpose: Diagnostic Region: Posterolateral hip joint area. Level: Lower pelvic and hip joint level. Target Area: Superior aspect of the hip joint cavity, going thru the superior portion of the capsular ligament. Approach: Posterolateral approach. Laterality: Left     Procedure #3:    Type: Trochanteric  Bursa Injection #2  Primary Purpose: Diagnostic Region: Upper (proximal) Femoral Region Level: Hip Joint Target Area: Superior aspect of the hip joint cavity, going thru the superior portion of the capsular ligament. Approach: Posterolateral approach Laterality: Left     Indications: 1. Chronic hip pain (Left)   2. Enthesopathy of hip region (Left)   3. Trochanteric bursitis of hip (Bilateral)   4. Greater trochanteric bursitis of hips (Bilateral)    Pain Score: Pre-procedure: 2 /10 Post-procedure: 0-No pain/10   Pre-op H&P Assessment:  Grace Shepard is a 55 y.o. (year old), female patient, seen today for interventional treatment. She  has a past surgical history that includes Tubal ligation. Grace Shepard has a current medication list which includes the following prescription(s): albuterol, baclofen, bupropion, cetirizine, vitamin d3, citalopram, estradiol, levothyroxine, montelukast, pantoprazole, pregabalin, rosuvastatin, topiramate, triamcinolone cream, vitamin b-12, and zolpidem, and the following Facility-Administered Medications: fentanyl and midazolam. Her primarily concern today is the Hip Pain  Initial Vital Signs:  Pulse/HCG Rate: 81ECG Heart Rate: 79 Temp: (!) 97.3 F (36.3 C) Resp: 16 BP: 117/80 SpO2: 100 %  BMI: Estimated body mass index is 29.29 kg/m as calculated from the following:   Height as of this encounter: 4\' 11"  (1.499 m).   Weight as of this encounter: 145 lb (65.8 kg).  Risk Assessment: Allergies: Reviewed. She is allergic to bactrim [sulfamethoxazole-trimethoprim], gabapentin, and tramadol.  Allergy Precautions: None required Coagulopathies: Reviewed. None identified.  Blood-thinner therapy: None at this time Active Infection(s): Reviewed. None identified. Grace Shepard is afebrile  Site Confirmation: Grace Shepard was asked to confirm the procedure and laterality before marking the site Procedure checklist: Completed Consent: Before the procedure and under  the influence of no sedative(s), amnesic(s), or anxiolytics, the patient was informed of the treatment options, risks and  possible complications. To fulfill our ethical and legal obligations, as recommended by the American Medical Association's Code of Ethics, I have informed the patient of my clinical impression; the nature and purpose of the treatment or procedure; the risks, benefits, and possible complications of the intervention; the alternatives, including doing nothing; the risk(s) and benefit(s) of the alternative treatment(s) or procedure(s); and the risk(s) and benefit(s) of doing nothing. The patient was provided information about the general risks and possible complications associated with the procedure. These may include, but are not limited to: failure to achieve desired goals, infection, bleeding, organ or nerve damage, allergic reactions, paralysis, and death. In addition, the patient was informed of those risks and complications associated to the procedure, such as failure to decrease pain; infection; bleeding; organ or nerve damage with subsequent damage to sensory, motor, and/or autonomic systems, resulting in permanent pain, numbness, and/or weakness of one or several areas of the body; allergic reactions; (i.e.: anaphylactic reaction); and/or death. Furthermore, the patient was informed of those risks and complications associated with the medications. These include, but are not limited to: allergic reactions (i.e.: anaphylactic or anaphylactoid reaction(s)); adrenal axis suppression; blood sugar elevation that in diabetics may result in ketoacidosis or comma; water retention that in patients with history of congestive heart failure may result in shortness of breath, pulmonary edema, and decompensation with resultant heart failure; weight gain; swelling or edema; medication-induced neural toxicity; particulate matter embolism and blood vessel occlusion with resultant organ, and/or nervous  system infarction; and/or aseptic necrosis of one or more joints. Finally, the patient was informed that Medicine is not an exact science; therefore, there is also the possibility of unforeseen or unpredictable risks and/or possible complications that may result in a catastrophic outcome. The patient indicated having understood very clearly. We have given the patient no guarantees and we have made no promises. Enough time was given to the patient to ask questions, all of which were answered to the patient's satisfaction. Ms. Buxbaum has indicated that she wanted to continue with the procedure. Attestation: I, the ordering provider, attest that I have discussed with the patient the benefits, risks, side-effects, alternatives, likelihood of achieving goals, and potential problems during recovery for the procedure that I have provided informed consent. Date  Time: 11/21/2020  9:41 AM  Pre-Procedure Preparation:  Monitoring: As per clinic protocol. Respiration, ETCO2, SpO2, BP, heart rate and rhythm monitor placed and checked for adequate function Safety Precautions: Patient was assessed for positional comfort and pressure points before starting the procedure. Time-out: I initiated and conducted the "Time-out" before starting the procedure, as per protocol. The patient was asked to participate by confirming the accuracy of the "Time Out" information. Verification of the correct person, site, and procedure were performed and confirmed by me, the nursing staff, and the patient. "Time-out" conducted as per Joint Commission's Universal Protocol (UP.01.01.01). Time: 1015  Description of Procedure #1:  Target Area: Superior, posterior, aspect of the sacroiliac fissure Approach: Posterior, paraspinal, ipsilateral approach. Area Prepped: Entire Lower Lumbosacral Region DuraPrep (Iodine Povacrylex [0.7% available iodine] and Isopropyl Alcohol, 74% w/w) Safety Precautions: Aspiration looking for blood return was  conducted prior to all injections. At no point did we inject any substances, as a needle was being advanced. No attempts were made at seeking any paresthesias. Safe injection practices and needle disposal techniques used. Medications properly checked for expiration dates. SDV (single dose vial) medications used. Description of the Procedure: Protocol guidelines were followed. The patient was placed in position over  the procedure table. The target area was identified and the area prepped in the usual manner. Skin & deeper tissues infiltrated with local anesthetic. Appropriate amount of time allowed to pass for local anesthetics to take effect. The procedure needle was advanced under fluoroscopic guidance into the sacroiliac joint until a firm endpoint was obtained. Proper needle placement secured. Negative aspiration confirmed. Solution injected in intermittent fashion, asking for systemic symptoms every 0.5cc of injectate. The needles were then removed and the area cleansed, making sure to leave some of the prepping solution back to take advantage of its long term bactericidal properties. Vitals:   11/21/20 1025 11/21/20 1035 11/21/20 1045 11/21/20 1055  BP: 116/90 111/74 112/76 122/78  Pulse:      Resp: 16 11 13 16   Temp:  (!) 97.4 F (36.3 C)  (!) 97.3 F (36.3 C)  SpO2: 100% 97% 99% 98%  Weight:      Height:        Start Time: 1015 hrs. End Time: 1025 hrs. Materials:  Needle(s) Type: Spinal Needle Gauge: 22G Length: 5-in Medication(s): Please see orders for medications and dosing details.  Imaging Guidance (Non-Spinal) for procedure #1:  Type of Imaging Technique: Fluoroscopy Guidance (Non-Spinal) Indication(s): Assistance in needle guidance and placement for procedures requiring needle placement in or near specific anatomical locations not easily accessible without such assistance. Exposure Time: Please see nurses notes. Contrast: None used. Fluoroscopic Guidance: I was personally  present during the use of fluoroscopy. "Tunnel Vision Technique" used to obtain the best possible view of the target area. Parallax error corrected before commencing the procedure. "Direction-depth-direction" technique used to introduce the needle under continuous pulsed fluoroscopy. Once target was reached, antero-posterior, oblique, and lateral fluoroscopic projection used confirm needle placement in all planes. Images permanently stored in EMR. Interpretation: No contrast injected.  Description of Procedure #2:  Safety Precautions: Aspiration looking for blood return was conducted prior to all injections. At no point did we inject any substances, as a needle was being advanced. No attempts were made at seeking any paresthesias. Safe injection practices and needle disposal techniques used. Medications properly checked for expiration dates. SDV (single dose vial) medications used. Description of the Procedure: Protocol guidelines were followed. The patient was placed in position over the fluoroscopy table. The target area was identified and the area prepped in the usual manner. Skin & deeper tissues infiltrated with local anesthetic. Appropriate amount of time allowed to pass for local anesthetics to take effect. The procedure needles were then advanced to the target area. Proper needle placement secured. Negative aspiration confirmed. Solution injected in intermittent fashion, asking for systemic symptoms every 0.5cc of injectate. The needles were then removed and the area cleansed, making sure to leave some of the prepping solution back to take advantage of its long term bactericidal properties.  Start Time: 1015 hrs. Materials:  Needle(s) Type: Spinal Needle Gauge: 22G Length: 7-in Medication(s): Please see orders for medications and dosing details.  Imaging Guidance (Non-Spinal) for procedure #2:  Type of Imaging Technique: Fluoroscopy Guidance (Non-Spinal) Indication(s): Assistance in needle  guidance and placement for procedures requiring needle placement in or near specific anatomical locations not easily accessible without such assistance. Exposure Time: Please see nurses notes. Contrast: None used. Fluoroscopic Guidance: I was personally present during the use of fluoroscopy. "Tunnel Vision Technique" used to obtain the best possible view of the target area. Parallax error corrected before commencing the procedure. "Direction-depth-direction" technique used to introduce the needle under continuous pulsed fluoroscopy. Once  target was reached, antero-posterior, oblique, and lateral fluoroscopic projection used confirm needle placement in all planes. Images permanently stored in EMR. Interpretation: No contrast injected.   Description of Procedure #3:  Description of the Procedure: Skin & deeper tissues infiltrated with local anesthetic. Appropriate amount of time allowed to pass for local anesthetics to take effect. The procedure needles were then advanced to the target area. Proper needle placement secured. Negative aspiration confirmed. Solution injected in intermittent fashion, asking for systemic symptoms every 0.5cc of injectate. The needles were then removed and the area cleansed, making sure to leave some of the prepping solution back to take advantage of its long term bactericidal properties.  Vitals:   11/21/20 1025 11/21/20 1035 11/21/20 1045 11/21/20 1055  BP: 116/90 111/74 112/76 122/78  Pulse:      Resp: 16 11 13 16   Temp:  (!) 97.4 F (36.3 C)  (!) 97.3 F (36.3 C)  SpO2: 100% 97% 99% 98%  Weight:      Height:         End Time: 1025 hrs.      Materials:  Needle(s) Type: Spinal Needle Gauge: 22G Length: 7-in Medication(s): Please see orders for medications and dosing details.  Imaging Guidance (Non-Spinal) for procedure #3:  Type of Imaging Technique: Fluoroscopy Guidance (Non-Spinal) Indication(s): Assistance in needle guidance and placement for  procedures requiring needle placement in or near specific anatomical locations not easily accessible without such assistance. Exposure Time: Please see nurses notes. Contrast: Before injecting any contrast, we confirmed that the patient did not have an allergy to iodine, shellfish, or radiological contrast. Once satisfactory needle placement was completed at the desired level, radiological contrast was injected. Contrast injected under live fluoroscopy. No contrast complications. See chart for type and volume of contrast used. Fluoroscopic Guidance: I was personally present during the use of fluoroscopy. "Tunnel Vision Technique" used to obtain the best possible view of the target area. Parallax error corrected before commencing the procedure. "Direction-depth-direction" technique used to introduce the needle under continuous pulsed fluoroscopy. Once target was reached, antero-posterior, oblique, and lateral fluoroscopic projection used confirm needle placement in all planes. Images permanently stored in EMR. Interpretation: I personally interpreted the imaging intraoperatively. Adequate needle placement confirmed in multiple planes. Appropriate spread of contrast into desired area was observed. No evidence of afferent or efferent intravascular uptake. Permanent images saved into the patient's record.  Antibiotic Prophylaxis:   Anti-infectives (From admission, onward)   None     Indication(s): None identified  Post-operative Assessment:  Post-procedure Vital Signs:  Pulse/HCG Rate: 8187 Temp: (!) 97.3 F (36.3 C) Resp: 16 BP: 122/78 SpO2: 98 %  EBL: None  Complications: No immediate post-treatment complications observed by team, or reported by patient.  Note: The patient tolerated the entire procedure well. A repeat set of vitals were taken after the procedure and the patient was kept under observation following institutional policy, for this type of procedure. Post-procedural neurological  assessment was performed, showing return to baseline, prior to discharge. The patient was provided with post-procedure discharge instructions, including a section on how to identify potential problems. Should any problems arise concerning this procedure, the patient was given instructions to immediately contact us, at any time, without hesitation. In any case, we plan to contact the patient by telephone for a follow-up status report regarding this interventional procedure.  Comments:  No additional relevant information.  Plan of Care  Orders:  Orders Placed This Encounter  Procedures  . SACROILIAC JOINT INJECTION  Scheduling Instructions:     Side: Right-sided     Sedation: Patient's choice.     Timeframe: Today    Order Specific Question:   Where will this procedure be performed?    Answer:   [facility]  . HIP INJECTION    Scheduling Instructions:     Side: Left-sided     Sedation: Patient's choice.     Timeframe: Today  . HIP INJECTION    Purpose: Therapeutic/Diagnostic Indication: Hip pain 2ry to Trochanteric Burlitis left (M70.62).    Scheduling Instructions:     Procedure: Trochanteric bursa injection     Laterality: Left-sided     Sedation: Patient's choice.     Timeframe: Today  . DG PA[facility] C-ARM 1-60 MIN NO REPORT    Intraoperative interpretation by procedural physician at [facility].    Standing Status:   Standing    Number of Occurrences:   1    Order Specific Question:   Reason for exam:    Answer:   Assistance in needle guidance and placement for procedures requiring needle placement in or near specific anatomical locations not easily accessible without such assistance.  . Informed Consent Details: Physician/Practitioner Attestation; Transcribe to consent form and obtain patient signature    Nursing Order: Transcribe to consent form and obtain patient signature. Note: Always confirm laterality of pain with Ms. Earleen Newport, before procedure.     Order Specific Question:   Physician/Practitioner attestation of informed consent for procedure/surgical case    Answer:   I, the physician/practitioner, attest that I have discussed with the patient the benefits, risks, side effects, alternatives, likelihood of achieving goals and potential problems during recovery for the procedure that I have provided informed consent.    Order Specific Question:   Procedure    Answer:   Sacroiliac Joint Block    Order Specific Question:   Physician/Practitioner performing the procedure    Answer:   Charrie Mcconnon A. Dossie Arbour, MD    Order Specific Question:   Indication/Reason    Answer:   Chronic Low Back and Hip Pain secondary to Sacroiliac Joint Pain (Arthralgia/Arthropathy)  . Informed Consent Details: Physician/Practitioner Attestation; Transcribe to consent form and obtain patient signature    Nursing Order: Transcribe to consent form and obtain patient signature. Note: Always confirm laterality of pain with Ms. Earleen Newport, before procedure.    Order Specific Question:   Physician/Practitioner attestation of informed consent for procedure/surgical case    Answer:   I, the physician/practitioner, attest that I have discussed with the patient the benefits, risks, side effects, alternatives, likelihood of achieving goals and potential problems during recovery for the procedure that I have provided informed consent.    Order Specific Question:   Procedure    Answer:   Hip injection    Order Specific Question:   Physician/Practitioner performing the procedure    Answer:   Bernhard Koskinen A. Dossie Arbour, MD    Order Specific Question:   Indication/Reason    Answer:   Hip Joint Pain (Arthralgia)  . Informed Consent Details: Physician/Practitioner Attestation; Transcribe to consent form and obtain patient signature    Note: Always confirm laterality of pain with Ms. Earleen Newport, before procedure. Transcribe to consent form and obtain patient signature.    Order Specific Question:    Physician/Practitioner attestation of informed consent for procedure/surgical case    Answer:   I, the physician/practitioner, attest that I have discussed with the patient the benefits, risks, side effects, alternatives, likelihood of achieving goals  and potential problems during recovery for the procedure that I have provided informed consent.    Order Specific Question:   Procedure    Answer:   Hip bursa injection    Order Specific Question:   Physician/Practitioner performing the procedure    Answer:   Kalem Rockwell A. Dossie Arbour, MD    Order Specific Question:   Indication/Reason    Answer:   Hip bursitis  . Provide equipment / supplies at bedside    "Block Tray" (Disposable  single use) Needle type: SpinalSpinal Amount/quantity: 2 Size: Long (7-inch) and Medium (5-inch) Gauge: 22G    Standing Status:   Standing    Number of Occurrences:   1    Order Specific Question:   Specify    Answer:   Block Tray   Chronic Opioid Analgesic:  None. No opioid analgesics prescribed by our practice. MME/day: 0 mg/day   Medications ordered for procedure: Meds ordered this encounter  Medications  . iohexol (OMNIPAQUE) 180 MG/ML injection 10 mL    Must be Myelogram-compatible. If not available, you may substitute with a water-soluble, non-ionic, hypoallergenic, myelogram-compatible radiological contrast medium.  Marland Kitchen lidocaine (XYLOCAINE) 2 % (with pres) injection 400 mg  . lactated ringers infusion 1,000 mL  . midazolam (VERSED) 5 MG/5ML injection 1-2 mg    Make sure Flumazenil is available in the pyxis when using this medication. If oversedation occurs, administer 0.2 mg IV over 15 sec. If after 45 sec no response, administer 0.2 mg again over 1 min; may repeat at 1 min intervals; not to exceed 4 doses (1 mg)  . fentaNYL (SUBLIMAZE) injection 25-50 mcg    Make sure Narcan is available in the pyxis when using this medication. In the event of respiratory depression (RR< 8/min): Titrate NARCAN (naloxone)  in increments of 0.1 to 0.2 mg IV at 2-3 minute intervals, until desired degree of reversal.  . ropivacaine (PF) 2 mg/mL (0.2%) (NAROPIN) injection 9 mL  . methylPREDNISolone acetate (DEPO-MEDROL) injection 80 mg  . ropivacaine (PF) 2 mg/mL (0.2%) (NAROPIN) injection 9 mL  . methylPREDNISolone acetate (DEPO-MEDROL) injection 80 mg   Medications administered: We administered iohexol, lidocaine, lactated ringers, midazolam, fentaNYL, ropivacaine (PF) 2 mg/mL (0.2%), methylPREDNISolone acetate, ropivacaine (PF) 2 mg/mL (0.2%), and methylPREDNISolone acetate.  See the medical record for exact dosing, route, and time of administration.  Follow-up plan:   Return in about 2 weeks (around 12/05/2020) for (VV), (PPE).       Interventional Therapies  Risk  Complexity Considerations:   WNL   Planned  Pending:   Pending further evaluation   Under consideration:   Diagnostic bilateral sacroiliac joint block  Diagnostic bilateral trochanteric bursa injections  Diagnostic bilateral IA hip joint injections  Diagnostic right-sided cervical ESI  Diagnostic right-sided occipital nerve blocks  Possible right occipital nerve RFA  Diagnostic right C2/TON nerve block  Possible right C2/TON RFA    Completed:   Diagnostic right SI joint block #1 + left IA hip injection #1 + bilateral trochanteric bursa injection #1 (02/08/2020) (100/100/90/90-100)   Therapeutic  Palliative (PRN) options:   None established     Recent Visits Date Type Provider Dept  10/25/20 Office Visit Milinda Pointer, MD [facility]  Showing recent visits within past 90 days and meeting all other requirements Today's Visits Date Type Provider Dept  11/21/20 Procedure visit Milinda Pointer, MD [facility]  Showing today's visits and meeting all other requirements Future Appointments Date Type Provider Dept  12/06/20 Appointment Milinda Pointer,  MD [facility]  Showing future appointments  within next 90 days and meeting all other requirements  Disposition: Discharge home  Discharge (Date  Time): 11/21/2020; 1056 hrs.   Primary Care Physician: [name], NP Location: [facility] Note by: Gaspar Cola, MD Date: 11/21/2020; Time: 11:02 AM  Disclaimer:  Medicine is not an Chief Strategy Officer. The only guarantee in medicine is that nothing is guaranteed. It is important to note that the decision to proceed with this intervention was based on the information collected from the patient. The Data and conclusions were drawn from the patient's questionnaire, the interview, and the physical examination. Because the information was provided in large part by the patient, it cannot be guaranteed that it has not been purposely or unconsciously manipulated. Every effort has been made to obtain as much relevant data as possible for this evaluation. It is important to note that the conclusions that lead to this procedure are derived in large part from the available data. Always take into account that the treatment will also be dependent on availability of resources and existing treatment guidelines, considered by other Pain Management Practitioners as being common knowledge and practice, at the time of the intervention. For Medico-Legal purposes, it is also important to point out that variation in procedural techniques and pharmacological choices are the acceptable norm. The indications, contraindications, technique, and results of the above procedure should only be interpreted and judged by a Board-Certified Interventional Pain Specialist with extensive familiarity and expertise in the same exact procedure and technique.

## 2020-11-22 ENCOUNTER — Telehealth: Payer: Self-pay

## 2020-11-22 NOTE — Telephone Encounter (Signed)
Post procedure phone call.  Patient states she has not gotten any relief yet.  Instructed to read information and to document on pain diary and to call us for any further questions or concerns.

## 2020-12-05 NOTE — Progress Notes (Signed)
ToPatient: Grace Shepard  Service Category: E/M  Provider: Gaspar Cola, MD  DOB: 1965-11-23  DOS: 12/06/2020  Location: Office  MRN: [id number]  Setting: Ambulatory outpatient  Referring Provider: [name], NP  Type: Established Patient  Specialty: Interventional Pain Management  PCP: [name], NP  Location: Remote location  Delivery: TeleHealth     Virtual Encounter - Pain Management PROVIDER NOTE: Information contained herein reflects review and annotations entered in association with encounter. Interpretation of such information and data should be left to medically-trained personnel. Information provided to patient can be located elsewhere in the medical record under "Patient Instructions". Document created using STT-dictation technology, any transcriptional errors that may result from process are unintentional.    Contact & Pharmacy Preferred: [phone] Home: [phone] (home) Mobile: [phone] (mobile) E-mail: [email or web address]  [place], [place] [address] [place] [address] Phone: [phone] Fax: [phone]  [id number] [place] [id number]: [phone]: [phone]  Pre-screening  Ms. WEarleen Newportoffered "in-person" vs "virtual" encounter. She indicated preferring virtual for this encounter.   Reason COVID-19*  Social distancing based on CDC and AMA recommendations.   I contacted TDerrill Kayon 12/06/2020 via telephone.      I clearly identified myself as FGaspar Cola MD. I verified that I was speaking with the correct person using two identifiers (Name: TLAYLIA MUI and date of birth: [birth year].  Consent I sought verbal advanced consent from [name] virtual visit interactions. I informed Ms. WEarleen Newportof possible security and privacy concerns, risks, and limitations associated with  providing "not-in-person" medical evaluation and management services. I also informed Ms. WEarleen Newportof the availability of "in-person" appointments. Finally, I informed her that there would be a charge for the virtual visit and that she could be  personally, fully or partially, financially responsible for it. Ms. WZachmanexpressed understanding and agreed to proceed.   Historic Elements   Ms. TJAQUESHA BOROFFis a 55y.o. year old, female patient evaluated today after our last contact on 11/21/2020. Ms. 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 She also  has a past surgical history that includes Tubal ligation. Ms. WLaymonhas a current medication list which includes the following prescription(s): albuterol, baclofen, bupropion, cetirizine, vitamin d3, citalopram, estradiol, levothyroxine, montelukast, pantoprazole, pregabalin, rosuvastatin, topiramate, triamcinolone cream, vitamin b-12, and zolpidem. She  reports that she has never smoked. She has never used smokeless tobacco. She reports that she does not drink alcohol and does not use drugs. Ms. WAccardois allergic to bactrim [sulfamethoxazole-trimethoprim], gabapentin, and tramadol.   HPI  Today, she is being contacted for a post-procedure assessment.  The patient indicates that although she did have some benefit from this second set of injections, she did attain better relief with the first.  She indicates that although the relief on the left side was similar, on the right side it did not seem to be as good.  In reviewing the difference between the two, I can see that on the right side the approach to the SI joint was higher on the second procedure compared to the first.  In addition to that, we also did not do an injection into the right trochanteric bursa,  which we did on  the first treatment.  This too may be the difference between the results that she attained on her first procedure and those of the second.  Based on the above, should the pain return we should then inject the right sacroiliac joint, the left hip joint, and both trochanteric bursa's.  All of this information was shared with the patient and she was invited to go over the note so as to understand the difference between the two.  Post-Procedure Evaluation  Procedure (11/21/2020): Diagnostic right sacroiliac joint block #2 + left IA hip joint injection #2 + left trochanteric bursa injection #2 under fluoroscopic guidance and IV sedation Pre-procedure pain level: 2/10 Post-procedure: 0/10 (100% relief)  Sedation: Sedation provided.  Effectiveness during initial hour after procedure(Ultra-Short Term Relief): 100 %.  Local anesthetic used: Long-acting (4-6 hours) Effectiveness: Defined as any analgesic benefit obtained secondary to the administration of local anesthetics. This carries significant diagnostic value as to the etiological location, or anatomical origin, of the pain. Duration of benefit is expected to coincide with the duration of the local anesthetic used.  Effectiveness during initial 4-6 hours after procedure(Short-Term Relief): 100 %.  Long-term benefit: Defined as any relief past the pharmacologic duration of the local anesthetics.  Effectiveness past the initial 6 hours after procedure(Long-Term Relief): 50 %.  Current benefits: Defined as benefit that persist at this time.   Analgesia:  Ongoing 60% relief of the pain on the right lower back over the SI joint and 70% relief on the left hip area Function: Ms. Tondreau reports improvement in function ROM: Ms. Selleck reports improvement in ROM  Pharmacotherapy Assessment  Analgesic: None. No opioid analgesics prescribed by our practice. MME/day: 0 mg/day   Monitoring: [place] PMP: PDMP reviewed during this encounter.        Pharmacotherapy: No side-effects or adverse reactions reported. Compliance: No problems identified. Effectiveness: Clinically acceptable. Plan: Refer to "POC".  UDS: No results found for: SUMMARY  Laboratory Chemistry Profile   Renal Lab Results  Component Value Date   BUN 10 04/11/2020   CREATININE 0.92 70/35/0093   BCR NOT APPLICABLE 81/82/9937   GFR 64.09 04/11/2020   GFRAA >60 01/17/2020   GFRNONAA 58 (L) 01/17/2020     Hepatic Lab Results  Component Value Date   AST 16 04/11/2020   ALT 18 04/11/2020   ALBUMIN 4.5 04/11/2020   ALKPHOS 106 04/11/2020   LIPASE 24.0 06/15/2015     Electrolytes Lab Results  Component Value Date   NA 143 04/11/2020   K 4.2 04/11/2020   CL 110 04/11/2020   CALCIUM 9.5 04/11/2020   MG 2.2 01/17/2020     Bone Lab Results  Component Value Date   VD25OH 59.05 04/11/2020     Inflammation (CRP: Acute Phase) (ESR: Chronic Phase) Lab Results  Component Value Date   CRP 1.4 (H) 01/17/2020   ESRSEDRATE 9 01/17/2020       Note: Above Lab results reviewed.  Imaging  DG PAIN CLINIC C-ARM 1-60 MIN NO REPORT Fluoro was used, but no Radiologist interpretation will be provided.  Please refer to "NOTES" tab for provider progress note.  Below we can see the images from the sacroiliac joint injection done on 02/08/2020 which the patient indicates they worked better than the one done on 11/21/2020.    Below you can see the images from a sacroiliac joint injection done on 11/21/2020 which today the patient indicated did not work as well as the 1 done on  02/08/2020.    On the images above, I can be appreciated that they SI joint injection done on 02/08/2020 was done lower than the 1 done on 11/21/2020.  Although both injections did provide medicine into the area of the SI joint, the patient did experience more benefit having the injection done lower.  From now on, should we need to repeat the injection we will use the lower approach.  Assessment   The primary encounter diagnosis was Chronic pain syndrome. Diagnoses of Chronic sacroiliac joint pain (Right), Greater trochanteric bursitis of hips (Bilateral), and Chronic hip pain (Left) were also pertinent to this visit.  Plan of Care  Problem-specific:  No problem-specific Assessment & Plan notes found for this encounter.  Ms. JAZMA PICKEL has a current medication list which includes the following long-term medication(s): albuterol, bupropion, citalopram, levothyroxine, montelukast, pantoprazole, pregabalin, rosuvastatin, topiramate, and zolpidem.  Pharmacotherapy (Medications Ordered): No orders of the defined types were placed in this encounter.  Orders:  No orders of the defined types were placed in this encounter.  Follow-up plan:   Return if symptoms worsen or fail to improve.      Interventional Therapies  Risk  Complexity Considerations:   WNL   Planned  Pending:   Pending further evaluation   Under consideration:   Diagnostic bilateral sacroiliac joint block  Diagnostic bilateral trochanteric bursa injections  Diagnostic bilateral IA hip joint injections  Diagnostic right-sided cervical ESI  Diagnostic right-sided occipital nerve blocks  Possible right occipital nerve RFA  Diagnostic right C2/TON nerve block  Possible right C2/TON RFA    Completed:   Therapeutic right SI joint block x2 (11/21/2020) (first: 100/100/90/90-100) (more benefit when the lower approach is used)  Therapeutic left IA hip injection x2 (11/21/2020)  Therapeutic right trochanteric bursa injection x1 (02/08/2020)  Therapeutic left trochanteric bursa injection x2 (11/21/2020)    Therapeutic  Palliative (PRN) options:   Palliative right SI joint block #3 (lower approach)  Palliative right trochanteric bursa injection #2  Palliative left trochanteric bursa injection #3  Palliative left IA hip injection #3     Recent Visits Date Type Provider Dept  11/21/20 Procedure visit Milinda Pointer, MD [facility]  10/25/20 Office Visit Milinda Pointer, MD [facility]  Showing recent visits within past 90 days and meeting all other requirements Today's Visits Date Type Provider Dept  12/06/20 Telemedicine Milinda Pointer, MD [facility]  Showing today's visits and meeting all other requirements Future Appointments No visits were found meeting these conditions. Showing future appointments within next 90 days and meeting all other requirements  I discussed the assessment and treatment plan with the patient. The patient was provided an opportunity to ask questions and all were answered. The patient agreed with the plan and demonstrated an understanding of the instructions.  Patient advised to call back or seek an in-person evaluation if the symptoms or condition worsens.  Duration of encounter: 22 minutes.  Note by: Gaspar Cola, MD Date: 12/06/2020; Time: 6:07 PM

## 2020-12-06 ENCOUNTER — Encounter: Payer: Self-pay | Admitting: Pain Medicine

## 2020-12-06 ENCOUNTER — Ambulatory Visit: Attending: Pain Medicine | Admitting: Pain Medicine

## 2020-12-06 ENCOUNTER — Other Ambulatory Visit: Payer: Self-pay

## 2020-12-06 DIAGNOSIS — G8929 Other chronic pain: Secondary | ICD-10-CM

## 2020-12-06 DIAGNOSIS — M25552 Pain in left hip: Secondary | ICD-10-CM

## 2020-12-06 DIAGNOSIS — M7062 Trochanteric bursitis, left hip: Secondary | ICD-10-CM

## 2020-12-06 DIAGNOSIS — M7061 Trochanteric bursitis, right hip: Secondary | ICD-10-CM | POA: Diagnosis not present

## 2020-12-06 DIAGNOSIS — G894 Chronic pain syndrome: Secondary | ICD-10-CM

## 2020-12-06 DIAGNOSIS — M533 Sacrococcygeal disorders, not elsewhere classified: Secondary | ICD-10-CM | POA: Diagnosis not present

## 2020-12-07 ENCOUNTER — Telehealth: Payer: Self-pay | Admitting: Pain Medicine

## 2020-12-07 NOTE — Telephone Encounter (Signed)
There are notes for yesterday's appt.

## 2020-12-09 ENCOUNTER — Other Ambulatory Visit: Payer: Self-pay | Admitting: Internal Medicine

## 2020-12-09 DIAGNOSIS — E7800 Pure hypercholesterolemia, unspecified: Secondary | ICD-10-CM

## 2020-12-11 ENCOUNTER — Telehealth: Payer: Self-pay

## 2020-12-11 ENCOUNTER — Encounter: Payer: Self-pay | Admitting: Internal Medicine

## 2020-12-11 ENCOUNTER — Other Ambulatory Visit: Payer: Self-pay | Admitting: Internal Medicine

## 2020-12-11 ENCOUNTER — Other Ambulatory Visit: Payer: Self-pay | Admitting: Dermatology

## 2020-12-11 ENCOUNTER — Other Ambulatory Visit: Payer: Self-pay

## 2020-12-11 ENCOUNTER — Ambulatory Visit (INDEPENDENT_AMBULATORY_CARE_PROVIDER_SITE_OTHER): Admitting: Internal Medicine

## 2020-12-11 VITALS — BP 109/75 | HR 74 | Temp 97.5°F | Wt 145.0 lb

## 2020-12-11 DIAGNOSIS — M797 Fibromyalgia: Secondary | ICD-10-CM | POA: Diagnosis not present

## 2020-12-11 DIAGNOSIS — N952 Postmenopausal atrophic vaginitis: Secondary | ICD-10-CM | POA: Insufficient documentation

## 2020-12-11 DIAGNOSIS — G894 Chronic pain syndrome: Secondary | ICD-10-CM | POA: Diagnosis not present

## 2020-12-11 DIAGNOSIS — L308 Other specified dermatitis: Secondary | ICD-10-CM

## 2020-12-11 DIAGNOSIS — L309 Dermatitis, unspecified: Secondary | ICD-10-CM | POA: Insufficient documentation

## 2020-12-11 MED ORDER — TRIAMCINOLONE ACETONIDE 0.1 % EX CREA
1.0000 | TOPICAL_CREAM | Freq: Two times a day (BID) | CUTANEOUS | 0 refills | Status: DC | PRN
Start: 2020-12-11 — End: 2020-12-12

## 2020-12-11 MED ORDER — ZOLPIDEM TARTRATE 5 MG PO TABS: 5.0000 mg | ORAL_TABLET | Freq: Every evening | ORAL | 0 refills | Status: DC | PRN

## 2020-12-11 MED ORDER — ESTRADIOL 0.1 MG/GM VA CREA: 1.0000 | TOPICAL_CREAM | VAGINAL | 1 refills | Status: DC

## 2020-12-11 MED ORDER — PREGABALIN 75 MG PO CAPS: 75.0000 mg | ORAL_CAPSULE | Freq: Two times a day (BID) | ORAL | 0 refills | Status: AC

## 2020-12-11 NOTE — Assessment & Plan Note (Signed)
Triamcinolone refilled today

## 2020-12-11 NOTE — Telephone Encounter (Signed)
Copied from [place] [id number]. Topic: General - Other >> Dec 11, 2020  9:36 AM Leward Quan A wrote: Reason for CRM: Patient called in to inform [name] that the last Rx she received of the zolpidem (AMBIEN CR) 6.25 MG CR tablet but say that it has hit her hard and she is not sure if this is a bad batch or there is a change in the medication itself. Asking if possible to get a new Rx for the zolpidem (AMBIEN CR) 5 MG CR tablet. Would like an answer please ASAP. Can be reached at Ph# [phone]

## 2020-12-11 NOTE — Progress Notes (Signed)
Subjective:    Patient ID: Grace Shepard, female    DOB: March 03, 1966, 55 y.o.   MRN: [id number]  HPI  Pt presents to the clinic today for follow up insomnia. She reports she has gotten a bad batch of Ambien. She reports it is too effective, almost seems like it is the 12.5 mg CR instead of the 6.25 mg CR dose. She reports multiple falls in the last month, and does not even remember the last fall that she had. She reports her husband also told her that she was cutting her nails with a very sharp knife that she uses to clean the shower head with which is something she has never done before. She is also on multiple other sedating medications, such as Pregabalin, Topiramate, and Baclofen, but has been on these for years with no changes in dose. She would like a RX for Ambien 5 mg, as she is refusing to take the last batch of 6.25 mg she got. She does plan on eventually weaning of Ambien because she has been on it for so many years.  She also needs a refill of Pregablin which she takes for chronic pain and fibromyalgia. She reports this does work well for her.  She would like a refill of Triamcinolone for eczema and Estradiol cream for postmenopausal vaginal atrophy.    Review of Systems      Past Medical History:  Diagnosis Date  . Allergic rhinitis   . Arthritis   . Depression   . Fibromyalgia   . 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  . Hyperlipidemia   . Ischemic colitis ([place])   . Migraine   . Thyroid disease   . UTI (lower urinary tract infection)     Current Outpatient Medications  Medication Sig Dispense Refill  . albuterol (PROVENTIL HFA;VENTOLIN HFA) 108 (90 Base) MCG/ACT inhaler Inhale into the lungs.    . baclofen (LIORESAL) 10 MG tablet Take 10 mg by mouth as needed for muscle spasms.    Marland Kitchen buPROPion (WELLBUTRIN XL) 150 MG 24 hr tablet TAKE 1 TABLET DAILY 90 tablet 0  . cetirizine (ZYRTEC) 10 MG tablet Take 10 mg by mouth daily.    .  Cholecalciferol (VITAMIN D3) 125 MCG (5000 UT) CAPS Take by mouth.    . citalopram (CELEXA) 40 MG tablet Take 1 tablet (40 mg total) by mouth daily. 90 tablet 1  . estradiol (ESTRACE) 0.1 MG/GM vaginal cream Place 1 Applicatorful vaginally 3 (three) times a week. 42.5 g 12  . levothyroxine (SYNTHROID) 75 MCG tablet TAKE 1 TABLET DAILY 90 tablet 2  . montelukast (SINGULAIR) 10 MG tablet Take 1 tablet (10 mg total) by mouth at bedtime. 90 tablet 3  . pantoprazole (PROTONIX) 40 MG tablet Take 1 tablet (40 mg total) by mouth daily. 90 tablet 2  . pregabalin (LYRICA) 75 MG capsule TAKE 1 CAPSULE TWICE A DAY 180 capsule 1  . rosuvastatin (CRESTOR) 10 MG tablet Take 1 tablet (10 mg total) by mouth daily. 90 tablet 0  . topiramate (TOPAMAX) 50 MG tablet 100 mg daily.     Marland Kitchen triamcinolone cream (KENALOG) 0.1 % Apply 1 application topically 2 (two) times daily as needed. Avoid face, groin, axilla 30 g 0  . vitamin B-12 (CYANOCOBALAMIN) 1000 MCG tablet Take 50 mcg by mouth every other day.     . zolpidem (AMBIEN CR) 6.25 MG CR tablet Take 1 tablet (6.25 mg total) by mouth at bedtime as  needed. 90 tablet 0   No current facility-administered medications for this visit.    Allergies  Allergen Reactions  . Bactrim [Sulfamethoxazole-Trimethoprim] Hives  . Gabapentin Other (See Comments)  . Tramadol Itching    Family History  Problem Relation Age of Onset  . Drug abuse Sister   . Alcohol abuse Sister   . Depression Sister   . Rheum arthritis Father   . Congestive Heart Failure Father   . Depression Father   . Diabetes Father   . Hearing loss Father   . Osteoarthritis Mother   . Stroke Mother   . Aneurysm Mother   . Breast cancer Maternal Aunt   . Heart attack Maternal Grandfather   . Diabetes Brother   . Alcohol abuse Paternal Uncle   . Arthritis Sister   . Depression Sister   . Heart disease Sister   . Crohn's disease Sister     Social History   Socioeconomic History  . Marital  status: Married    Spouse name: Not on file  . Number of children: Not on file  . Years of education: Not on file  . Highest education level: Not on file  Occupational History  . Not on file  Tobacco Use  . Smoking status: Never Smoker  . Smokeless tobacco: Never Used  Vaping Use  . Vaping Use: Never used  Substance and Sexual Activity  . Alcohol use: No  . Drug use: No  . Sexual activity: Yes    Comment: Perimenopausal  Other Topics Concern  . Not on file  Social History Narrative  . Not on file   Social Determinants of Health   Financial Resource Strain: Not on file  Food Insecurity: Not on file  Transportation Needs: Not on file  Physical Activity: Not on file  Stress: Not on file  Social Connections: Not on file  Intimate Partner Violence: Not on file     Constitutional: Denies fever, malaise, fatigue, headache or abrupt weight changes.  Respiratory: Denies difficulty breathing, shortness of breath, cough or sputum production.   Cardiovascular: Denies chest pain, chest tightness, palpitations or swelling in the hands or feet.  Musculoskeletal: Pt reports frequent falls that she does not recall, chronic muscle and joint pain. Denies decrease in range of motion, difficulty with gait, muscle pain or joint swelling.  Skin: Pt reports eczema and vaginal dryness. Denies redness, lesions or ulcercations.  Neurological: Denies dizziness, difficulty with memory, difficulty with speech or problems with balance and coordination.  Psych: Pt has a history of anxiety and depression. Denies SI/HI.  No other specific complaints in a complete review of systems (except as listed in HPI above).  Objective:   Physical Exam  BP 109/75 (BP Location: Left Arm, Patient Position: Sitting, Cuff Size: Normal)   Pulse 74   Temp (!) 97.5 F (36.4 C) (Temporal)   Wt 145 lb (65.8 kg)   LMP 05/08/2015 Comment: neg preg  SpO2 99%   BMI 29.29 kg/m   Wt Readings from Last 3 Encounters:   11/21/20 145 lb (65.8 kg)  10/25/20 150 lb (68 kg)  10/11/20 145 lb (65.8 kg)    General: Appears her stated age, well developed, well nourished in NAD. Skin: Warm, dry and intact. No rashes noted. HEENT: Head: normal shape and size; Eyes: sclera white and EOMs intact; Cardiovascular: Normal rate. Pulmonary/Chest: Normal effort. Musculoskeletal:  No difficulty with gait.  Neurological: Alert and oriented.  Psychiatric: Mood and affect normal. Behavior is normal. Judgment and  thought content normal.     BMET    Component Value Date/Time   NA 143 04/11/2020 1205   NA 139 05/29/2014 0516   K 4.2 04/11/2020 1205   K 3.6 05/29/2014 0516   CL 110 04/11/2020 1205   CL 104 05/29/2014 0516   CO2 26 04/11/2020 1205   CO2 24 05/29/2014 0516   GLUCOSE 84 04/11/2020 1205   GLUCOSE 130 (H) 05/29/2014 0516   BUN 10 04/11/2020 1205   BUN 7 05/29/2014 0516   CREATININE 0.92 04/11/2020 1205   CREATININE 0.87 09/11/2018 1628   CALCIUM 9.5 04/11/2020 1205   CALCIUM 8.6 05/29/2014 0516   GFRNONAA 58 (L) 01/17/2020 1346   GFRNONAA >60 05/29/2014 0516   GFRNONAA >60 06/28/2013 0511   GFRAA >60 01/17/2020 1346   GFRAA >60 05/29/2014 0516   GFRAA >60 06/28/2013 0511    Lipid Panel     Component Value Date/Time   CHOL 122 04/11/2020 1205   TRIG 107.0 04/11/2020 1205   HDL 53.60 04/11/2020 1205   CHOLHDL 2 04/11/2020 1205   VLDL 21.4 04/11/2020 1205   LDLCALC 47 04/11/2020 1205   LDLCALC 109 (H) 09/11/2018 1628    CBC    Component Value Date/Time   WBC 7.4 04/11/2020 1205   RBC 4.68 04/11/2020 1205   HGB 13.7 04/11/2020 1205   HGB 13.9 05/29/2014 0516   HCT 40.5 04/11/2020 1205   HCT 41.3 05/29/2014 0516   PLT 233.0 04/11/2020 1205   PLT 227 05/29/2014 0516   MCV 86.6 04/11/2020 1205   MCV 89 05/29/2014 0516   MCH 30.4 06/16/2016 1828   MCHC 33.7 04/11/2020 1205   RDW 13.5 04/11/2020 1205   RDW 13.2 05/29/2014 0516   LYMPHSABS 1.9 12/30/2014 1425   LYMPHSABS 1.8  05/29/2014 0516   MONOABS 0.7 12/30/2014 1425   MONOABS 1.1 (H) 05/29/2014 0516   EOSABS 0.2 12/30/2014 1425   EOSABS 0.2 05/29/2014 0516   BASOSABS 0.1 12/30/2014 1425   BASOSABS 0.1 05/29/2014 0516    Hgb A1C Lab Results  Component Value Date   HGBA1C 5.6 04/11/2020          Assessment & Plan:   Amnesia, Frequent Falls, Insomnia:  She thinks she got a bad batch of Ambien Advised her it could be change in the manufacturer vs polypharmacy causing her issues Will consider referral to chronic care management for evaluation of medications Ambien 5 mg PO QHS sent to pharmacy  RTC in 3 months for your annual exam  [name], NP This visit occurred during the SARS-CoV-2 public health emergency.  Safety protocols were in place, including screening questions prior to the visit, additional usage of staff PPE, and extensive cleaning of exam room while observing appropriate contact time as indicated for disinfecting solutions.

## 2020-12-11 NOTE — Telephone Encounter (Signed)
Copied from [place] [id number]. Topic: Quick Communication - Rx Refill/Question >> Dec 11, 2020  9:41 AM Leward Quan A wrote: Medication: pregabalin (LYRICA) 75 MG capsule   Has the patient contacted their pharmacy? Yes.   (Agent: If no, request that the patient contact the pharmacy for the refill.) (Agent: If yes, when and what did the pharmacy advise?)  Preferred Pharmacy (with phone number or street name): [facility] - [address], [place]  Phone:  [phone] Fax:  [phone]     Agent: Please be advised that RX refills may take up to 3 business days. We ask that you follow-up with your pharmacy.

## 2020-12-11 NOTE — Telephone Encounter (Signed)
Contacted patient and got her schedule with [name] at [facility]

## 2020-12-11 NOTE — Patient Instructions (Signed)
Transient Global Amnesia Transient global amnesia causes a sudden and temporary (transient) loss of memory (amnesia). You may recall memories from your distant past and people you know well. However, you may not recall things that happened more recently in the past days, months, or even year. A transient global amnesia episode does not last longer than 24 hours. Transient global amnesia does not affect your other brain functions. Your memory usually returns to normal after an episode is over. One episode of transient global amnesia does not make you more likely to have a stroke, a relapse, or other complications. What are the causes? The cause of this condition is not known. Certain activities have been reported to trigger transient global amnesia. These activities include:  Swimming in very cold or hot water.  Sexual intercourse.  Emotional distress, such as receiving bad news or having a lot of stress at once.  Strenuous exercise or activity. What increases the risk? You are more likely to develop this condition if:  You are 53-52 years old.  You have a history of migraine headaches. What are the signs or symptoms? The main symptoms of this condition include:  Being unable to remember recent events.  Asking repetitive questions about a situation and surroundings and not recalling the answers to these questions. Other symptoms include:  Restlessness and nervousness.  Confusion.  Headaches.  Dizziness.  Nausea. How is this diagnosed? This condition may be diagnosed based on:  Your symptoms.  A physical exam.  A test to check your mental abilities (cognitive evaluation).  Imaging studies to check brain function. These may include: ? Electroencephalogram (EEG). This test checks the brain's electrical activity. ? CT scan. ? MRI.   How is this treated? There is no treatment for this condition. An episode typically goes away on its own after a few hours. You may receive  medicines to treat other conditions, such as a migraine. Follow these instructions at home:  Take over-the-counter and prescription medicines only as told by your health care provider.  Avoid taking medicines that can affect thinking, such as pain or sleeping medicines.  Learn what activities may trigger an episode. Avoid these activities as told by your health care provider.  Find ways to manage stress, such as meditation or yoga.  Keep all follow-up visits as told by your health care provider. This is important. Contact a health care provider if you:  Have a migraine that does not go away.  Experience transient global amnesia repeatedly. Get help right away if you:  Have a seizure. Summary  Transient global amnesia causes a sudden and temporary (transient) loss of memory (amnesia).  There is no treatment for this condition. An episode typically goes away on its own after a few hours.  You may receive medicines to treat other conditions, such as a migraine.  Transient global amnesia does not affect your other brain functions. Your memory usually returns to normal after an episode is over. This information is not intended to replace advice given to you by your health care provider. Make sure you discuss any questions you have with your health care provider. Document Revised: 08/13/2017 Document Reviewed: 08/13/2017 Elsevier Patient Education  2021 [employer].

## 2020-12-11 NOTE — Telephone Encounter (Signed)
FYI.... Pt is seeing Rollene Fare today at 1:20 (12/11/2020)  Thanks,   -Mickel Baas

## 2020-12-11 NOTE — Assessment & Plan Note (Signed)
Pregabalin refilled today

## 2020-12-11 NOTE — Assessment & Plan Note (Signed)
Estradiol cream refilled today

## 2020-12-12 ENCOUNTER — Other Ambulatory Visit: Payer: Self-pay | Admitting: Internal Medicine

## 2020-12-12 DIAGNOSIS — E7800 Pure hypercholesterolemia, unspecified: Secondary | ICD-10-CM

## 2020-12-12 MED ORDER — ROSUVASTATIN CALCIUM 10 MG PO TABS: 10.0000 mg | ORAL_TABLET | Freq: Every day | ORAL | 0 refills | Status: DC

## 2020-12-12 NOTE — Telephone Encounter (Signed)
This was discussed at her visit yesterday

## 2020-12-12 NOTE — Telephone Encounter (Signed)
Patient needs appointment for further refills

## 2021-01-11 ENCOUNTER — Other Ambulatory Visit: Payer: Self-pay | Admitting: Internal Medicine

## 2021-01-11 NOTE — Telephone Encounter (Signed)
Requested medication (s) are due for refill today - yes  Requested medication (s) are on the active medication list -yes  Future visit scheduled - yes  Last refill: 12/11/20  Notes to clinic: Request RF- non delegated Rx  Requested Prescriptions  Pending Prescriptions Disp Refills   zolpidem (AMBIEN) 5 MG tablet [Pharmacy Med Name: ZOLPIDEM TARTRATE 5 MG TABLET] 30 tablet 0    Sig: TAKE 1 TABLET BY MOUTH AT BEDTIME AS NEEDED FOR SLEEP.      Not Delegated - Psychiatry:  Anxiolytics/Hypnotics Failed - 01/11/2021  3:24 PM      Failed - This refill cannot be delegated      Failed - Urine Drug Screen completed in last 360 days      Passed - Valid encounter within last 6 months    Recent Outpatient Visits           1 month ago Postmenopausal atrophic vaginitis   [facility] [place], Coralie Keens, NP       Future Appointments             In 2 months Baity, Coralie Keens, NP [facility], [facility]                 Requested Prescriptions  Pending Prescriptions Disp Refills   zolpidem (AMBIEN) 5 MG tablet [Pharmacy Med Name: ZOLPIDEM TARTRATE 5 MG TABLET] 30 tablet 0    Sig: TAKE 1 TABLET BY MOUTH AT BEDTIME AS NEEDED FOR SLEEP.      Not Delegated - Psychiatry:  Anxiolytics/Hypnotics Failed - 01/11/2021  3:24 PM      Failed - This refill cannot be delegated      Failed - Urine Drug Screen completed in last 360 days      Passed - Valid encounter within last 6 months    Recent Outpatient Visits           1 month ago Postmenopausal atrophic vaginitis   [facility] [place], Coralie Keens, NP       Future Appointments             In 2 months Baity, Coralie Keens, NP [facility], [facility]

## 2021-01-19 ENCOUNTER — Other Ambulatory Visit: Payer: Self-pay | Admitting: Podiatry

## 2021-01-19 DIAGNOSIS — M79672 Pain in left foot: Secondary | ICD-10-CM

## 2021-01-26 ENCOUNTER — Ambulatory Visit
Admission: RE | Admit: 2021-01-26 | Discharge: 2021-01-26 | Disposition: A | Discharge status: Home / Self Care | Source: Ambulatory Visit | Attending: Podiatry | Admitting: Podiatry

## 2021-01-26 ENCOUNTER — Other Ambulatory Visit: Payer: Self-pay

## 2021-01-26 DIAGNOSIS — M79672 Pain in left foot: Secondary | ICD-10-CM | POA: Insufficient documentation

## 2021-02-06 ENCOUNTER — Telehealth: Payer: Self-pay

## 2021-02-06 NOTE — Telephone Encounter (Signed)
Copied from [place] [id number]. Topic: General - Other >> Feb 02, 2021  2:41 PM Pawlus, Brayton Layman A wrote: Reason for CRM: Pt wanted to know what alternatives she could take instead of ambien. Pt stated taking half a pill has not been working for her, Please advise.

## 2021-02-06 NOTE — Telephone Encounter (Signed)
Has she tried taking a whole pill?

## 2021-02-07 ENCOUNTER — Encounter: Payer: Self-pay | Admitting: Internal Medicine

## 2021-02-08 NOTE — Telephone Encounter (Signed)
Yes, have her schedule a follow up to discuss

## 2021-02-15 NOTE — Telephone Encounter (Signed)
Appt scheduled at 9:40am.

## 2021-02-19 ENCOUNTER — Encounter: Payer: Self-pay | Admitting: Internal Medicine

## 2021-02-19 ENCOUNTER — Other Ambulatory Visit: Payer: Self-pay

## 2021-02-19 ENCOUNTER — Ambulatory Visit (INDEPENDENT_AMBULATORY_CARE_PROVIDER_SITE_OTHER): Admitting: Internal Medicine

## 2021-02-19 VITALS — BP 107/77 | HR 87 | Temp 97.1°F | Resp 17 | Ht 59.0 in | Wt 152.0 lb

## 2021-02-19 DIAGNOSIS — F419 Anxiety disorder, unspecified: Secondary | ICD-10-CM

## 2021-02-19 DIAGNOSIS — F5104 Psychophysiologic insomnia: Secondary | ICD-10-CM | POA: Diagnosis not present

## 2021-02-19 DIAGNOSIS — F32A Depression, unspecified: Secondary | ICD-10-CM

## 2021-02-19 DIAGNOSIS — Z6830 Body mass index (BMI) 30.0-30.9, adult: Secondary | ICD-10-CM

## 2021-02-19 DIAGNOSIS — E6609 Other obesity due to excess calories: Secondary | ICD-10-CM | POA: Insufficient documentation

## 2021-02-19 MED ORDER — BUPROPION HCL ER (XL) 300 MG PO TB24
300.0000 mg | ORAL_TABLET | Freq: Every day | ORAL | 0 refills | Status: DC
Start: 2021-02-19 — End: 2021-06-11

## 2021-02-19 MED ORDER — TRAZODONE HCL 50 MG PO TABS: 50.0000 mg | ORAL_TABLET | Freq: Every evening | ORAL | 0 refills | Status: DC | PRN

## 2021-02-19 NOTE — Assessment & Plan Note (Signed)
Deteriorated Will increase Wellbutrin to 300 mg daily Continue Citalopram 40 mg day Support offered

## 2021-02-19 NOTE — Patient Instructions (Signed)
Insomnia Insomnia is a sleep disorder that makes it difficult to fall asleep or stay asleep. Insomnia can cause fatigue, low energy, difficulty concentrating, moodswings, and poor performance at work or school. There are three different ways to classify insomnia: Difficulty falling asleep. Difficulty staying asleep. Waking up too early in the morning. Any type of insomnia can be long-term (chronic) or short-term (acute). Both are common. Short-term insomnia usually lasts for three months or less. Chronic insomnia occurs at least three times a week for longer than threemonths. What are the causes? Insomnia may be caused by another condition, situation, or substance, such as: Anxiety. Certain medicines. Gastroesophageal reflux disease (GERD) or other gastrointestinal conditions. Asthma or other breathing conditions. Restless legs syndrome, sleep apnea, or other sleep disorders. Chronic pain. Menopause. Stroke. Abuse of alcohol, tobacco, or illegal drugs. Mental health conditions, such as depression. Caffeine. Neurological disorders, such as Alzheimer's disease. An overactive thyroid (hyperthyroidism). Sometimes, the cause of insomnia may not be known. What increases the risk? Risk factors for insomnia include: Gender. Women are affected more often than men. Age. Insomnia is more common as you get older. Stress. Lack of exercise. Irregular work schedule or working night shifts. Traveling between different time zones. Certain medical and mental health conditions. What are the signs or symptoms? If you have insomnia, the main symptom is having trouble falling asleep or having trouble staying asleep. This may lead to other symptoms, such as: Feeling fatigued or having low energy. Feeling nervous about going to sleep. Not feeling rested in the morning. Having trouble concentrating. Feeling irritable, anxious, or depressed. How is this diagnosed? This condition may be diagnosed based  on: Your symptoms and medical history. Your health care provider may ask about: Your sleep habits. Any medical conditions you have. Your mental health. A physical exam. How is this treated? Treatment for insomnia depends on the cause. Treatment may focus on treating an underlying condition that is causing insomnia. Treatment may also include: Medicines to help you sleep. Counseling or therapy. Lifestyle adjustments to help you sleep better. Follow these instructions at home: Eating and drinking  Limit or avoid alcohol, caffeinated beverages, and cigarettes, especially close to bedtime. These can disrupt your sleep. Do not eat a large meal or eat spicy foods right before bedtime. This can lead to digestive discomfort that can make it hard for you to sleep.  Sleep habits  Keep a sleep diary to help you and your health care provider figure out what could be causing your insomnia. Write down: When you sleep. When you wake up during the night. How well you sleep. How rested you feel the next day. Any side effects of medicines you are taking. What you eat and drink. Make your bedroom a dark, comfortable place where it is easy to fall asleep. Put up shades or blackout curtains to block light from outside. Use a white noise machine to block noise. Keep the temperature cool. Limit screen use before bedtime. This includes: Watching TV. Using your smartphone, tablet, or computer. Stick to a routine that includes going to bed and waking up at the same times every day and night. This can help you fall asleep faster. Consider making a quiet activity, such as reading, part of your nighttime routine. Try to avoid taking naps during the day so that you sleep better at night. Get out of bed if you are still awake after 15 minutes of trying to sleep. Keep the lights down, but try reading or doing a quiet   activity. When you feel sleepy, go back to bed.  General instructions Take over-the-counter  and prescription medicines only as told by your health care provider. Exercise regularly, as told by your health care provider. Avoid exercise starting several hours before bedtime. Use relaxation techniques to manage stress. Ask your health care provider to suggest some techniques that may work well for you. These may include: Breathing exercises. Routines to release muscle tension. Visualizing peaceful scenes. Make sure that you drive carefully. Avoid driving if you feel very sleepy. Keep all follow-up visits as told by your health care provider. This is important. Contact a health care provider if: You are tired throughout the day. You have trouble in your daily routine due to sleepiness. You continue to have sleep problems, or your sleep problems get worse. Get help right away if: You have serious thoughts about hurting yourself or someone else. If you ever feel like you may hurt yourself or others, or have thoughts about taking your own life, get help right away. You can go to your nearest emergency department or call: Your local emergency services (911 in the [place] A suicide crisis helpline, such as the National Suicide Prevention Lifeline at 1-800-273-8255. This is open 24 hours a day. Summary Insomnia is a sleep disorder that makes it difficult to fall asleep or stay asleep. Insomnia can be long-term (chronic) or short-term (acute). Treatment for insomnia depends on the cause. Treatment may focus on treating an underlying condition that is causing insomnia. Keep a sleep diary to help you and your health care provider figure out what could be causing your insomnia. This information is not intended to replace advice given to you by your health care provider. Make sure you discuss any questions you have with your healthcare provider. Document Revised: 06/08/2020 Document Reviewed: 06/08/2020 Elsevier Patient Education  2022 Elsevier Inc.  

## 2021-02-19 NOTE — Progress Notes (Signed)
Subjective:    Patient ID: Grace Shepard, female    DOB: 1966/01/19, 55 y.o.   MRN: [id number]  HPI  Pt presents to the clinic today for follow up anxiety, depression and  insomnia. She is wanting to wean off the Ambien because she is doing things that she does not remember. She has weaned herself down to 2.5 mg. She reports she is sleeping 8-10 hours but never feels well rested when she wakes up. She got down to 1.25 mg but was not sleeping at all. She does not think she has ever taken any other sleep aids in the past but does not want anything controlled or addictive. She reports her mood has been slightly worse lately. She is feeling anxious and overwhelmed. She is taking Citalopram and Wellbutrin as prescribed. She is not currently seeing a therapist. She reports her husband does reports that she snores. There is no sleep study on file but she report she had one years ago and was told she doesn't have sleep apnea.  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   Hyperlipidemia    Ischemic colitis ([place])    Migraine    Thyroid disease    UTI (lower urinary tract infection)     Current Outpatient Medications  Medication Sig Dispense Refill   albuterol (PROVENTIL HFA;VENTOLIN HFA) 108 (90 Base) MCG/ACT inhaler Inhale into the lungs.     baclofen (LIORESAL) 10 MG tablet Take 10 mg by mouth as needed for muscle spasms.     buPROPion (WELLBUTRIN XL) 150 MG 24 hr tablet TAKE 1 TABLET DAILY 90 tablet 0   cetirizine (ZYRTEC) 10 MG tablet Take 10 mg by mouth daily.     Cholecalciferol (VITAMIN D3) 125 MCG (5000 UT) CAPS Take by mouth.     citalopram (CELEXA) 40 MG tablet Take 1 tablet (40 mg total) by mouth daily. 90 tablet 1   estradiol (ESTRACE) 0.1 MG/GM vaginal cream Place 1 Applicatorful vaginally 3 (three) times a week. 42.5 g 1    levothyroxine (SYNTHROID) 75 MCG tablet TAKE 1 TABLET DAILY 90 tablet 2   montelukast (SINGULAIR) 10 MG tablet Take 1 tablet (10 mg total) by mouth at bedtime. 90 tablet 3   pantoprazole (PROTONIX) 40 MG tablet Take 1 tablet (40 mg total) by mouth daily. 90 tablet 2   pregabalin (LYRICA) 75 MG capsule Take 1 capsule (75 mg total) by mouth 2 (two) times daily. 180 capsule 0   rosuvastatin (CRESTOR) 10 MG tablet Take 1 tablet (10 mg total) by mouth daily. 90 tablet 0   topiramate (TOPAMAX) 50 MG tablet 100 mg daily.      triamcinolone cream (KENALOG) 0.1 % APPLY 1 APPLICATION TOPICALLY TWICE A DAY AS NEEDED. AVOID FACE, GROIN, AXILLA 30 g 0   vitamin B-12 (CYANOCOBALAMIN) 1000 MCG tablet Take 50 mcg by mouth every other day.      zolpidem (AMBIEN) 5 MG tablet TAKE 1 TABLET BY MOUTH AT BEDTIME AS NEEDED FOR SLEEP. 30 tablet 0   No current facility-administered medications for this visit.    Allergies  Allergen Reactions   Bactrim [Sulfamethoxazole-Trimethoprim] Hives   Gabapentin Other (See Comments)   Tramadol Itching    Family History  Problem Relation Age of Onset   Drug abuse Sister  Alcohol abuse Sister    Depression Sister    Rheum arthritis Father    Congestive Heart Failure Father    Depression Father    Diabetes Father    Hearing loss Father    Osteoarthritis Mother    Stroke Mother    Aneurysm Mother    Breast cancer Maternal Aunt    Heart attack Maternal Grandfather    Diabetes Brother    Alcohol abuse Paternal Uncle    Arthritis Sister    Depression Sister    Heart disease Sister    Crohn's disease Sister     Social History   Socioeconomic History   Marital status: Married    Spouse name: Not on file   Number of children: Not on file   Years of education: Not on file   Highest education level: Not on file  Occupational History   Not on file  Tobacco Use   Smoking status: Never   Smokeless tobacco: Never  Vaping Use   Vaping Use: Never used   Substance and Sexual Activity   Alcohol use: No   Drug use: No   Sexual activity: Yes    Comment: Perimenopausal  Other Topics Concern   Not on file  Social History Narrative   Not on file   Social Determinants of Health   Financial Resource Strain: Not on file  Food Insecurity: Not on file  Transportation Needs: Not on file  Physical Activity: Not on file  Stress: Not on file  Social Connections: Not on file  Intimate Partner Violence: Not on file     Constitutional: Pt reports chronic fatigue. Denies fever, malaise, headache or abrupt weight changes.  Respiratory: Denies difficulty breathing, shortness of breath, cough or sputum production.   Cardiovascular: Denies chest pain, chest tightness, palpitations or swelling in the hands or feet.  Neurological: Pt reports insomnia. Denies dizziness, difficulty with memory, difficulty with speech or problems with balance and coordination.  Psych: Pt reports anxiety and depression. Denies SI/HI.  No other specific complaints in a complete review of systems (except as listed in HPI above).  Objective:   Physical Exam  BP 107/77 (BP Location: Left Arm, Patient Position: Sitting, Cuff Size: Normal)   Pulse 87   Temp (!) 97.1 F (36.2 C) (Temporal)   Resp 17   Ht 4\' 11"  (1.499 m)   Wt 152 lb (68.9 kg)   LMP 05/08/2015 Comment: neg preg  SpO2 100%   BMI 30.70 kg/m   Wt Readings from Last 3 Encounters:  01/26/21 145 lb (65.8 kg)  12/11/20 145 lb (65.8 kg)  11/21/20 145 lb (65.8 kg)    General: Appears her stated age, obese, in NAD. Skin: Warm, dry and intact.  HEENT: Head: normal shape and size;  Cardiovascular: Normal rate. Pulmonary/Chest: Normal effort. Neurological: Alert and oriented.  Psychiatric: Flat affect. Behavior is normal. Judgment and thought content normal.     BMET    Component Value Date/Time   NA 143 04/11/2020 1205   NA 139 05/29/2014 0516   K 4.2 04/11/2020 1205   K 3.6 05/29/2014 0516   CL  110 04/11/2020 1205   CL 104 05/29/2014 0516   CO2 26 04/11/2020 1205   CO2 24 05/29/2014 0516   GLUCOSE 84 04/11/2020 1205   GLUCOSE 130 (H) 05/29/2014 0516   BUN 10 04/11/2020 1205   BUN 7 05/29/2014 0516   CREATININE 0.92 04/11/2020 1205   CREATININE 0.87 09/11/2018 1628   CALCIUM 9.5 04/11/2020  1205   CALCIUM 8.6 05/29/2014 0516   GFRNONAA 58 (L) 01/17/2020 1346   GFRNONAA >60 05/29/2014 0516   GFRNONAA >60 06/28/2013 0511   GFRAA >60 01/17/2020 1346   GFRAA >60 05/29/2014 0516   GFRAA >60 06/28/2013 0511    Lipid Panel     Component Value Date/Time   CHOL 122 04/11/2020 1205   TRIG 107.0 04/11/2020 1205   HDL 53.60 04/11/2020 1205   CHOLHDL 2 04/11/2020 1205   VLDL 21.4 04/11/2020 1205   LDLCALC 47 04/11/2020 1205   LDLCALC 109 (H) 09/11/2018 1628    CBC    Component Value Date/Time   WBC 7.4 04/11/2020 1205   RBC 4.68 04/11/2020 1205   HGB 13.7 04/11/2020 1205   HGB 13.9 05/29/2014 0516   HCT 40.5 04/11/2020 1205   HCT 41.3 05/29/2014 0516   PLT 233.0 04/11/2020 1205   PLT 227 05/29/2014 0516   MCV 86.6 04/11/2020 1205   MCV 89 05/29/2014 0516   MCH 30.4 06/16/2016 1828   MCHC 33.7 04/11/2020 1205   RDW 13.5 04/11/2020 1205   RDW 13.2 05/29/2014 0516   LYMPHSABS 1.9 12/30/2014 1425   LYMPHSABS 1.8 05/29/2014 0516   MONOABS 0.7 12/30/2014 1425   MONOABS 1.1 (H) 05/29/2014 0516   EOSABS 0.2 12/30/2014 1425   EOSABS 0.2 05/29/2014 0516   BASOSABS 0.1 12/30/2014 1425   BASOSABS 0.1 05/29/2014 0516    Hgb A1C Lab Results  Component Value Date   HGBA1C 5.6 04/11/2020            Assessment & Plan:   [name], NP This visit occurred during the SARS-CoV-2 public health emergency.  Safety protocols were in place, including screening questions prior to the visit, additional usage of staff PPE, and extensive cleaning of exam room while observing appropriate contact time as indicated for disinfecting solutions.

## 2021-02-19 NOTE — Assessment & Plan Note (Signed)
Encouraged diet and exercise for weight loss ?

## 2021-02-19 NOTE — Assessment & Plan Note (Signed)
Deteriorated, while weaning off Ambien Will trial Trazadone 50-100 mg dat bedtime Encouraged calming sleep routine, dark, quiet room

## 2021-02-21 ENCOUNTER — Encounter: Payer: Self-pay | Admitting: Internal Medicine

## 2021-02-26 ENCOUNTER — Other Ambulatory Visit: Payer: Self-pay | Admitting: Internal Medicine

## 2021-03-05 ENCOUNTER — Encounter: Payer: Self-pay | Admitting: Internal Medicine

## 2021-03-05 ENCOUNTER — Other Ambulatory Visit: Payer: Self-pay

## 2021-03-05 ENCOUNTER — Other Ambulatory Visit

## 2021-03-06 ENCOUNTER — Ambulatory Visit (INDEPENDENT_AMBULATORY_CARE_PROVIDER_SITE_OTHER): Admitting: Internal Medicine

## 2021-03-06 ENCOUNTER — Encounter: Payer: Self-pay | Admitting: Internal Medicine

## 2021-03-06 ENCOUNTER — Other Ambulatory Visit: Payer: Self-pay

## 2021-03-06 VITALS — BP 105/64 | HR 75 | Temp 97.0°F | Resp 17 | Ht 59.0 in | Wt 149.0 lb

## 2021-03-06 DIAGNOSIS — G4719 Other hypersomnia: Secondary | ICD-10-CM | POA: Diagnosis not present

## 2021-03-06 DIAGNOSIS — E6609 Other obesity due to excess calories: Secondary | ICD-10-CM

## 2021-03-06 DIAGNOSIS — R5382 Chronic fatigue, unspecified: Secondary | ICD-10-CM | POA: Diagnosis not present

## 2021-03-06 DIAGNOSIS — Z6830 Body mass index (BMI) 30.0-30.9, adult: Secondary | ICD-10-CM

## 2021-03-06 DIAGNOSIS — R0683 Snoring: Secondary | ICD-10-CM

## 2021-03-06 DIAGNOSIS — F419 Anxiety disorder, unspecified: Secondary | ICD-10-CM

## 2021-03-06 DIAGNOSIS — F32A Depression, unspecified: Secondary | ICD-10-CM

## 2021-03-06 MED ORDER — VENLAFAXINE HCL ER 150 MG PO CP24: 150.0000 mg | ORAL_CAPSULE | Freq: Every day | ORAL | 2 refills | Status: DC

## 2021-03-06 NOTE — Progress Notes (Addendum)
Subjective:    Patient ID: Grace Shepard, female    DOB: [birth year], 55 y.o.   MRN: [id number]  HPI  Patient presents the clinic today with complaint of chronic fatigue and insomnia.  She reports she does not feel rested when she wakes up and is tired all day long.  She will nod off if she sits for too long, no matter the time of day.  She recently weaned herself off Ambien due to side effects.  She is currently taking 25 mg of Trazodone. She does snore, she wakes up gasping/choking at times. She has not noticed any witnessed apnea spells. She is interested in getting a sleep study done.  She does have a history of anxiety and depression.  She does not feel like this is well controlled on Citalopram and Bupropion.  She reports she has been on this for years and is concerned that it may have lost effectiveness.  She was on Sertraline and 1 other medication that she cannot remember the name of in the past.  She is not currently seeing a therapist.  She denies SI/HI.  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   Hyperlipidemia    Ischemic colitis ([place])    Migraine    Thyroid disease    UTI (lower urinary tract infection)     Current Outpatient Medications  Medication Sig Dispense Refill   albuterol (PROVENTIL HFA;VENTOLIN HFA) 108 (90 Base) MCG/ACT inhaler Inhale into the lungs.     baclofen (LIORESAL) 10 MG tablet Take 10 mg by mouth as needed for muscle spasms.     buPROPion (WELLBUTRIN XL) 300 MG 24 hr tablet Take 1 tablet (300 mg total) by mouth daily. 90 tablet 0   cetirizine (ZYRTEC) 10 MG tablet Take 10 mg by mouth daily.     Cholecalciferol (VITAMIN D3) 125 MCG (5000 UT) CAPS Take by mouth.     citalopram (CELEXA) 40 MG tablet TAKE 1 TABLET DAILY 90 tablet 3   levothyroxine (SYNTHROID) 75 MCG tablet TAKE 1 TABLET DAILY 90 tablet 2    montelukast (SINGULAIR) 10 MG tablet Take 1 tablet (10 mg total) by mouth at bedtime. 90 tablet 3   pantoprazole (PROTONIX) 40 MG tablet Take 1 tablet (40 mg total) by mouth daily. 90 tablet 2   pregabalin (LYRICA) 75 MG capsule Take 1 capsule (75 mg total) by mouth 2 (two) times daily. 180 capsule 0   rosuvastatin (CRESTOR) 10 MG tablet Take 1 tablet (10 mg total) by mouth daily. 90 tablet 0   topiramate (TOPAMAX) 50 MG tablet 50 mg daily.     traZODone (DESYREL) 50 MG tablet Take 1-2 tablets (50-100 mg total) by mouth at bedtime as needed for sleep. 60 tablet 0   triamcinolone cream (KENALOG) 0.1 % APPLY 1 APPLICATION TOPICALLY TWICE A DAY AS NEEDED. AVOID FACE, GROIN, AXILLA 30 g 0   vitamin B-12 (CYANOCOBALAMIN) 1000 MCG tablet Take 50 mcg by mouth every other day.      No current facility-administered medications for this visit.    Allergies  Allergen Reactions   Bactrim [Sulfamethoxazole-Trimethoprim] Hives   Gabapentin Other (See Comments)   Tramadol Itching    Family History  Problem Relation Age of Onset   Drug abuse Sister    Alcohol abuse  Sister    Depression Sister    Rheum arthritis Father    Congestive Heart Failure Father    Depression Father    Diabetes Father    Hearing loss Father    Osteoarthritis Mother    Stroke Mother    Aneurysm Mother    Breast cancer Maternal Aunt    Heart attack Maternal Grandfather    Diabetes Brother    Alcohol abuse Paternal Uncle    Arthritis Sister    Depression Sister    Heart disease Sister    Crohn's disease Sister     Social History   Socioeconomic History   Marital status: Married    Spouse name: Not on file   Number of children: Not on file   Years of education: Not on file   Highest education level: Not on file  Occupational History   Not on file  Tobacco Use   Smoking status: Never   Smokeless tobacco: Never  Vaping Use   Vaping Use: Never used  Substance and Sexual Activity   Alcohol use: No   Drug  use: No   Sexual activity: Yes    Comment: Perimenopausal  Other Topics Concern   Not on file  Social History Narrative   Not on file   Social Determinants of Health   Financial Resource Strain: Not on file  Food Insecurity: Not on file  Transportation Needs: Not on file  Physical Activity: Not on file  Stress: Not on file  Social Connections: Not on file  Intimate Partner Violence: Not on file     Constitutional: Patient reports fatigue.  Denies fever, malaise, headache or abrupt weight changes.  HEENT: Denies eye pain, eye redness, ear pain, ringing in the ears, wax buildup, runny nose, nasal congestion, bloody nose, or sore throat. Respiratory: Denies difficulty breathing, shortness of breath, cough or sputum production.   Cardiovascular: Denies chest pain, chest tightness, palpitations or swelling in the hands or feet.  Neurological: Patient reports insomnia, snoring, excessive daytime sleepiness.  Denies dizziness, difficulty with memory, difficulty with speech or problems with balance and coordination.  Psych: Patient has a history of anxiety and depression.  Denies SI/HI.  No other specific complaints in a complete review of systems (except as listed in HPI above).  Objective:   Physical Exam   BP 105/64 (BP Location: Right Arm, Patient Position: Sitting, Cuff Size: Normal)   Pulse 75   Temp (!) 97 F (36.1 C) (Temporal)   Resp 17   Ht '4\' 11"'$  (1.499 m)   Wt 149 lb (67.6 kg)   LMP 05/08/2015 Comment: neg preg  SpO2 100%   BMI 30.09 kg/m   Wt Readings from Last 3 Encounters:  02/19/21 152 lb (68.9 kg)  01/26/21 145 lb (65.8 kg)  12/11/20 145 lb (65.8 kg)    General: Appears her stated age, obese, in NAD. HEENT: Head: normal shape and size; Eyes: sclera white and EOMs intact;  Cardiovascular: Normal rate. Pulmonary/Chest: Normal effort.  Neurological: Alert and oriented.  Psychiatric: Mood and affect flat. Behavior is normal. Judgment and thought content  normal.    BMET    Component Value Date/Time   NA 143 04/11/2020 1205   NA 139 05/29/2014 0516   K 4.2 04/11/2020 1205   K 3.6 05/29/2014 0516   CL 110 04/11/2020 1205   CL 104 05/29/2014 0516   CO2 26 04/11/2020 1205   CO2 24 05/29/2014 0516   GLUCOSE 84 04/11/2020 1205   GLUCOSE  130 (H) 05/29/2014 0516   BUN 10 04/11/2020 1205   BUN 7 05/29/2014 0516   CREATININE 0.92 04/11/2020 1205   CREATININE 0.87 09/11/2018 1628   CALCIUM 9.5 04/11/2020 1205   CALCIUM 8.6 05/29/2014 0516   GFRNONAA 58 (L) 01/17/2020 1346   GFRNONAA >60 05/29/2014 0516   GFRNONAA >60 06/28/2013 0511   GFRAA >60 01/17/2020 1346   GFRAA >60 05/29/2014 0516   GFRAA >60 06/28/2013 0511    Lipid Panel     Component Value Date/Time   CHOL 122 04/11/2020 1205   TRIG 107.0 04/11/2020 1205   HDL 53.60 04/11/2020 1205   CHOLHDL 2 04/11/2020 1205   VLDL 21.4 04/11/2020 1205   LDLCALC 47 04/11/2020 1205   LDLCALC 109 (H) 09/11/2018 1628    CBC    Component Value Date/Time   WBC 7.4 04/11/2020 1205   RBC 4.68 04/11/2020 1205   HGB 13.7 04/11/2020 1205   HGB 13.9 05/29/2014 0516   HCT 40.5 04/11/2020 1205   HCT 41.3 05/29/2014 0516   PLT 233.0 04/11/2020 1205   PLT 227 05/29/2014 0516   MCV 86.6 04/11/2020 1205   MCV 89 05/29/2014 0516   MCH 30.4 06/16/2016 1828   MCHC 33.7 04/11/2020 1205   RDW 13.5 04/11/2020 1205   RDW 13.2 05/29/2014 0516   LYMPHSABS 1.9 12/30/2014 1425   LYMPHSABS 1.8 05/29/2014 0516   MONOABS 0.7 12/30/2014 1425   MONOABS 1.1 (H) 05/29/2014 0516   EOSABS 0.2 12/30/2014 1425   EOSABS 0.2 05/29/2014 0516   BASOSABS 0.1 12/30/2014 1425   BASOSABS 0.1 05/29/2014 0516    Hgb A1C Lab Results  Component Value Date   HGBA1C 5.6 04/11/2020          Assessment & Plan:   Chronic Fatigue:  We will check CBC, TSH, vitamin D and B12 today  Insomnia, Excessive Daytime Sleepiness:  Neck circumference: 13.5 ESS score of 17 Referral to pulmonology for sleep  study Continue Trazodone for now  RTC in 3 months for your annual exam [name], NP This visit occurred during the SARS-CoV-2 public health emergency.  Safety protocols were in place, including screening questions prior to the visit, additional usage of staff PPE, and extensive cleaning of exam room while observing appropriate contact time as indicated for disinfecting solutions.

## 2021-03-06 NOTE — Assessment & Plan Note (Signed)
Encourage diet and exercise for weight loss 

## 2021-03-06 NOTE — Assessment & Plan Note (Signed)
Persistent, not well controlled This could be contributing to her fatigue and sleepiness Will D/C Citalopram Rx for Venlafaxine 150 mg p.o. daily Continue Bupropion

## 2021-03-06 NOTE — Patient Instructions (Signed)
Sleep Apnea Sleep apnea affects breathing during sleep. It causes breathing to stop for 10 seconds or more, or to become shallow. People with sleep apnea usually snoreloudly. It can also increase the risk of: Heart attack. Stroke. Being very overweight (obese). Diabetes. Heart failure. Irregular heartbeat. High blood pressure. The goal of treatment is to help you breathe normally again. What are the causes?  The most common cause of this condition is a collapsed or blocked airway. There are three kinds of sleep apnea: Obstructive sleep apnea. This is caused by a blocked or collapsed airway. Central sleep apnea. This happens when the brain does not send the right signals to the muscles that control breathing. Mixed sleep apnea. This is a combination of obstructive and central sleep apnea. What increases the risk? Being overweight. Smoking. Having a small airway. Being older. Being female. Drinking alcohol. Taking medicines to calm yourself (sedatives or tranquilizers). Having family members with the condition. Having a tongue or tonsils that are larger than normal. What are the signs or symptoms? Trouble staying asleep. Loud snoring. Headaches in the morning. Waking up gasping. Dry mouth or sore throat in the morning. Being sleepy or tired during the day. If you are sleepy or tired during the day, you may also: Not be able to focus your mind (concentrate). Forget things. Get angry a lot and have mood swings. Feel sad (depressed). Have changes in your personality. Have less interest in sex, if you are female. Be unable to have an erection, if you are female. How is this treated?  Sleeping on your side. Using a medicine to get rid of mucus in your nose (decongestant). Avoiding the use of alcohol, medicines to help you relax, or certain pain medicines (narcotics). Losing weight, if needed. Changing your diet. Quitting smoking. Using a machine to open your airway while you  sleep, such as: An oral appliance. This is a mouthpiece that shifts your lower jaw forward. A CPAP device. This device blows air through a mask when you breathe out (exhale). An EPAP device. This has valves that you put in each nostril. A BPAP device. This device blows air through a mask when you breathe in (inhale) and breathe out. Having surgery if other treatments do not work. Follow these instructions at home: Lifestyle Make changes that your doctor recommends. Eat a healthy diet. Lose weight if needed. Avoid alcohol, medicines to help you relax, and some pain medicines. Do not smoke or use any products that contain nicotine or tobacco. If you need help quitting, ask your doctor. General instructions Take over-the-counter and prescription medicines only as told by your doctor. If you were given a machine to use while you sleep, use it only as told by your doctor. If you are having surgery, make sure to tell your doctor you have sleep apnea. You may need to bring your device with you. Keep all follow-up visits. Contact a doctor if: The machine that you were given to use during sleep bothers you or does not seem to be working. You do not get better. You get worse. Get help right away if: Your chest hurts. You have trouble breathing in enough air. You have an uncomfortable feeling in your back, arms, or stomach. You have trouble talking. One side of your body feels weak. A part of your face is hanging down. These symptoms may be an emergency. Get help right away. Call your local emergency services (911 in the [place] Do not wait to see if the symptoms   will go away. Do not drive yourself to the hospital. Summary This condition affects breathing during sleep. The most common cause is a collapsed or blocked airway. The goal of treatment is to help you breathe normally while you sleep. This information is not intended to replace advice given to you by your health care provider. Make  sure you discuss any questions you have with your healthcare provider. Document Revised: 07/07/2020 Document Reviewed: 07/07/2020 Elsevier Patient Education  2022 Elsevier Inc.  

## 2021-03-07 ENCOUNTER — Encounter: Payer: Self-pay | Admitting: Primary Care

## 2021-03-07 ENCOUNTER — Ambulatory Visit (INDEPENDENT_AMBULATORY_CARE_PROVIDER_SITE_OTHER): Admitting: Primary Care

## 2021-03-07 DIAGNOSIS — F5104 Psychophysiologic insomnia: Secondary | ICD-10-CM

## 2021-03-07 DIAGNOSIS — R0683 Snoring: Secondary | ICD-10-CM

## 2021-03-07 LAB — CBC
HCT: 42.4 % (ref 35.0–45.0)
Hemoglobin: 13.8 g/dL (ref 11.7–15.5)
MCH: 29.4 pg (ref 27.0–33.0)
MCHC: 32.5 g/dL (ref 32.0–36.0)
MCV: 90.4 fL (ref 80.0–100.0)
MPV: 10.2 fL (ref 7.5–12.5)
Platelets: 225 10*3/uL (ref 140–400)
RBC: 4.69 10*6/uL (ref 3.80–5.10)
RDW: 12.5 % (ref 11.0–15.0)
WBC: 6.8 10*3/uL (ref 3.8–10.8)

## 2021-03-07 LAB — VITAMIN D 25 HYDROXY (VIT D DEFICIENCY, FRACTURES): Vit D, 25-Hydroxy: 78 ng/mL (ref 30–100)

## 2021-03-07 LAB — TSH: TSH: 1.45 mIU/L

## 2021-03-07 LAB — VITAMIN B12: Vitamin B-12: 524 pg/mL (ref 200–1100)

## 2021-03-07 NOTE — Progress Notes (Signed)
$'@Patient'W$  ID: Grace Shepard, female    DOB: [birth year], 55 y.o.   MRN: [id number]  No chief complaint on file.   Referring provider: [name], NP  HPI: 55 year old female, never smoked.  Past medical history significant for hypothyroidism, GERD, chronic pain syndrome, hyperlipidemia, depression, insomnia, obesity.  Patient was referred to [facility] pulmonary by her primary care for sleep consult d/t excessive daytime sleepiness.  03/07/2021 Patient presents today for sleep consult. She was seen by her PCP yesterday and reported chronic fatigue symptoms along with insomnia. She was previously on Ambien but weaned her self off of this d/t side effects/ She is currently taking '25mg'$  of Trazodone at night. She does not feel well rested in the morning. She has additional symptoms or snoring and wakes up gasping for air/choking. She reports family history of sleep apnea and narcolepsy in her Aunt. She denies symptoms of narcolepsy, sleep walking or cataplexy. Depression/anxiety not currently well controlled on Citalopram, this was discontinued yesterday and she was started on Venlafaxine '150mg'$  daily along with Bupropion.    Sleep questionnaire Prior sleep study- Yes, patient is unsure date/location  Symptoms- Snoring, waking up gasping for air/choking, daytime sleepiness, non-restorative sleep  Bedtime- Varies; average between 11-11:30pm  Time to fall asleep- <15 mins  Nocturnal awakenings- 5 times  Out of bed in morning- 9-10:30am  Weight changes- up 10 lbs  Epworth- 17   Allergies  Allergen Reactions   Bactrim [Sulfamethoxazole-Trimethoprim] Hives   Gabapentin Other (See Comments)   Tramadol Itching    Immunization History  Administered Date(s) Administered   Influenza,inj,Quad PF,6+ Mos 07/19/2015, 05/20/2016, 06/13/2017   Tdap 06/15/2016    Past Medical History:  Diagnosis Date   Allergic rhinitis    Arthritis    Depression    Fibromyalgia    Granuloma annulare  12/10/2019   Bx proven. Left med. breast.   Hyperlipidemia    Ischemic colitis ([place])    Migraine    Thyroid disease    UTI (lower urinary tract infection)     Tobacco History: Social History   Tobacco Use  Smoking Status Never  Smokeless Tobacco Never   Counseling given: Not Answered   Outpatient Medications Prior to Visit  Medication Sig Dispense Refill   albuterol (PROVENTIL HFA;VENTOLIN HFA) 108 (90 Base) MCG/ACT inhaler Inhale into the lungs.     APPLE CIDER VINEGAR PO Take by mouth.     baclofen (LIORESAL) 10 MG tablet Take 10 mg by mouth as needed for muscle spasms.     buPROPion (WELLBUTRIN XL) 300 MG 24 hr tablet Take 1 tablet (300 mg total) by mouth daily. 90 tablet 0   cetirizine (ZYRTEC) 10 MG tablet Take 10 mg by mouth daily.     Cholecalciferol (VITAMIN D3) 125 MCG (5000 UT) CAPS Take by mouth.     estradiol (ESTRACE) 0.1 MG/GM vaginal cream      levothyroxine (SYNTHROID) 75 MCG tablet TAKE 1 TABLET DAILY 90 tablet 2   montelukast (SINGULAIR) 10 MG tablet Take 1 tablet (10 mg total) by mouth at bedtime. 90 tablet 3   pantoprazole (PROTONIX) 40 MG tablet Take 1 tablet (40 mg total) by mouth daily. 90 tablet 2   pregabalin (LYRICA) 75 MG capsule Take 1 capsule (75 mg total) by mouth 2 (two) times daily. 180 capsule 0   rosuvastatin (CRESTOR) 10 MG tablet Take 1 tablet (10 mg total) by mouth daily. 90 tablet 0   topiramate (TOPAMAX) 50 MG tablet 50 mg daily.  traZODone (DESYREL) 50 MG tablet Take 1-2 tablets (50-100 mg total) by mouth at bedtime as needed for sleep. (Patient taking differently: Take 25 mg by mouth at bedtime as needed for sleep.) 60 tablet 0   triamcinolone cream (KENALOG) 0.1 % APPLY 1 APPLICATION TOPICALLY TWICE A DAY AS NEEDED. AVOID FACE, GROIN, AXILLA 30 g 0   venlafaxine XR (EFFEXOR XR) 150 MG 24 hr capsule Take 1 capsule (150 mg total) by mouth daily with breakfast. 30 capsule 2   vitamin B-12 (CYANOCOBALAMIN) 1000 MCG tablet Take 50 mcg by  mouth every other day.      No facility-administered medications prior to visit.   Review of Systems  Review of Systems  Constitutional:  Positive for fatigue.  Respiratory: Negative.    Psychiatric/Behavioral:  Positive for sleep disturbance.     Physical Exam  BP 122/78 (BP Location: Left Arm, Patient Position: Sitting, Cuff Size: Normal)   Pulse 84   Temp (!) 96.8 F (36 C) (Oral)   Ht 4' 10.5" (1.486 m)   Wt 149 lb 9.6 oz (67.9 kg)   LMP 05/08/2015 Comment: neg preg  SpO2 97%   BMI 30.73 kg/m  Physical Exam Constitutional:      Appearance: Normal appearance.  HENT:     Head: Normocephalic and atraumatic.  Cardiovascular:     Rate and Rhythm: Normal rate and regular rhythm.  Pulmonary:     Effort: Pulmonary effort is normal.     Breath sounds: Normal breath sounds.  Skin:    General: Skin is warm and dry.  Neurological:     General: No focal deficit present.     Mental Status: She is alert and oriented to person, place, and time. Mental status is at baseline.  Psychiatric:        Mood and Affect: Mood normal.        Behavior: Behavior normal.        Thought Content: Thought content normal.        Judgment: Judgment normal.     Lab Results:  CBC    Component Value Date/Time   WBC 6.8 03/06/2021 1035   RBC 4.69 03/06/2021 1035   HGB 13.8 03/06/2021 1035   HGB 13.9 05/29/2014 0516   HCT 42.4 03/06/2021 1035   HCT 41.3 05/29/2014 0516   PLT 225 03/06/2021 1035   PLT 227 05/29/2014 0516   MCV 90.4 03/06/2021 1035   MCV 89 05/29/2014 0516   MCH 29.4 03/06/2021 1035   MCHC 32.5 03/06/2021 1035   RDW 12.5 03/06/2021 1035   RDW 13.2 05/29/2014 0516   LYMPHSABS 1.9 12/30/2014 1425   LYMPHSABS 1.8 05/29/2014 0516   MONOABS 0.7 12/30/2014 1425   MONOABS 1.1 (H) 05/29/2014 0516   EOSABS 0.2 12/30/2014 1425   EOSABS 0.2 05/29/2014 0516   BASOSABS 0.1 12/30/2014 1425   BASOSABS 0.1 05/29/2014 0516    BMET    Component Value Date/Time   NA 143  04/11/2020 1205   NA 139 05/29/2014 0516   K 4.2 04/11/2020 1205   K 3.6 05/29/2014 0516   CL 110 04/11/2020 1205   CL 104 05/29/2014 0516   CO2 26 04/11/2020 1205   CO2 24 05/29/2014 0516   GLUCOSE 84 04/11/2020 1205   GLUCOSE 130 (H) 05/29/2014 0516   BUN 10 04/11/2020 1205   BUN 7 05/29/2014 0516   CREATININE 0.92 04/11/2020 1205   CREATININE 0.87 09/11/2018 1628   CALCIUM 9.5 04/11/2020 1205   CALCIUM 8.6  05/29/2014 0516   GFRNONAA 58 (L) 01/17/2020 1346   GFRNONAA >60 05/29/2014 0516   GFRNONAA >60 06/28/2013 0511   GFRAA >60 01/17/2020 1346   GFRAA >60 05/29/2014 0516   GFRAA >60 06/28/2013 0511    BNP No results found for: BNP  ProBNP No results found for: PROBNP  Imaging: No results found.   Assessment & Plan:   Snoring - Patient reports symptoms of snoring, waking up choking, restless sleep and daytime sleepiness. She had a previous sleep study awhile back, results are not available. She does not carry a diagnosis of sleep apnea and has never been on CPAP therapy. Recommend checking home sleep study to rule out obstructive sleep apnea as cause of her fatigue. We discussed how untreated sleep apnea can increase patients risk for cardiac arrythmias, stroke, pulmonary HTN and diabetes. We briefly reviewed treatment options for sleep apnea including side sleeping position, oral appliance, CPAP therapy or referral to ENT for possible surgical options. Depression/anxiety likely contributing to poor sleep quality and chronic fatigue symptoms. Recommend she get some daily exercise and early morning sunlight. Fu in 4-6 weeks to review test results.   Insomnia - She has no issues falling asleep, wakes up several times a night. She has weaned off Ambien CR d/t unwanted side effects, she is currently taking Trazodone '25mg'$  at bedtime and appears to be tolerating this well.    Martyn Ehrich, NP 03/07/2021

## 2021-03-07 NOTE — Assessment & Plan Note (Addendum)
-   Patient reports symptoms of snoring, waking up choking, restless sleep and daytime sleepiness. She had a previous sleep study awhile back, results are not available. She does not carry a diagnosis of sleep apnea and has never been on CPAP therapy. Recommend checking home sleep study to rule out obstructive sleep apnea as cause of her fatigue. We discussed how untreated sleep apnea can increase patients risk for cardiac arrythmias, stroke, pulmonary HTN and diabetes. We briefly reviewed treatment options for sleep apnea including side sleeping position, oral appliance, CPAP therapy or referral to ENT for possible surgical options. Depression/anxiety likely contributing to poor sleep quality and chronic fatigue symptoms. Recommend she get some daily exercise and early morning sunlight. Fu in 4-6 weeks to review test results.

## 2021-03-07 NOTE — Assessment & Plan Note (Addendum)
-   She has no issues falling asleep, wakes up several times a night. She has weaned off Ambien CR d/t unwanted side effects, she is currently taking Trazodone '25mg'$  at bedtime and appears to be tolerating this well.

## 2021-03-07 NOTE — Patient Instructions (Addendum)
Untreated sleep apnea puts you at higher for cardiac arrythmias, stroke, pulmonary HTN and diabetes  Treatment options for sleep apnea include weight loss, oral appliance, CPAP or referral to ENT for possible surgical options  Orders: Home sleep study re: snoring/daytime fatigue  Follow-up: 4-6 weeks to review sleep study results

## 2021-03-12 ENCOUNTER — Other Ambulatory Visit: Payer: Self-pay | Admitting: Internal Medicine

## 2021-03-12 ENCOUNTER — Encounter: Payer: Self-pay | Admitting: Internal Medicine

## 2021-03-12 DIAGNOSIS — E7800 Pure hypercholesterolemia, unspecified: Secondary | ICD-10-CM

## 2021-03-12 NOTE — Progress Notes (Signed)
Reviewed and agree with assessment/plan.   Chesley Mires, MD [facility] Pulmonary/Critical Care 03/12/2021, 8:28 AM Pager:  [phone]

## 2021-03-13 MED ORDER — PAROXETINE HCL 10 MG PO TABS: 10.0000 mg | ORAL_TABLET | Freq: Every day | ORAL | 2 refills | Status: DC

## 2021-03-14 ENCOUNTER — Encounter: Admitting: Internal Medicine

## 2021-03-16 ENCOUNTER — Other Ambulatory Visit: Payer: Self-pay | Admitting: Internal Medicine

## 2021-03-16 NOTE — Telephone Encounter (Signed)
Requested medication (s) are due for refill today: No  Requested medication (s) are on the active medication list: Yes  Last refill:  02/19/21  Future visit scheduled: Yes  Notes to clinic:  Pharmacy requesting 90 day supply.    Requested Prescriptions  Pending Prescriptions Disp Refills   traZODone (DESYREL) 50 MG tablet [Pharmacy Med Name: TRAZODONE 50 MG TABLET] 180 tablet 1    Sig: TAKE 1-2 TABLETS BY MOUTH AT BEDTIME AS NEEDED FOR SLEEP.      Psychiatry: Antidepressants - Serotonin Modulator Passed - 03/16/2021  8:47 AM      Passed - Completed PHQ-2 or PHQ-9 in the last 360 days      Passed - Valid encounter within last 6 months    Recent Outpatient Visits           1 week ago Excessive daytime sleepiness   [facility] [place], Coralie Keens, NP   3 weeks ago Psychophysiological insomnia   [facility] [place], Coralie Keens, NP   3 months ago Postmenopausal atrophic vaginitis   [facility] [place], Coralie Keens, NP       Future Appointments             In 1 month [place], Coralie Keens, NP [facility], [facility]

## 2021-04-04 ENCOUNTER — Other Ambulatory Visit: Payer: Self-pay | Admitting: Internal Medicine

## 2021-04-04 NOTE — Telephone Encounter (Signed)
  Notes to clinic:  REQUEST FOR 90 DAYS PRESCRIPTION.   Requested Prescriptions  Pending Prescriptions Disp Refills   PARoxetine (PAXIL) 10 MG tablet [Pharmacy Med Name: PAROXETINE HCL 10 MG TABLET] 90 tablet 1    Sig: TAKE 1 TABLET BY MOUTH EVERY DAY     Psychiatry:  Antidepressants - SSRI Passed - 04/04/2021 11:36 AM      Passed - Completed PHQ-2 or PHQ-9 in the last 360 days      Passed - Valid encounter within last 6 months    Recent Outpatient Visits           4 weeks ago Excessive daytime sleepiness   [facility] [place], Coralie Keens, NP   1 month ago Psychophysiological insomnia   [facility] [place], [place] W, NP   3 months ago Postmenopausal atrophic vaginitis   [facility] [place], Coralie Keens, NP       Future Appointments             In 2 weeks Garnette Gunner, Coralie Keens, NP [facility], [facility]

## 2021-04-19 ENCOUNTER — Telehealth: Payer: Self-pay | Admitting: Pain Medicine

## 2021-04-19 NOTE — Telephone Encounter (Signed)
She needs an appointment for an eval.

## 2021-04-23 ENCOUNTER — Encounter: Admitting: Internal Medicine

## 2021-04-25 ENCOUNTER — Other Ambulatory Visit: Payer: Self-pay

## 2021-04-25 ENCOUNTER — Ambulatory Visit

## 2021-04-25 DIAGNOSIS — R0683 Snoring: Secondary | ICD-10-CM

## 2021-04-25 DIAGNOSIS — G4733 Obstructive sleep apnea (adult) (pediatric): Secondary | ICD-10-CM | POA: Diagnosis not present

## 2021-04-26 ENCOUNTER — Encounter: Payer: Self-pay | Admitting: Pain Medicine

## 2021-04-26 DIAGNOSIS — G4733 Obstructive sleep apnea (adult) (pediatric): Secondary | ICD-10-CM | POA: Diagnosis not present

## 2021-04-28 NOTE — Progress Notes (Signed)
Patient: Grace Shepard  Service Category: E/M  Provider: Gaspar Cola, MD  DOB: March 30, 1966  DOS: 04/30/2021  Location: Office  MRN: [id number]  Setting: Ambulatory outpatient  Referring Provider: [name], NP  Type: Established Patient  Specialty: Interventional Pain Management  PCP: [name], NP  Location: Remote location  Delivery: TeleHealth     Virtual Encounter - Pain Management PROVIDER NOTE: Information contained herein reflects review and annotations entered in association with encounter. Interpretation of such information and data should be left to medically-trained personnel. Information provided to patient can be located elsewhere in the medical record under "Patient Instructions". Document created using STT-dictation technology, any transcriptional errors that may result from process are unintentional.    Contact & Pharmacy Preferred: [phone] Home: [phone] (home) Mobile: [phone] (mobile) E-mail: [email or web address]  [place], [place] [address] [place] [address] Phone: [phone] Fax: [phone]  [id number] [place], [place] [address] [id number]: [phone]: [phone]  Pre-screening  [name] "in-person" vs "virtual" encounter. She indicated preferring virtual for this encounter.   Reason COVID-19*  Social distancing based on CDC and AMA recommendations.   I contacted Grace Shepard 04/30/2021 via telephone.      I clearly identified myself as FGaspar Cola MD. I verified that I was speaking with the correct person using two identifiers (Name: Grace Shepard and date of birth: [birth year].  Consent I sought verbal advanced consent from Grace Shepard virtual visit interactions. I informed Ms. Grace Newportof possible security and privacy concerns, risks, and limitations associated with  providing "not-in-person" medical evaluation and management services. I also informed Ms. Grace Newportof the availability of "in-person" appointments. Finally, I informed her that there would be a charge for the virtual visit and that she could be  personally, fully or partially, financially responsible for it. Grace Shepard understanding and agreed to proceed.   Historic Elements   [name] a 55y.o. year old, female patient evaluated today after our last contact on 04/19/2021. 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 She also  has a past surgical history that includes Tubal ligation. Ms. WLahmannhas a current medication list which includes the following prescription(s): albuterol, baclofen, bupropion, cetirizine, vitamin d3, estradiol, levothyroxine, montelukast, pantoprazole, paroxetine, pregabalin, rosuvastatin, topiramate, trazodone, triamcinolone cream, vitamin b-12, and topiramate. She  reports that she has never smoked. She has never used smokeless tobacco. She reports that she does not drink alcohol and does not use drugs. [name] allergic to bactrim [sulfamethoxazole-trimethoprim], gabapentin, and tramadol.   HPI  Today, she is being contacted for worsening of previously known (established) problem.  Encounter requested by patient to discuss possibility of an interventional procedure.  The patient indicated having started an exercise program that has increased some of her pain, especially after she did some stretching exercises.  She redact refers that the pain seems to be in the right PSIS region/lateral leg region and what appears to be a sacroiliitis/trochanteric bursitis.  We will be scheduling her to come in for a possible repeat right-sided sacroiliac joint block using the lower  approach.  She refers that this worked better than the upper approach.  When  she comes then we will also evaluate the area of the trochanteric bursa to see if an injection in that area is also needed.  Pharmacotherapy Assessment   Analgesic: None. No opioid analgesics prescribed by our practice. MME/day: 0 mg/day   Monitoring: [place] PMP: PDMP reviewed during this encounter.       Pharmacotherapy: No side-effects or adverse reactions reported. Compliance: No problems identified. Effectiveness: Clinically acceptable. Plan: Refer to "POC". UDS: No results found for: SUMMARY   Laboratory Chemistry Profile   Renal Lab Results  Component Value Date   BUN 10 04/11/2020   CREATININE 0.92 24/04/7352   BCR NOT APPLICABLE 29/92/4268   GFR 64.09 04/11/2020   GFRAA >60 01/17/2020   GFRNONAA 58 (L) 01/17/2020    Hepatic Lab Results  Component Value Date   AST 16 04/11/2020   ALT 18 04/11/2020   ALBUMIN 4.5 04/11/2020   ALKPHOS 106 04/11/2020   LIPASE 24.0 06/15/2015    Electrolytes Lab Results  Component Value Date   NA 143 04/11/2020   K 4.2 04/11/2020   CL 110 04/11/2020   CALCIUM 9.5 04/11/2020   MG 2.2 01/17/2020    Bone Lab Results  Component Value Date   VD25OH 78 03/06/2021    Inflammation (CRP: Acute Phase) (ESR: Chronic Phase) Lab Results  Component Value Date   CRP 1.4 (H) 01/17/2020   ESRSEDRATE 9 01/17/2020         Note: Above Lab results reviewed.  Imaging  MR FOOT LEFT WO CONTRAST CLINICAL DATA:  Dorsal left foot pain for 6 months. No known injury.  EXAM: MRI OF THE LEFT FOOT WITHOUT CONTRAST  TECHNIQUE: Multiplanar, multisequence MR imaging of the left forefoot was performed. No intravenous contrast was administered.  COMPARISON:  None.  FINDINGS: Bones/Joint/Cartilage  Bone marrow edema within the second metatarsal base with small subchondral fracture along the dorsal aspect (series 10, images 14-15) mild bone marrow edema is also present  within the third metatarsal base and lateral cuneiform without associated fracture line. Osseous alignment is normal without dislocation. Mild osteoarthritis of the first MTP joint with joint space narrowing and small marginal osteophytes. Small marginal subcortical cysts along the medial aspect of the first metatarsal head, which may be degenerative or possibly reflect small erosions (series 8, image 16). There is also mild arthropathy across the tarsometatarsal joints.  Ligaments  Intact Lisfranc ligament (series 9, image 13). Collateral ligaments of the forefoot are intact.  Muscles and Tendons  Intact flexor and extensor tendons without tear or tenosynovitis. Normal muscle bulk and signal intensity without edema, atrophy, or fatty infiltration.  Soft tissues  No intermetatarsal space mass or fluid collection. No soft tissue edema.  IMPRESSION: 1. Small subchondral fracture involving the dorsal aspect of the second metatarsal base with associated bone marrow edema. 2. Mild bone marrow edema within the third metatarsal base and lateral cuneiform without associated fracture line. Findings may be degenerative or reflect stress related changes. 3. Mild osteoarthritis of the first MTP joint and TMT joints of the left foot.  Electronically Signed   By: Davina Poke D.O.   On: 01/27/2021 11:55  Assessment  The primary encounter diagnosis was Chronic low back pain (1ry area of Pain) (Bilateral) (R>L) w/o sciatica. 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  Plan  of Care  Problem-specific:  No problem-specific Assessment & Plan notes found for this encounter.  Ms. KETA VANVALKENBURGH has a current  medication list which includes the following long-term medication(s): albuterol, bupropion, levothyroxine, montelukast, pantoprazole, paroxetine, pregabalin, rosuvastatin, topiramate, trazodone, and topiramate.  Pharmacotherapy (Medications Ordered): No orders of the defined types were placed in this encounter.  Orders:  Orders Placed This Encounter  Procedures   SACROILIAC JOINT INJECTION    Standing Status:   Future    Standing Expiration Date:   07/30/2021    Scheduling Instructions:     Side: Right-sided     Sedation: No Sedation.     Timeframe: ASAP    Order Specific Question:   Where will this procedure be performed?    Answer:   [facility]   HIP INJECTION    Standing Status:   Future    Standing Expiration Date:   07/30/2021    Scheduling Instructions:     Purpose: Therapeutic     Indication: Hip pain 2ry to Trochanteric Burlitis right (M70.61).     Side: Right-sided     Sedation: No Sedation.     Timeframe: As soon as the schedule permits.    Follow-up plan:   Return for (Clinic) procedure: (R) SI BLK #3 (Lower approach) + (R) TBI #1.     Interventional Therapies  Risk  Complexity Considerations:   WNL   Planned  Pending:   Pending further evaluation   Under consideration:   Diagnostic bilateral sacroiliac joint block  Diagnostic bilateral trochanteric bursa injections  Diagnostic bilateral IA hip joint injections  Diagnostic right-sided cervical ESI  Diagnostic right-sided occipital nerve blocks  Possible right occipital nerve RFA  Diagnostic right C2/TON nerve block  Possible right C2/TON RFA    Completed:   Therapeutic right SI joint block x2 (11/21/2020) (first: 100/100/90/90-100) (more benefit when the lower approach is used)  Therapeutic left IA hip injection x2 (11/21/2020)  Therapeutic right trochanteric bursa injection x1 (02/08/2020)  Therapeutic left trochanteric bursa injection x2 (11/21/2020)    Therapeutic  Palliative (PRN) options:    Palliative right SI joint block #3 (lower approach)  Palliative right trochanteric bursa injection #2  Palliative left trochanteric bursa injection #3  Palliative left IA hip injection #3     Recent Visits No visits were found meeting these conditions. Showing recent visits within past 90 days and meeting all other requirements Today's Visits Date Type Provider Dept  04/30/21 Office Visit Milinda Pointer, MD [facility]  Showing today's visits and meeting all other requirements Future Appointments No visits were found meeting these conditions. Showing future appointments within next 90 days and meeting all other requirements I discussed the assessment and treatment plan with the patient. The patient was provided an opportunity to ask questions and all were answered. The patient agreed with the plan and demonstrated an understanding of the instructions.  Patient advised to call back or seek an in-person evaluation if the symptoms or condition worsens.  Duration of encounter: 12 minutes.  Note by: Gaspar Cola, MD Date: 04/30/2021; Time: 3:45 PM

## 2021-04-29 ENCOUNTER — Encounter: Payer: Self-pay | Admitting: Internal Medicine

## 2021-04-30 ENCOUNTER — Ambulatory Visit: Attending: Pain Medicine | Admitting: Pain Medicine

## 2021-04-30 ENCOUNTER — Other Ambulatory Visit: Payer: Self-pay

## 2021-04-30 DIAGNOSIS — M545 Low back pain, unspecified: Secondary | ICD-10-CM

## 2021-04-30 DIAGNOSIS — M7062 Trochanteric bursitis, left hip: Secondary | ICD-10-CM

## 2021-04-30 DIAGNOSIS — G8929 Other chronic pain: Secondary | ICD-10-CM

## 2021-04-30 DIAGNOSIS — M7061 Trochanteric bursitis, right hip: Secondary | ICD-10-CM | POA: Diagnosis not present

## 2021-04-30 DIAGNOSIS — M25551 Pain in right hip: Secondary | ICD-10-CM | POA: Diagnosis not present

## 2021-04-30 DIAGNOSIS — M47898 Other spondylosis, sacral and sacrococcygeal region: Secondary | ICD-10-CM

## 2021-04-30 DIAGNOSIS — M533 Sacrococcygeal disorders, not elsewhere classified: Secondary | ICD-10-CM

## 2021-04-30 DIAGNOSIS — M25552 Pain in left hip: Secondary | ICD-10-CM

## 2021-04-30 NOTE — Patient Instructions (Addendum)
____________________________________________________________________________________________  Pain Prevention Technique  Definition:   A technique used to minimize the effects of an activity known to cause inflammation or swelling, which in turn leads to an increase in pain.  Purpose: To prevent swelling from occurring. It is based on the fact that it is easier to prevent swelling from happening than it is to get rid of it, once it occurs.  Contraindications: Anyone with allergy or hypersensitivity to the recommended medications. Anyone taking anticoagulants (Blood Thinners) (e.g., Coumadin, Warfarin, Plavix, etc.). Patients in Renal Failure.  Technique: Before you undertake an activity known to cause pain, or a flare-up of your chronic pain, and before you experience any pain, do the following:  On a full stomach, take 4 (four) over the counter Ibuprofens '200mg'$  tablets (Motrin), for a total of 800 mg. In addition, take over the counter Magnesium 400 to 500 mg, before doing the activity.  Six (6) hours later, again on a full stomach, repeat the Ibuprofen. That night, take a warm shower and stretch under the running warm water.  This technique may be sufficient to abort the pain and discomfort before it happens. Keep in mind that it takes a lot less medication to prevent swelling than it takes to eliminate it once it occurs.  ____________________________________________________________________________________________  ______________________________________________________________________  Preparing for your procedure (without sedation)  Procedure appointments are limited to planned procedures: No Prescription Refills. No disability issues will be discussed. No medication changes will be discussed.  Instructions: Oral Intake: Do not eat or drink anything for at least 6 hours prior to your procedure. (Exception: Blood Pressure Medication. See below.) Transportation: Unless otherwise  stated by your physician, you may drive yourself after the procedure. Blood Pressure Medicine: Do not forget to take your blood pressure medicine with a sip of water the morning of the procedure. If your Diastolic (lower reading)is above 100 mmHg, elective cases will be cancelled/rescheduled. Blood thinners: These will need to be stopped for procedures. Notify our staff if you are taking any blood thinners. Depending on which one you take, there will be specific instructions on how and when to stop it. Diabetics on insulin: Notify the staff so that you can be scheduled 1st case in the morning. If your diabetes requires high dose insulin, take only  of your normal insulin dose the morning of the procedure and notify the staff that you have done so. Preventing infections: Shower with an antibacterial soap the morning of your procedure.  Build-up your immune system: Take 1000 mg of Vitamin C with every meal (3 times a day) the day prior to your procedure. Antibiotics: Inform the staff if you have a condition or reason that requires you to take antibiotics before dental procedures. Pregnancy: If you are pregnant, call and cancel the procedure. Sickness: If you have a cold, fever, or any active infections, call and cancel the procedure. Arrival: You must be in the facility at least 30 minutes prior to your scheduled procedure. Children: Do not bring any children with you. Dress appropriately: Bring dark clothing that you would not mind if they get stained. Valuables: Do not bring any jewelry or valuables.  Reasons to call and reschedule or cancel your procedure: (Following these recommendations will minimize the risk of a serious complication.) Surgeries: Avoid having procedures within 2 weeks of any surgery. (Avoid for 2 weeks before or after any surgery). Flu Shots: Avoid having procedures within 2 weeks of a flu shots or . (Avoid for 2 weeks before or after  immunizations). Barium: Avoid having a  procedure within 7-10 days after having had a radiological study involving the use of radiological contrast. (Myelograms, Barium swallow or enema study). Heart attacks: Avoid any elective procedures or surgeries for the initial 6 months after a "Myocardial Infarction" (Heart Attack). Blood thinners: It is imperative that you stop these medications before procedures. Let us know if you if you take any blood thinner.  Infection: Avoid procedures during or within two weeks of an infection (including chest colds or gastrointestinal problems). Symptoms associated with infections include: Localized redness, fever, chills, night sweats or profuse sweating, burning sensation when voiding, cough, congestion, stuffiness, runny nose, sore throat, diarrhea, nausea, vomiting, cold or Flu symptoms, recent or current infections. It is specially important if the infection is over the area that we intend to treat. Heart and lung problems: Symptoms that may suggest an active cardiopulmonary problem include: cough, chest pain, breathing difficulties or shortness of breath, dizziness, ankle swelling, uncontrolled high or unusually low blood pressure, and/or palpitations. If you are experiencing any of these symptoms, cancel your procedure and contact your primary care physician for an evaluation.  Remember:  Regular Business hours are:  Monday to Thursday 8:00 AM to 4:00 PM  Provider's Schedule: Milinda Pointer, MD:  Procedure days: Tuesday and Thursday 7:30 AM to 4:00 PM  Gillis Santa, MD:  Procedure days: Monday and Wednesday 7:30 AM to 4:00 PM ______________________________________________________________________  ____________________________________________________________________________________________  General Risks and Possible Complications  Patient Responsibilities: It is important that you read this as it is part of your informed consent. It is our duty to inform you of the risks and possible  complications associated with treatments offered to you. It is your responsibility as a patient to read this and to ask questions about anything that is not clear or that you believe was not covered in this document.  Patient's Rights: You have the right to refuse treatment. You also have the right to change your mind, even after initially having agreed to have the treatment done. However, under this last option, if you wait until the last second to change your mind, you may be charged for the materials used up to that point.  Introduction: Medicine is not an Chief Strategy Officer. Everything in Medicine, including the lack of treatment(s), carries the potential for danger, harm, or loss (which is by definition: Risk). In Medicine, a complication is a secondary problem, condition, or disease that can aggravate an already existing one. All treatments carry the risk of possible complications. The fact that a side effects or complications occurs, does not imply that the treatment was conducted incorrectly. It must be clearly understood that these can happen even when everything is done following the highest safety standards.  No treatment: You can choose not to proceed with the proposed treatment alternative. The "PRO(s)" would include: avoiding the risk of complications associated with the therapy. The "CON(s)" would include: not getting any of the treatment benefits. These benefits fall under one of three categories: diagnostic; therapeutic; and/or palliative. Diagnostic benefits include: getting information which can ultimately lead to improvement of the disease or symptom(s). Therapeutic benefits are those associated with the successful treatment of the disease. Finally, palliative benefits are those related to the decrease of the primary symptoms, without necessarily curing the condition (example: decreasing the pain from a flare-up of a chronic condition, such as incurable terminal cancer).  General Risks and  Complications: These are associated to most interventional treatments. They can occur alone, or in combination.  They fall under one of the following six (6) categories: no benefit or worsening of symptoms; bleeding; infection; nerve damage; allergic reactions; and/or death. No benefits or worsening of symptoms: In Medicine there are no guarantees, only probabilities. No healthcare provider can ever guarantee that a medical treatment will work, they can only state the probability that it may. Furthermore, there is always the possibility that the condition may worsen, either directly, or indirectly, as a consequence of the treatment. Bleeding: This is more common if the patient is taking a blood thinner, either prescription or over the counter (example: Goody Powders, Fish oil, Aspirin, Garlic, etc.), or if suffering a condition associated with impaired coagulation (example: Hemophilia, cirrhosis of the liver, low platelet counts, etc.). However, even if you do not have one on these, it can still happen. If you have any of these conditions, or take one of these drugs, make sure to notify your treating physician. Infection: This is more common in patients with a compromised immune system, either due to disease (example: diabetes, cancer, human immunodeficiency virus [HIV], etc.), or due to medications or treatments (example: therapies used to treat cancer and rheumatological diseases). However, even if you do not have one on these, it can still happen. If you have any of these conditions, or take one of these drugs, make sure to notify your treating physician. Nerve Damage: This is more common when the treatment is an invasive one, but it can also happen with the use of medications, such as those used in the treatment of cancer. The damage can occur to small secondary nerves, or to large primary ones, such as those in the spinal cord and brain. This damage may be temporary or permanent and it may lead to  impairments that can range from temporary numbness to permanent paralysis and/or brain death. Allergic Reactions: Any time a substance or material comes in contact with our body, there is the possibility of an allergic reaction. These can range from a mild skin rash (contact dermatitis) to a severe systemic reaction (anaphylactic reaction), which can result in death. Death: In general, any medical intervention can result in death, most of the time due to an unforeseen complication. ____________________________________________________________________________________________

## 2021-05-01 ENCOUNTER — Encounter: Admitting: Internal Medicine

## 2021-05-02 ENCOUNTER — Telehealth: Payer: Self-pay

## 2021-05-02 ENCOUNTER — Encounter: Payer: Self-pay | Admitting: Pain Medicine

## 2021-05-02 NOTE — Telephone Encounter (Signed)
Copied from [place] [id number]. Topic: General - Other >> Apr 30, 2021  5:00 PM Pawlus, Brayton Layman A wrote: Reason for CRM: Pt requested a call back regarding the MyChart message she sent, if her request is not approved she stated she will have to cancel her upcoming appt and find a new provider.

## 2021-05-08 ENCOUNTER — Encounter: Admitting: Internal Medicine

## 2021-05-08 NOTE — Progress Notes (Deleted)
Subjective:    Patient ID: Grace Shepard, female    DOB: 10-Dec-1965, 55 y.o.   MRN: [id number]  HPI  Pt presents to the clinic today for her annual exam.  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   Hyperlipidemia    Ischemic colitis ([place])    Migraine    Thyroid disease    UTI (lower urinary tract infection)     Current Outpatient Medications  Medication Sig Dispense Refill   albuterol (PROVENTIL HFA;VENTOLIN HFA) 108 (90 Base) MCG/ACT inhaler Inhale into the lungs.     baclofen (LIORESAL) 10 MG tablet Take 10 mg by mouth as needed for muscle spasms.     buPROPion (WELLBUTRIN XL) 300 MG 24 hr tablet Take 1 tablet (300 mg total) by mouth daily. 90 tablet 0   cetirizine (ZYRTEC) 10 MG tablet Take 10 mg by mouth daily.     Cholecalciferol (VITAMIN D3) 125 MCG (5000 UT) CAPS Take 4,000 Units by mouth.     estradiol (ESTRACE) 0.1 MG/GM vaginal cream      levothyroxine (SYNTHROID) 75 MCG tablet TAKE 1 TABLET DAILY 90 tablet 3   montelukast (SINGULAIR) 10 MG tablet Take 1 tablet (10 mg total) by mouth at bedtime. 90 tablet 3   pantoprazole (PROTONIX) 40 MG tablet Take 1 tablet (40 mg total) by mouth daily. 90 tablet 2   PARoxetine (PAXIL) 10 MG tablet TAKE 1 TABLET BY MOUTH EVERY DAY 90 tablet 0   pregabalin (LYRICA) 75 MG capsule Take 1 capsule (75 mg total) by mouth 2 (two) times daily. 180 capsule 0   rosuvastatin (CRESTOR) 10 MG tablet TAKE 1 TABLET DAILY 90 tablet 0   topiramate (TOPAMAX) 100 MG tablet Take 100 mg by mouth daily.     topiramate (TOPAMAX) 50 MG tablet 50 mg daily.     traZODone (DESYREL) 50 MG tablet TAKE 1-2 TABLETS BY MOUTH AT BEDTIME AS NEEDED FOR SLEEP.  180 tablet 0   triamcinolone cream (KENALOG) 0.1 % APPLY 1 APPLICATION TOPICALLY TWICE A DAY AS NEEDED. AVOID FACE, GROIN, AXILLA 30 g 0   vitamin B-12 (CYANOCOBALAMIN) 1000 MCG tablet Take 50 mcg by mouth every other day.      No current facility-administered medications for this visit.    Allergies  Allergen Reactions   Bactrim [Sulfamethoxazole-Trimethoprim] Hives   Gabapentin Other (See Comments)   Tramadol Itching    Family History  Problem Relation Age of Onset   Drug abuse Sister    Alcohol abuse Sister    Depression Sister    Rheum arthritis Father    Congestive Heart Failure Father    Depression Father    Diabetes Father    Hearing loss Father    Osteoarthritis Mother    Stroke Mother    Aneurysm Mother    Breast cancer Maternal Aunt    Heart attack Maternal Grandfather    Diabetes Brother    Alcohol abuse Paternal Uncle    Arthritis Sister    Depression Sister    Heart disease Sister    Crohn's disease Sister     Social History   Socioeconomic History   Marital status: Married  Spouse name: Not on file   Number of children: Not on file   Years of education: Not on file   Highest education level: Not on file  Occupational History   Not on file  Tobacco Use   Smoking status: Never   Smokeless tobacco: Never  Vaping Use   Vaping Use: Never used  Substance and Sexual Activity   Alcohol use: No   Drug use: No   Sexual activity: Yes    Comment: Perimenopausal  Other Topics Concern   Not on file  Social History Narrative   Not on file   Social Determinants of Health   Financial Resource Strain: Not on file  Food Insecurity: Not on file  Transportation Needs: Not on file  Physical Activity: Not on file  Stress: Not on file  Social Connections: Not on file  Intimate Partner Violence: Not on file     Constitutional: Pt reports headaches. Denies fever, malaise, fatigue, or abrupt weight changes.  HEENT: Denies eye pain, eye redness, ear  pain, ringing in the ears, wax buildup, runny nose, nasal congestion, bloody nose, or sore throat. Respiratory: Denies difficulty breathing, shortness of breath, cough or sputum production.   Cardiovascular: Denies chest pain, chest tightness, palpitations or swelling in the hands or feet.  Gastrointestinal: Denies abdominal pain, bloating, constipation, diarrhea or blood in the stool.  GU: Denies urgency, frequency, pain with urination, burning sensation, blood in urine, odor or discharge. Musculoskeletal: Pt reports chronic joint and muscle pain. Denies decrease in range of motion, difficulty with gait, joint swelling.  Skin: Denies redness, rashes, lesions or ulcercations.  Neurological: Pt reports insomnia. Denies dizziness, difficulty with memory, difficulty with speech or problems with balance and coordination.  Psych: Pt has a history of anxiety and depression. Denies SI/HI.  No other specific complaints in a complete review of systems (except as listed in HPI above).  Objective:   Physical Exam    LMP 05/08/2015 Comment: neg preg Wt Readings from Last 3 Encounters:  03/07/21 149 lb 9.6 oz (67.9 kg)  03/06/21 149 lb (67.6 kg)  02/19/21 152 lb (68.9 kg)    General: Appears their stated age, well developed, well nourished in NAD. Skin: Warm, dry and intact. No rashes, lesions or ulcerations noted. HEENT: Head: normal shape and size; Eyes: sclera white, no icterus, conjunctiva pink, PERRLA and EOMs intact; Ears: Tm's gray and intact, normal light reflex; Nose: mucosa pink and moist, septum midline; Throat/Mouth: Teeth present, mucosa pink and moist, no exudate, lesions or ulcerations noted.  Neck:  Neck supple, trachea midline. No masses, lumps or thyromegaly present.  Cardiovascular: Normal rate and rhythm. S1,S2 noted.  No murmur, rubs or gallops noted. No JVD or BLE edema. No carotid bruits noted. Pulmonary/Chest: Normal effort and positive vesicular breath sounds. No respiratory  distress. No wheezes, rales or ronchi noted.  Abdomen: Soft and nontender. Normal bowel sounds. No distention or masses noted. Liver, spleen and kidneys non palpable. Musculoskeletal: Normal range of motion. No signs of joint swelling. No difficulty with gait.  Neurological: Alert and oriented. Cranial nerves II-XII grossly intact. Coordination normal.  Psychiatric: Mood and affect normal. Behavior is normal. Judgment and thought content normal.    BMET    Component Value Date/Time   NA 143 04/11/2020 1205   NA 139 05/29/2014 0516   K 4.2 04/11/2020 1205   K 3.6 05/29/2014 0516   CL 110 04/11/2020 1205   CL 104 05/29/2014 0516   CO2 26 04/11/2020 1205  CO2 24 05/29/2014 0516   GLUCOSE 84 04/11/2020 1205   GLUCOSE 130 (H) 05/29/2014 0516   BUN 10 04/11/2020 1205   BUN 7 05/29/2014 0516   CREATININE 0.92 04/11/2020 1205   CREATININE 0.87 09/11/2018 1628   CALCIUM 9.5 04/11/2020 1205   CALCIUM 8.6 05/29/2014 0516   GFRNONAA 58 (L) 01/17/2020 1346   GFRNONAA >60 05/29/2014 0516   GFRNONAA >60 06/28/2013 0511   GFRAA >60 01/17/2020 1346   GFRAA >60 05/29/2014 0516   GFRAA >60 06/28/2013 0511    Lipid Panel     Component Value Date/Time   CHOL 122 04/11/2020 1205   TRIG 107.0 04/11/2020 1205   HDL 53.60 04/11/2020 1205   CHOLHDL 2 04/11/2020 1205   VLDL 21.4 04/11/2020 1205   LDLCALC 47 04/11/2020 1205   LDLCALC 109 (H) 09/11/2018 1628    CBC    Component Value Date/Time   WBC 6.8 03/06/2021 1035   RBC 4.69 03/06/2021 1035   HGB 13.8 03/06/2021 1035   HGB 13.9 05/29/2014 0516   HCT 42.4 03/06/2021 1035   HCT 41.3 05/29/2014 0516   PLT 225 03/06/2021 1035   PLT 227 05/29/2014 0516   MCV 90.4 03/06/2021 1035   MCV 89 05/29/2014 0516   MCH 29.4 03/06/2021 1035   MCHC 32.5 03/06/2021 1035   RDW 12.5 03/06/2021 1035   RDW 13.2 05/29/2014 0516   LYMPHSABS 1.9 12/30/2014 1425   LYMPHSABS 1.8 05/29/2014 0516   MONOABS 0.7 12/30/2014 1425   MONOABS 1.1 (H)  05/29/2014 0516   EOSABS 0.2 12/30/2014 1425   EOSABS 0.2 05/29/2014 0516   BASOSABS 0.1 12/30/2014 1425   BASOSABS 0.1 05/29/2014 0516    Hgb A1C Lab Results  Component Value Date   HGBA1C 5.6 04/11/2020          Assessment & Plan:   Preventative Health Maintenance:  Flu shot today Tetanus UTD Encouraged covid vaccine She will check with her insurance regarding Shingrix coverage Pap smear UTD Mammogram due 07/2021  Encouraged her to consume a balanced diet and exercise regimen Advised her to see an eye doctor and dentist annually Will check CBC, TSH, CMET, Lipid, A1C, HIV and Hep C today  RTC in 6 months for follow up of chronic conditions [name], NP This visit occurred during the SARS-CoV-2 public health emergency.  Safety protocols were in place, including screening questions prior to the visit, additional usage of staff PPE, and extensive cleaning of exam room while observing appropriate contact time as indicated for disinfecting solutions.

## 2021-05-14 ENCOUNTER — Encounter: Admitting: Internal Medicine

## 2021-05-14 NOTE — Progress Notes (Deleted)
Subjective:    Patient ID: Grace Shepard, female    DOB: [birth year], 55 y.o.   MRN: [id number]  HPI  Pt presents to the clinic today for her annual exam.  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   Hyperlipidemia    Ischemic colitis ([place])    Migraine    Thyroid disease    UTI (lower urinary tract infection)     Current Outpatient Medications  Medication Sig Dispense Refill   albuterol (PROVENTIL HFA;VENTOLIN HFA) 108 (90 Base) MCG/ACT inhaler Inhale into the lungs.     baclofen (LIORESAL) 10 MG tablet Take 10 mg by mouth as needed for muscle spasms.     buPROPion (WELLBUTRIN XL) 300 MG 24 hr tablet Take 1 tablet (300 mg total) by mouth daily. 90 tablet 0   cetirizine (ZYRTEC) 10 MG tablet Take 10 mg by mouth daily.     Cholecalciferol (VITAMIN D3) 125 MCG (5000 UT) CAPS Take 4,000 Units by mouth.     estradiol (ESTRACE) 0.1 MG/GM vaginal cream      levothyroxine (SYNTHROID) 75 MCG tablet TAKE 1 TABLET DAILY 90 tablet 3   montelukast (SINGULAIR) 10 MG tablet Take 1 tablet (10 mg total) by mouth at bedtime. 90 tablet 3   pantoprazole (PROTONIX) 40 MG tablet Take 1 tablet (40 mg total) by mouth daily. 90 tablet 2   PARoxetine (PAXIL) 10 MG tablet TAKE 1 TABLET BY MOUTH EVERY DAY 90 tablet 0   pregabalin (LYRICA) 75 MG capsule Take 1 capsule (75 mg total) by mouth 2 (two) times daily. 180 capsule 0   rosuvastatin (CRESTOR) 10 MG tablet TAKE 1 TABLET DAILY 90 tablet 0   topiramate (TOPAMAX) 100 MG tablet Take 100 mg by mouth daily.     topiramate (TOPAMAX) 50 MG tablet 50 mg daily.     traZODone (DESYREL) 50 MG tablet TAKE 1-2 TABLETS BY MOUTH AT BEDTIME AS NEEDED FOR  SLEEP. 180 tablet 0   triamcinolone cream (KENALOG) 0.1 % APPLY 1 APPLICATION TOPICALLY TWICE A DAY AS NEEDED. AVOID FACE, GROIN, AXILLA 30 g 0   vitamin B-12 (CYANOCOBALAMIN) 1000 MCG tablet Take 50 mcg by mouth every other day.      No current facility-administered medications for this visit.    Allergies  Allergen Reactions   Bactrim [Sulfamethoxazole-Trimethoprim] Hives   Gabapentin Other (See Comments)   Tramadol Itching    Family History  Problem Relation Age of Onset   Drug abuse Sister    Alcohol abuse Sister    Depression Sister    Rheum arthritis Father    Congestive Heart Failure Father    Depression Father    Diabetes Father    Hearing loss Father    Osteoarthritis Mother    Stroke Mother    Aneurysm Mother    Breast cancer Maternal Aunt    Heart attack Maternal Grandfather    Diabetes Brother    Alcohol abuse Paternal Uncle    Arthritis Sister    Depression Sister    Heart disease Sister    Crohn's disease Sister     Social History   Socioeconomic History   Marital status: Married  Spouse name: Not on file   Number of children: Not on file   Years of education: Not on file   Highest education level: Not on file  Occupational History   Not on file  Tobacco Use   Smoking status: Never   Smokeless tobacco: Never  Vaping Use   Vaping Use: Never used  Substance and Sexual Activity   Alcohol use: No   Drug use: No   Sexual activity: Yes    Comment: Perimenopausal  Other Topics Concern   Not on file  Social History Narrative   Not on file   Social Determinants of Health   Financial Resource Strain: Not on file  Food Insecurity: Not on file  Transportation Needs: Not on file  Physical Activity: Not on file  Stress: Not on file  Social Connections: Not on file  Intimate Partner Violence: Not on file     Constitutional: Pt reports headaches. Denies fever, malaise, fatigue, or abrupt weight changes.  HEENT: Denies eye pain, eye redness,  ear pain, ringing in the ears, wax buildup, runny nose, nasal congestion, bloody nose, or sore throat. Respiratory: Denies difficulty breathing, shortness of breath, cough or sputum production.   Cardiovascular: Denies chest pain, chest tightness, palpitations or swelling in the hands or feet.  Gastrointestinal: Denies abdominal pain, bloating, constipation, diarrhea or blood in the stool.  GU: Denies urgency, frequency, pain with urination, burning sensation, blood in urine, odor or discharge. Musculoskeletal: Pt reports chronic joint and muscle pain. Denies decrease in range of motion, difficulty with gait, or joint swelling.  Skin: Denies redness, rashes, lesions or ulcercations.  Neurological: Pt reports insomnia. Denies dizziness, difficulty with memory, difficulty with speech or problems with balance and coordination.  Psych: Pt reports anxiety and depression. Denies SI/HI.  No other specific complaints in a complete review of systems (except as listed in HPI above).     Objective:   Physical Exam   LMP 05/08/2015 Comment: neg preg Wt Readings from Last 3 Encounters:  03/07/21 149 lb 9.6 oz (67.9 kg)  03/06/21 149 lb (67.6 kg)  02/19/21 152 lb (68.9 kg)    General: Appears their stated age, well developed, well nourished in NAD. Skin: Warm, dry and intact. No rashes, lesions or ulcerations noted. HEENT: Head: normal shape and size; Eyes: sclera white, no icterus, conjunctiva pink, PERRLA and EOMs intact; Ears: Tm's gray and intact, normal light reflex; Nose: mucosa pink and moist, septum midline; Throat/Mouth: Teeth present, mucosa pink and moist, no exudate, lesions or ulcerations noted.  Neck:  Neck supple, trachea midline. No masses, lumps or thyromegaly present.  Cardiovascular: Normal rate and rhythm. S1,S2 noted.  No murmur, rubs or gallops noted. No JVD or BLE edema. No carotid bruits noted. Pulmonary/Chest: Normal effort and positive vesicular breath sounds. No respiratory  distress. No wheezes, rales or ronchi noted.  Abdomen: Soft and nontender. Normal bowel sounds. No distention or masses noted. Liver, spleen and kidneys non palpable. Musculoskeletal: Normal range of motion. No signs of joint swelling. No difficulty with gait.  Neurological: Alert and oriented. Cranial nerves II-XII grossly intact. Coordination normal.  Psychiatric: Mood and affect normal. Behavior is normal. Judgment and thought content normal.    BMET    Component Value Date/Time   NA 143 04/11/2020 1205   NA 139 05/29/2014 0516   K 4.2 04/11/2020 1205   K 3.6 05/29/2014 0516   CL 110 04/11/2020 1205   CL 104 05/29/2014 0516   CO2 26 04/11/2020 1205  CO2 24 05/29/2014 0516   GLUCOSE 84 04/11/2020 1205   GLUCOSE 130 (H) 05/29/2014 0516   BUN 10 04/11/2020 1205   BUN 7 05/29/2014 0516   CREATININE 0.92 04/11/2020 1205   CREATININE 0.87 09/11/2018 1628   CALCIUM 9.5 04/11/2020 1205   CALCIUM 8.6 05/29/2014 0516   GFRNONAA 58 (L) 01/17/2020 1346   GFRNONAA >60 05/29/2014 0516   GFRNONAA >60 06/28/2013 0511   GFRAA >60 01/17/2020 1346   GFRAA >60 05/29/2014 0516   GFRAA >60 06/28/2013 0511    Lipid Panel     Component Value Date/Time   CHOL 122 04/11/2020 1205   TRIG 107.0 04/11/2020 1205   HDL 53.60 04/11/2020 1205   CHOLHDL 2 04/11/2020 1205   VLDL 21.4 04/11/2020 1205   LDLCALC 47 04/11/2020 1205   LDLCALC 109 (H) 09/11/2018 1628    CBC    Component Value Date/Time   WBC 6.8 03/06/2021 1035   RBC 4.69 03/06/2021 1035   HGB 13.8 03/06/2021 1035   HGB 13.9 05/29/2014 0516   HCT 42.4 03/06/2021 1035   HCT 41.3 05/29/2014 0516   PLT 225 03/06/2021 1035   PLT 227 05/29/2014 0516   MCV 90.4 03/06/2021 1035   MCV 89 05/29/2014 0516   MCH 29.4 03/06/2021 1035   MCHC 32.5 03/06/2021 1035   RDW 12.5 03/06/2021 1035   RDW 13.2 05/29/2014 0516   LYMPHSABS 1.9 12/30/2014 1425   LYMPHSABS 1.8 05/29/2014 0516   MONOABS 0.7 12/30/2014 1425   MONOABS 1.1 (H)  05/29/2014 0516   EOSABS 0.2 12/30/2014 1425   EOSABS 0.2 05/29/2014 0516   BASOSABS 0.1 12/30/2014 1425   BASOSABS 0.1 05/29/2014 0516    Hgb A1C Lab Results  Component Value Date   HGBA1C 5.6 04/11/2020           Assessment & Plan:  Preventative Health Maintenance:  She declines flu shot Tetanus UTD Encouraged her to get her COVID-vaccine Discussed Shingrix, she will check on this with her insurance Pap smear due 2024 Mammogram due 07/2021 Referral to GI for screening colonoscopy Encouraged her to consume about statin exercise regimen Advised her to see an eye doctor and dentist annually We will check CBC, c-Met, TSH, free T4, lipid, A1c, HIV and hep C today  RTC in 6 months, follow-up chronic conditions  [name], NP This visit occurred during the SARS-CoV-2 public health emergency.  Safety protocols were in place, including screening questions prior to the visit, additional usage of staff PPE, and extensive cleaning of exam room while observing appropriate contact time as indicated for disinfecting solutions.

## 2021-05-15 ENCOUNTER — Other Ambulatory Visit: Payer: Self-pay

## 2021-05-15 ENCOUNTER — Encounter: Payer: Self-pay | Admitting: Family Medicine

## 2021-05-15 ENCOUNTER — Ambulatory Visit (INDEPENDENT_AMBULATORY_CARE_PROVIDER_SITE_OTHER): Admitting: Family Medicine

## 2021-05-15 ENCOUNTER — Telehealth: Payer: Self-pay

## 2021-05-15 VITALS — Ht 58.5 in | Wt 150.0 lb

## 2021-05-15 DIAGNOSIS — B349 Viral infection, unspecified: Secondary | ICD-10-CM | POA: Diagnosis not present

## 2021-05-15 MED ORDER — PREDNISONE 10 MG PO TABS: ORAL_TABLET | ORAL | 0 refills | Status: DC

## 2021-05-15 MED ORDER — IPRATROPIUM BROMIDE 0.06 % NA SOLN: 2.0000 | Freq: Four times a day (QID) | NASAL | 0 refills | Status: DC

## 2021-05-15 NOTE — Progress Notes (Signed)
Virtual Visit via Telephone The purpose of this virtual visit is to provide medical care while limiting exposure to the novel coronavirus (COVID19) for both patient and office staff.  Consent was obtained for phone visit:  Yes.   Answered questions that patient had about telehealth interaction:  Yes.   I discussed the limitations, risks, security and privacy concerns of performing an evaluation and management service by telephone. I also discussed with the patient that there may be a patient responsible charge related to this service. The patient expressed understanding and agreed to proceed.  Patient Location: Home Provider Location: [facility] (Office)  Participants in virtual visit: - Patient: Grace Shepard - CMA: Orinda Kenner, [place] - Provider: Dr Parks Ranger  ---------------------------------------------------------------------- Chief Complaint  Patient presents with   Cough   Chills   Headache    S: Reviewed CMA documentation. I have called patient and gathered additional HPI as follows:  Acute Viral Syndrome Reports that symptoms started 4 days ago with acute onset respiratory symptoms with cough gradual progression, 2 days ago developed headache sinus pressure and congestion. Difficulty sleeping with cough at night. Today woke up again with body aches. Described a deeper "gurgling" cough. Tried OTC 12 hr sudafed, impacting her sleep. No other cough medicines. She has albuterol inhaler but only has borderline asthma. No one else sick in the home. COVID vaccine completed initial series and booster. Has not done a COVID test yet. Not out of work or other obligation  Denies any known or suspected exposure to person with or possibly with [place].  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   Hyperlipidemia    Ischemic colitis ([place])    Migraine    Thyroid disease    UTI (lower urinary tract infection)    Social History   Tobacco Use   Smoking status: Never   Smokeless tobacco: Never  Vaping Use   Vaping Use: Never used  Substance Use Topics   Alcohol use: No   Drug use: No    Current Outpatient Medications:    albuterol (PROVENTIL HFA;VENTOLIN HFA) 108 (90 Base) MCG/ACT inhaler, Inhale into the lungs., Disp: , Rfl:    baclofen (LIORESAL) 10 MG tablet, Take 10 mg by mouth as needed for muscle spasms., Disp: , Rfl:    buPROPion (WELLBUTRIN XL) 300 MG 24 hr tablet, Take 1 tablet (300 mg total) by mouth daily., Disp: 90 tablet, Rfl: 0   cetirizine (ZYRTEC) 10 MG tablet, Take 10 mg by mouth daily., Disp: , Rfl:    Cholecalciferol (VITAMIN D3) 125 MCG (5000 UT) CAPS, Take 4,000 Units by mouth., Disp: , Rfl:    estradiol (ESTRACE) 0.1 MG/GM vaginal cream, , Disp: , Rfl:    ipratropium (ATROVENT) 0.06 % nasal spray, Place 2 sprays into both nostrils 4 (four) times daily. For up to 5-7 days then stop., Disp: 15 mL, Rfl: 0   levothyroxine (SYNTHROID) 75 MCG tablet, TAKE 1 TABLET DAILY, Disp: 90 tablet, Rfl: 3   montelukast (SINGULAIR) 10 MG tablet, Take 1 tablet (10 mg total) by mouth at bedtime., Disp: 90 tablet, Rfl: 3   pantoprazole (PROTONIX) 40 MG tablet, Take 1 tablet (40 mg total) by mouth daily., Disp: 90 tablet,  Rfl: 2   PARoxetine (PAXIL) 10 MG tablet, TAKE 1 TABLET BY MOUTH EVERY DAY, Disp: 90 tablet, Rfl: 0   predniSONE (DELTASONE) 10 MG tablet, Take 6 tabs with breakfast Day 1, 5 tabs Day 2, 4 tabs Day 3, 3 tabs Day 4, 2 tabs Day 5, 1 tab Day 6., Disp: 21 tablet, Rfl: 0   pregabalin (LYRICA) 75 MG capsule, Take 1 capsule (75 mg total) by mouth 2 (two) times daily., Disp: 180 capsule, Rfl: 0   rosuvastatin (CRESTOR) 10 MG tablet, TAKE 1 TABLET DAILY, Disp: 90 tablet, Rfl: 0   topiramate  (TOPAMAX) 100 MG tablet, Take 100 mg by mouth daily., Disp: , Rfl:    topiramate (TOPAMAX) 50 MG tablet, 50 mg daily., Disp: , Rfl:    traZODone (DESYREL) 50 MG tablet, TAKE 1-2 TABLETS BY MOUTH AT BEDTIME AS NEEDED FOR SLEEP., Disp: 180 tablet, Rfl: 0   triamcinolone cream (KENALOG) 0.1 %, APPLY 1 APPLICATION TOPICALLY TWICE A DAY AS NEEDED. AVOID FACE, GROIN, AXILLA, Disp: 30 g, Rfl: 0   vitamin B-12 (CYANOCOBALAMIN) 1000 MCG tablet, Take 50 mcg by mouth every other day. , Disp: , Rfl:   Depression screen [facility] 2/9 03/06/2021 02/19/2021 12/11/2020  Decreased Interest 2 0 0  Down, Depressed, Hopeless 2 1 0  PHQ - 2 Score 4 1 0  Altered sleeping 3 0 0  Tired, decreased energy 3 3 0  Change in appetite 2 3 1   Feeling bad or failure about yourself  1 1 0  Trouble concentrating 2 1 2   Moving slowly or fidgety/restless 3 1 1   Suicidal thoughts 0 0 0  PHQ-9 Score 18 10 4   Difficult doing work/chores Extremely dIfficult Somewhat difficult Not difficult at all    GAD 7 : Generalized Anxiety Score 03/06/2021 12/11/2020 05/20/2016  Nervous, Anxious, on Edge 0 0 1  Control/stop worrying 2 1 2   Worry too much - different things 2 1 2   Trouble relaxing 0 1 3  Restless 0 1 1  Easily annoyed or irritable 0 0 0  Afraid - awful might happen 0 0 0  Total GAD 7 Score 4 4 9   Anxiety Difficulty Not difficult at all - -    -------------------------------------------------------------------------- O: No physical exam performed due to remote telephone encounter.  Lab results reviewed.  Recent Results (from the past 2160 hour(s))  CBC     Status: None   Collection Time: 03/06/21 10:35 AM  Result Value Ref Range   WBC 6.8 3.8 - 10.8 Thousand/uL   RBC 4.69 3.80 - 5.10 Million/uL   Hemoglobin 13.8 11.7 - 15.5 g/dL   HCT 42.4 35.0 - 45.0 %   MCV 90.4 80.0 - 100.0 fL   MCH 29.4 27.0 - 33.0 pg   MCHC 32.5 32.0 - 36.0 g/dL   RDW 12.5 11.0 - 15.0 %   Platelets 225 140 - 400 Thousand/uL   MPV 10.2 7.5 - 12.5  fL  TSH     Status: None   Collection Time: 03/06/21 10:35 AM  Result Value Ref Range   TSH 1.45 mIU/L    Comment:           Reference Range .           > or = 20 Years  0.40-4.50 .                Pregnancy Ranges           First trimester    0.26-2.66  Second trimester   0.55-2.73           Third trimester    0.43-2.91   VITAMIN D 25 Hydroxy (Vit-D Deficiency, Fractures)     Status: None   Collection Time: 03/06/21 10:35 AM  Result Value Ref Range   Vit D, 25-Hydroxy 78 30 - 100 ng/mL    Comment: Vitamin D Status         25-OH Vitamin D: . Deficiency:                    <20 ng/mL Insufficiency:             20 - 29 ng/mL Optimal:                 > or = 30 ng/mL . For 25-OH Vitamin D testing on patients on  D2-supplementation and patients for whom quantitation  of D2 and D3 fractions is required, the [employer](TM) 25-OH VIT D, (D2,D3), LC/MS/MS is recommended: order  code [phone] (patients >74yrs). See Note 1 . Note 1 . For additional information, please refer to  http://education.Quest[facility].com/faq/FAQ199  (This link is being provided for informational/ educational purposes only.)   Vitamin B12     Status: None   Collection Time: 03/06/21 10:35 AM  Result Value Ref Range   Vitamin B-12 524 200 - 1,100 pg/mL    -------------------------------------------------------------------------- A&P:  Problem List Items Addressed This Visit   None Visit Diagnoses     Acute viral syndrome    -  Primary   Relevant Medications   predniSONE (DELTASONE) 10 MG tablet   ipratropium (ATROVENT) 0.06 % nasal spray      Acute viral syndrome Cannot rule out COVID19 No test done yet, symptom onset 4 days COVID vaccine and boosted, not received recent booster No sick contact Mild asthma, not using inhaler Rx Prednisone taper Start Atrovent nasal spray decongestant 2 sprays in each nostril up to 4 times daily for 7 days Supportive care Follow-up notify us if  covid test positive can consider paxlovid vs molnupiravir within next 24-48 hours if positive. Otherwise will treat symptoms now and follow up if worsening or new concern. If unresolved 7-10 days consider antibiotic course / CXR   Meds ordered this encounter  Medications   predniSONE (DELTASONE) 10 MG tablet    Sig: Take 6 tabs with breakfast Day 1, 5 tabs Day 2, 4 tabs Day 3, 3 tabs Day 4, 2 tabs Day 5, 1 tab Day 6.    Dispense:  21 tablet    Refill:  0   ipratropium (ATROVENT) 0.06 % nasal spray    Sig: Place 2 sprays into both nostrils 4 (four) times daily. For up to 5-7 days then stop.    Dispense:  15 mL    Refill:  0    Follow-up: - Return in 1 week as needed   Patient verbalizes understanding with the above medical recommendations including the limitation of remote medical advice.  Specific follow-up and call-back criteria were given for patient to follow-up or seek medical care more urgently if needed.   - Time spent in direct consultation with patient on phone: 9 minutes   Nobie Putnam, [facility] 05/15/2021, 1:36 PM

## 2021-05-15 NOTE — Patient Instructions (Addendum)
   Please schedule a Follow-up Appointment to: Return in about 1 week (around 05/22/2021), or if symptoms worsen or fail to improve.  If you have any other questions or concerns, please feel free to call the office or send a message through [place]. You may also schedule an earlier appointment if necessary.  Additionally, you may be receiving a survey about your experience at our office within a few days to 1 week by e-mail or mail. We value your feedback.  Nobie Putnam, DO [place]

## 2021-05-15 NOTE — Telephone Encounter (Signed)
The pt scheduled for a visit with Dr. Raliegh Ip this afternoon at 4pm.

## 2021-05-15 NOTE — Telephone Encounter (Signed)
Copied from [place] [id number]. Topic: Appointment Scheduling - Scheduling Inquiry for Clinic >> May 14, 2021  9:01 AM Loma [place] wrote: Reason for CRM: Pt has called in with a cough, congestion from starting Friday, now cough is produce thick Fleam, body aches, chills, wanting to be seen even if virtual pt wants a FU from [facility] nurse if possible, office line not available. Pt will expect a FU call at [phone]

## 2021-05-24 ENCOUNTER — Other Ambulatory Visit: Payer: Self-pay

## 2021-05-24 ENCOUNTER — Ambulatory Visit (INDEPENDENT_AMBULATORY_CARE_PROVIDER_SITE_OTHER): Admitting: Internal Medicine

## 2021-05-24 ENCOUNTER — Encounter: Payer: Self-pay | Admitting: Internal Medicine

## 2021-05-24 VITALS — BP 95/62 | HR 88 | Temp 97.1°F | Resp 17 | Ht 58.5 in | Wt 151.2 lb

## 2021-05-24 DIAGNOSIS — Z23 Encounter for immunization: Secondary | ICD-10-CM

## 2021-05-24 DIAGNOSIS — F32A Depression, unspecified: Secondary | ICD-10-CM

## 2021-05-24 DIAGNOSIS — E039 Hypothyroidism, unspecified: Secondary | ICD-10-CM | POA: Diagnosis not present

## 2021-05-24 DIAGNOSIS — Z114 Encounter for screening for human immunodeficiency virus [HIV]: Secondary | ICD-10-CM | POA: Diagnosis not present

## 2021-05-24 DIAGNOSIS — Z1159 Encounter for screening for other viral diseases: Secondary | ICD-10-CM

## 2021-05-24 DIAGNOSIS — Z0001 Encounter for general adult medical examination with abnormal findings: Secondary | ICD-10-CM

## 2021-05-24 DIAGNOSIS — F419 Anxiety disorder, unspecified: Secondary | ICD-10-CM

## 2021-05-24 MED ORDER — ALBUTEROL SULFATE HFA 108 (90 BASE) MCG/ACT IN AERS: 1.0000 | INHALATION_SPRAY | RESPIRATORY_TRACT | 2 refills | Status: DC | PRN

## 2021-05-24 MED ORDER — ESTRADIOL 0.1 MG/GM VA CREA: 1.0000 | TOPICAL_CREAM | VAGINAL | 2 refills | Status: AC

## 2021-05-24 NOTE — Assessment & Plan Note (Signed)
Active X genetic testing ordered

## 2021-05-24 NOTE — Progress Notes (Signed)
Subjective:    Patient ID: Grace Shepard, female    DOB: 1965/10/22, 55 y.o.   MRN: [id number]  HPI  Pt presents to the clinic today for her annual exam.  Flu: 06/2017 Tetanus: 06/2016 Covid: Moderna x 2 Shingrix: Never Pap Smear: 02/2018 Mammogram: 07/2020 Colon Screening: 09/2018 Vision Screening: every 2 years Dentist: biannually  Diet: She does eat meat. She consumes fruits and veggies. She does eat some fried foods. Shed drinks mostly water and Dr Malachi Bonds. 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   Hyperlipidemia    Ischemic colitis ([place])    Migraine    Thyroid disease    UTI (lower urinary tract infection)     Current Outpatient Medications  Medication Sig Dispense Refill   albuterol (PROVENTIL HFA;VENTOLIN HFA) 108 (90 Base) MCG/ACT inhaler Inhale into the lungs.     baclofen (LIORESAL) 10 MG tablet Take 10 mg by mouth as needed for muscle spasms.     buPROPion (WELLBUTRIN XL) 300 MG 24 hr tablet Take 1 tablet (300 mg total) by mouth daily. 90 tablet 0   cetirizine (ZYRTEC) 10 MG tablet Take 10 mg by mouth daily.     Cholecalciferol (VITAMIN D3) 125 MCG (5000 UT) CAPS Take 4,000 Units by mouth.     estradiol (ESTRACE) 0.1 MG/GM vaginal cream      ipratropium (ATROVENT) 0.06 % nasal spray Place 2 sprays into both nostrils 4 (four) times daily. For up to 5-7 days then stop. 15 mL 0   levothyroxine (SYNTHROID) 75 MCG tablet TAKE 1 TABLET DAILY 90 tablet 3   montelukast (SINGULAIR) 10 MG tablet Take 1 tablet (10 mg total) by mouth at bedtime. 90 tablet 3   pantoprazole (PROTONIX) 40 MG tablet Take 1 tablet (40 mg total) by mouth daily. 90 tablet 2   PARoxetine (PAXIL) 10 MG tablet TAKE 1 TABLET BY MOUTH EVERY DAY 90 tablet 0   predniSONE (DELTASONE) 10 MG tablet Take 6  tabs with breakfast Day 1, 5 tabs Day 2, 4 tabs Day 3, 3 tabs Day 4, 2 tabs Day 5, 1 tab Day 6. 21 tablet 0   pregabalin (LYRICA) 75 MG capsule Take 1 capsule (75 mg total) by mouth 2 (two) times daily. 180 capsule 0   rosuvastatin (CRESTOR) 10 MG tablet TAKE 1 TABLET DAILY 90 tablet 0   topiramate (TOPAMAX) 100 MG tablet Take 100 mg by mouth daily.     topiramate (TOPAMAX) 50 MG tablet 50 mg daily.     traZODone (DESYREL) 50 MG tablet TAKE 1-2 TABLETS BY MOUTH AT BEDTIME AS NEEDED FOR SLEEP. 180 tablet 0   triamcinolone cream (KENALOG) 0.1 % APPLY 1 APPLICATION TOPICALLY TWICE A DAY AS NEEDED. AVOID FACE, GROIN, AXILLA 30 g 0   vitamin B-12 (CYANOCOBALAMIN) 1000 MCG tablet Take 50 mcg by mouth every other day.      No current facility-administered medications for this visit.    Allergies  Allergen Reactions   Bactrim [Sulfamethoxazole-Trimethoprim] Hives   Gabapentin Other (See Comments)   Tramadol Itching    Family History  Problem Relation Age of Onset   Drug abuse Sister    Alcohol abuse Sister    Depression Sister    Rheum arthritis  Father    Congestive Heart Failure Father    Depression Father    Diabetes Father    Hearing loss Father    Osteoarthritis Mother    Stroke Mother    Aneurysm Mother    Breast cancer Maternal Aunt    Heart attack Maternal Grandfather    Diabetes Brother    Alcohol abuse Paternal Uncle    Arthritis Sister    Depression Sister    Heart disease Sister    Crohn's disease Sister     Social History   Socioeconomic History   Marital status: Married    Spouse name: Not on file   Number of children: Not on file   Years of education: Not on file   Highest education level: Not on file  Occupational History   Not on file  Tobacco Use   Smoking status: Never   Smokeless tobacco: Never  Vaping Use   Vaping Use: Never used  Substance and Sexual Activity   Alcohol use: No   Drug use: No   Sexual activity: Yes    Comment: Perimenopausal   Other Topics Concern   Not on file  Social History Narrative   Not on file   Social Determinants of Health   Financial Resource Strain: Not on file  Food Insecurity: Not on file  Transportation Needs: Not on file  Physical Activity: Not on file  Stress: Not on file  Social Connections: Not on file  Intimate Partner Violence: Not on file     Constitutional: Patient reports headaches.  Denies fever, malaise, fatigue, or abrupt weight changes.  HEENT: Denies eye pain, eye redness, ear pain, ringing in the ears, wax buildup, runny nose, nasal congestion, bloody nose, or sore throat. Respiratory: Denies difficulty breathing, shortness of breath, cough or sputum production.   Cardiovascular: Denies chest pain, chest tightness, palpitations or swelling in the hands or feet.  Gastrointestinal: Denies abdominal pain, bloating, constipation, diarrhea or blood in the stool.  GU: Denies urgency, frequency, pain with urination, burning sensation, blood in urine, odor or discharge. Musculoskeletal: Patient reports chronic joint and muscle pain.  Denies decrease in range of motion, difficulty with gait, or joint swelling.  Skin: Denies redness, rashes, lesions or ulcercations.  Neurological: Patient reports insomnia.  Denies dizziness, difficulty with memory, difficulty with speech or problems with balance and coordination.  Psych: Patient has a history of anxiety and depression.  Denies SI/HI.  No other specific complaints in a complete review of systems (except as listed in HPI above).  Objective:   Physical Exam  BP 95/62 (BP Location: Left Arm, Patient Position: Sitting, Cuff Size: Normal)   Pulse 88   Temp (!) 97.1 F (36.2 C) (Temporal)   Resp 17   Ht 4' 10.5" (1.486 m)   Wt 151 lb 3.2 oz (68.6 kg)   LMP 05/08/2015 Comment: neg preg  SpO2 99%   BMI 31.06 kg/m   Wt Readings from Last 3 Encounters:  05/15/21 150 lb (68 kg)  03/07/21 149 lb 9.6 oz (67.9 kg)  03/06/21 149 lb  (67.6 kg)    General: Appears her stated age, obese, in NAD. Skin: Warm, dry and intact.  HEENT: Head: normal shape and size; Eyes: EOMs intact;  Neck:  Neck supple, trachea midline. No masses, lumps or thyromegaly present.  Cardiovascular: Normal rate and rhythm. S1,S2 noted.  No murmur, rubs or gallops noted. No JVD or BLE edema. No carotid bruits noted. Pulmonary/Chest: Normal effort and positive vesicular breath  sounds. No respiratory distress. No wheezes, rales or ronchi noted.  Abdomen: Soft and nontender. Normal bowel sounds. No distention or masses noted. Liver, spleen and kidneys non palpable. Musculoskeletal: Strength 5/5 BUE/BLE. No difficulty with gait.  Neurological: Alert and oriented. Cranial nerves II-XII grossly intact. Coordination normal.  Psychiatric: Mood and affect flat. Behavior is normal. Judgment and thought content normal.    BMET    Component Value Date/Time   NA 143 04/11/2020 1205   NA 139 05/29/2014 0516   K 4.2 04/11/2020 1205   K 3.6 05/29/2014 0516   CL 110 04/11/2020 1205   CL 104 05/29/2014 0516   CO2 26 04/11/2020 1205   CO2 24 05/29/2014 0516   GLUCOSE 84 04/11/2020 1205   GLUCOSE 130 (H) 05/29/2014 0516   BUN 10 04/11/2020 1205   BUN 7 05/29/2014 0516   CREATININE 0.92 04/11/2020 1205   CREATININE 0.87 09/11/2018 1628   CALCIUM 9.5 04/11/2020 1205   CALCIUM 8.6 05/29/2014 0516   GFRNONAA 58 (L) 01/17/2020 1346   GFRNONAA >60 05/29/2014 0516   GFRNONAA >60 06/28/2013 0511   GFRAA >60 01/17/2020 1346   GFRAA >60 05/29/2014 0516   GFRAA >60 06/28/2013 0511    Lipid Panel     Component Value Date/Time   CHOL 122 04/11/2020 1205   TRIG 107.0 04/11/2020 1205   HDL 53.60 04/11/2020 1205   CHOLHDL 2 04/11/2020 1205   VLDL 21.4 04/11/2020 1205   LDLCALC 47 04/11/2020 1205   LDLCALC 109 (H) 09/11/2018 1628    CBC    Component Value Date/Time   WBC 6.8 03/06/2021 1035   RBC 4.69 03/06/2021 1035   HGB 13.8 03/06/2021 1035   HGB  13.9 05/29/2014 0516   HCT 42.4 03/06/2021 1035   HCT 41.3 05/29/2014 0516   PLT 225 03/06/2021 1035   PLT 227 05/29/2014 0516   MCV 90.4 03/06/2021 1035   MCV 89 05/29/2014 0516   MCH 29.4 03/06/2021 1035   MCHC 32.5 03/06/2021 1035   RDW 12.5 03/06/2021 1035   RDW 13.2 05/29/2014 0516   LYMPHSABS 1.9 12/30/2014 1425   LYMPHSABS 1.8 05/29/2014 0516   MONOABS 0.7 12/30/2014 1425   MONOABS 1.1 (H) 05/29/2014 0516   EOSABS 0.2 12/30/2014 1425   EOSABS 0.2 05/29/2014 0516   BASOSABS 0.1 12/30/2014 1425   BASOSABS 0.1 05/29/2014 0516    Hgb A1C Lab Results  Component Value Date   HGBA1C 5.6 04/11/2020           Assessment & Plan:   Preventative Health Maintenance:  Flu shot today Tetanus UTD Encouraged her to get her COVID-vaccine Discussed Shingrix vaccine, she will check coverage with her insurance company Pap smear UTD Mammogram due 07/2021 Colon screening UTD Encouraged her to consume a balanced diet and exercise regimen Advised her to see an eye doctor and dentist annually We will check CBC, c-Met, TSH, free T4, lipid, A1c, HIV and hep C today  RTC in 6 months, follow-up chronic conditions  [name], NP This visit occurred during the SARS-CoV-2 public health emergency.  Safety protocols were in place, including screening questions prior to the visit, additional usage of staff PPE, and extensive cleaning of exam room while observing appropriate contact time as indicated for disinfecting solutions.

## 2021-05-24 NOTE — Patient Instructions (Addendum)
Telemetry I just finished you on my own today health Maintenance, Female Adopting a healthy lifestyle and getting preventive care are important in promoting health and wellness. Ask your health care provider about: The right schedule for you to have regular tests and exams. Things you can do on your own to prevent diseases and keep yourself healthy. What should I know about diet, weight, and exercise? Eat a healthy diet  Eat a diet that includes plenty of vegetables, fruits, low-fat dairy products, and lean protein. Do not eat a lot of foods that are high in solid fats, added sugars, or sodium. Maintain a healthy weight Body mass index (BMI) is used to identify weight problems. It estimates body fat based on height and weight. Your health care provider can help determine your BMI and help you achieve or maintain a healthy weight. Get regular exercise Get regular exercise. This is one of the most important things you can do for your health. Most adults should: Exercise for at least 150 minutes each week. The exercise should increase your heart rate and make you sweat (moderate-intensity exercise). Do strengthening exercises at least twice a week. This is in addition to the moderate-intensity exercise. Spend less time sitting. Even light physical activity can be beneficial. Watch cholesterol and blood lipids Have your blood tested for lipids and cholesterol at 52 years of age, then have this test every 5 years. Have your cholesterol levels checked more often if: Your lipid or cholesterol levels are high. You are older than 54 years of age. You are at high risk for heart disease. What should I know about cancer screening? Depending on your health history and family history, you may need to have cancer screening at various ages. This may include screening for: Breast cancer. Cervical cancer. Colorectal cancer. Skin cancer. Lung cancer. What should I know about heart disease, diabetes, and  high blood pressure? Blood pressure and heart disease High blood pressure causes heart disease and increases the risk of stroke. This is more likely to develop in people who have high blood pressure readings, are of African descent, or are overweight. Have your blood pressure checked: Every 3-5 years if you are 71-86 years of age. Every year if you are 66 years old or older. Diabetes Have regular diabetes screenings. This checks your fasting blood sugar level. Have the screening done: Once every three years after age 38 if you are at a normal weight and have a low risk for diabetes. More often and at a younger age if you are overweight or have a high risk for diabetes. What should I know about preventing infection? Hepatitis B If you have a higher risk for hepatitis B, you should be screened for this virus. Talk with your health care provider to find out if you are at risk for hepatitis B infection. Hepatitis C Testing is recommended for: Everyone born from [birth year] through 1963. Anyone with known risk factors for hepatitis C. Sexually transmitted infections (STIs) Get screened for STIs, including gonorrhea and chlamydia, if: You are sexually active and are younger than 53 years of age. You are older than 53 years of age and your health care provider tells you that you are at risk for this type of infection. Your sexual activity has changed since you were last screened, and you are at increased risk for chlamydia or gonorrhea. Ask your health care provider if you are at risk. Ask your health care provider about whether you are at high risk for HIV.  Your health care provider may recommend a prescription medicine to help prevent HIV infection. If you choose to take medicine to prevent HIV, you should first get tested for HIV. You should then be tested every 3 months for as long as you are taking the medicine. Pregnancy If you are about to stop having your period (premenopausal) and you may become  pregnant, seek counseling before you get pregnant. Take 400 to 800 micrograms (mcg) of folic acid every day if you become pregnant. Ask for birth control (contraception) if you want to prevent pregnancy. Osteoporosis and menopause Osteoporosis is a disease in which the bones lose minerals and strength with aging. This can result in bone fractures. If you are 41 years old or older, or if you are at risk for osteoporosis and fractures, ask your health care provider if you should: Be screened for bone loss. Take a calcium or vitamin D supplement to lower your risk of fractures. Be given hormone replacement therapy (HRT) to treat symptoms of menopause. Follow these instructions at home: Lifestyle Do not use any products that contain nicotine or tobacco, such as cigarettes, e-cigarettes, and chewing tobacco. If you need help quitting, ask your health care provider. Do not use street drugs. Do not share needles. Ask your health care provider for help if you need support or information about quitting drugs. Alcohol use Do not drink alcohol if: Your health care provider tells you not to drink. You are pregnant, may be pregnant, or are planning to become pregnant. If you drink alcohol: Limit how much you use to 0-1 drink a day. Limit intake if you are breastfeeding. Be aware of how much alcohol is in your drink. In the [place] one drink equals one 12 oz bottle of beer (355 mL), one 5 oz glass of wine (148 mL), or one 1 oz glass of hard liquor (44 mL). General instructions Schedule regular health, dental, and eye exams. Stay current with your vaccines. Tell your health care provider if: You often feel depressed. You have ever been abused or do not feel safe at home. Summary Adopting a healthy lifestyle and getting preventive care are important in promoting health and wellness. Follow your health care provider's instructions about healthy diet, exercising, and getting tested or screened for  diseases. Follow your health care provider's instructions on monitoring your cholesterol and blood pressure. This information is not intended to replace advice given to you by your health care provider. Make sure you discuss any questions you have with your health care provider. Document Revised: 10/06/2020 Document Reviewed: 07/22/2018 Elsevier Patient Education  2022 [employer].

## 2021-05-25 ENCOUNTER — Other Ambulatory Visit: Payer: Self-pay

## 2021-05-25 LAB — COMPLETE METABOLIC PANEL WITH GFR
AG Ratio: 2.2 (calc) (ref 1.0–2.5)
ALT: 25 U/L (ref 6–29)
AST: 17 U/L (ref 10–35)
Albumin: 4 g/dL (ref 3.6–5.1)
Alkaline phosphatase (APISO): 120 U/L (ref 37–153)
BUN: 13 mg/dL (ref 7–25)
CO2: 26 mmol/L (ref 20–32)
Calcium: 9.1 mg/dL (ref 8.6–10.4)
Chloride: 110 mmol/L (ref 98–110)
Creat: 0.88 mg/dL (ref 0.50–1.03)
Globulin: 1.8 g/dL (calc) — ABNORMAL LOW (ref 1.9–3.7)
Glucose, Bld: 73 mg/dL (ref 65–139)
Potassium: 3.6 mmol/L (ref 3.5–5.3)
Sodium: 142 mmol/L (ref 135–146)
Total Bilirubin: 0.3 mg/dL (ref 0.2–1.2)
Total Protein: 5.8 g/dL — ABNORMAL LOW (ref 6.1–8.1)
eGFR: 79 mL/min/{1.73_m2} (ref >=60)

## 2021-05-25 LAB — CBC
HCT: 40.2 % (ref 35.0–45.0)
Hemoglobin: 13 g/dL (ref 11.7–15.5)
MCH: 28.9 pg (ref 27.0–33.0)
MCHC: 32.3 g/dL (ref 32.0–36.0)
MCV: 89.3 fL (ref 80.0–100.0)
MPV: 10.1 fL (ref 7.5–12.5)
Platelets: 260 10*3/uL (ref 140–400)
RBC: 4.5 10*6/uL (ref 3.80–5.10)
RDW: 12.5 % (ref 11.0–15.0)
WBC: 10.4 10*3/uL (ref 3.8–10.8)

## 2021-05-25 LAB — HEPATITIS C ANTIBODY
Hepatitis C Ab: NONREACTIVE
SIGNAL TO CUT-OFF: 0 (ref <1.00)

## 2021-05-25 LAB — LIPID PANEL
Cholesterol: 124 mg/dL (ref <200)
HDL: 47 mg/dL — ABNORMAL LOW (ref >=50)
LDL Cholesterol (Calc): 49 mg/dL (calc)
Non-HDL Cholesterol (Calc): 77 mg/dL (calc) (ref <130)
Total CHOL/HDL Ratio: 2.6 (calc) (ref <5.0)
Triglycerides: 212 mg/dL — ABNORMAL HIGH (ref <150)

## 2021-05-25 LAB — HEMOGLOBIN A1C
Hgb A1c MFr Bld: 5.1 % of total Hgb (ref <5.7)
Mean Plasma Glucose: 100 mg/dL
eAG (mmol/L): 5.5 mmol/L

## 2021-05-25 LAB — T4, FREE: Free T4: 1.1 ng/dL (ref 0.8–1.8)

## 2021-05-25 LAB — HIV ANTIBODY (ROUTINE TESTING W REFLEX): HIV 1&2 Ab, 4th Generation: NONREACTIVE

## 2021-05-25 LAB — TSH: TSH: 1.2 mIU/L

## 2021-05-27 MED ORDER — PAROXETINE HCL 10 MG PO TABS: 10.0000 mg | ORAL_TABLET | Freq: Every day | ORAL | 0 refills | Status: DC

## 2021-05-27 MED ORDER — TRAZODONE HCL 50 MG PO TABS: 50.0000 mg | ORAL_TABLET | Freq: Every evening | ORAL | 0 refills | Status: DC | PRN

## 2021-06-07 ENCOUNTER — Other Ambulatory Visit: Payer: Self-pay | Admitting: Family Medicine

## 2021-06-07 DIAGNOSIS — B349 Viral infection, unspecified: Secondary | ICD-10-CM

## 2021-06-08 NOTE — Telephone Encounter (Signed)
No protocol to assess. Was ordered short term.  Requested Prescriptions  Pending Prescriptions Disp Refills  . ipratropium (ATROVENT) 0.06 % nasal spray [Pharmacy Med Name: IPRATROPIUM 0.06% SPRAY]      Sig: PLACE 2 SPRAYS INTO BOTH NOSTRILS 4 (FOUR) TIMES DAILY. FOR UP TO 5-7 DAYS THEN STOP.     Off-Protocol Failed - 06/07/2021 12:34 PM      Failed - Medication not assigned to a protocol, review manually.      Passed - Valid encounter within last 12 months    Recent Outpatient Visits          2 weeks ago Encounter for general adult medical examination with abnormal findings   [facility] [place], Coralie Keens, NP   3 weeks ago Acute viral syndrome   [place], DO   3 months ago Excessive daytime sleepiness   [facility] [place], Coralie Keens, NP   3 months ago Psychophysiological insomnia   [facility] [place], [place] W, NP   5 months ago Postmenopausal atrophic vaginitis   [facility], NP            Off-Protocol Failed - 06/07/2021 12:34 PM      Failed - Medication not assigned to a protocol, review manually.      Passed - Valid encounter within last 12 months    Recent Outpatient Visits          2 weeks ago Encounter for general adult medical examination with abnormal findings   [facility] [place], Coralie Keens, NP   3 weeks ago Acute viral syndrome   [place], DO   3 months ago Excessive daytime sleepiness   [facility] [place], Coralie Keens, NP   3 months ago Psychophysiological insomnia   [facility] [place], [place] W, NP   5 months ago Postmenopausal atrophic vaginitis   [facility] [place], Coralie Keens, [place]

## 2021-06-11 ENCOUNTER — Other Ambulatory Visit
Admission: RE | Admit: 2021-06-11 | Discharge: 2021-06-11 | Disposition: A | Discharge status: Home / Self Care | Attending: Internal Medicine | Admitting: Internal Medicine

## 2021-06-11 ENCOUNTER — Inpatient Hospital Stay: Admit: 2021-06-11

## 2021-06-11 ENCOUNTER — Other Ambulatory Visit: Payer: Self-pay | Admitting: Internal Medicine

## 2021-06-11 DIAGNOSIS — F32A Depression, unspecified: Secondary | ICD-10-CM | POA: Insufficient documentation

## 2021-06-11 DIAGNOSIS — L308 Other specified dermatitis: Secondary | ICD-10-CM

## 2021-06-11 DIAGNOSIS — E7800 Pure hypercholesterolemia, unspecified: Secondary | ICD-10-CM

## 2021-06-11 DIAGNOSIS — F419 Anxiety disorder, unspecified: Secondary | ICD-10-CM | POA: Insufficient documentation

## 2021-06-11 NOTE — Telephone Encounter (Signed)
Requested Prescriptions  Pending Prescriptions Disp Refills  . montelukast (SINGULAIR) 10 MG tablet [Pharmacy Med Name: MONTELUKAST SODIUM TABS 10MG ] 90 tablet 3    Sig: TAKE 1 TABLET AT BEDTIME     Pulmonology:  Leukotriene Inhibitors Passed - 06/11/2021 11:49 AM      Passed - Valid encounter within last 12 months    Recent Outpatient Visits          2 weeks ago Encounter for general adult medical examination with abnormal findings   [facility] [place], Coralie Keens, NP   3 weeks ago Acute viral syndrome   [facility] [place], Devonne Doughty, DO   3 months ago Excessive daytime sleepiness   [facility] [place], [place], NP   3 months ago Psychophysiological insomnia   [facility] [place], [place], NP   6 months ago Postmenopausal atrophic vaginitis   [facility] [place], [place], NP             . rosuvastatin ([place]) 10 MG tablet [Pharmacy Med Name: ROSUVASTATIN TABS 10MG ] 90 tablet 3    Sig: TAKE 1 TABLET DAILY     Cardiovascular:  Antilipid - Statins Failed - 06/11/2021 11:49 AM      Failed - HDL in normal range and within 360 days    HDL  Date Value Ref Range Status  05/24/2021 47 (L) > OR = 50 mg/dL Final         Failed - Triglycerides in normal range and within 360 days    Triglycerides  Date Value Ref Range Status  05/24/2021 212 (H) <150 mg/dL Final    Comment:    . If a non-fasting specimen was collected, consider repeat triglyceride testing on a fasting specimen if clinically indicated.  Yates Decamp et al. J. of Clin. Lipidol. 3235;5:732-202. Marland Kitchen          Passed - Total Cholesterol in normal range and within 360 days    Cholesterol  Date Value Ref Range Status  05/24/2021 124 <200 mg/dL Final         Passed - LDL in normal range and within 360 days    LDL Cholesterol (Calc)  Date Value Ref Range Status  05/24/2021 49 mg/dL (calc) Final    Comment:    Reference range:  <100 . Desirable range <100 mg/dL for primary prevention;   <70 mg/dL for patients with CHD or diabetic patients  with > or = 2 CHD risk factors. Marland Kitchen LDL-C is now calculated using the Martin-Hopkins  calculation, which is a validated novel method providing  better accuracy than the Friedewald equation in the  estimation of LDL-C.  Cresenciano Genre et al. Annamaria Helling. 5427;062(37): 2061-2068  (http://education.[employer].com/faq/FAQ164)    Direct LDL  Date Value Ref Range Status  02/18/2018 160.0 mg/dL Final    Comment:    Optimal:  <100 mg/dLNear or Above Optimal:  100-129 mg/dLBorderline High:  130-159 mg/dLHigh:  160-189 mg/dLVery High:  >190 mg/dL         Passed - Patient is not pregnant      Passed - Valid encounter within last 12 months    Recent Outpatient Visits          2 weeks ago Encounter for general adult medical examination with abnormal findings   [place], NP   3 weeks ago Acute viral syndrome   [place], Devonne Doughty, DO  3 months ago Excessive daytime sleepiness   [facility] [place], Coralie Keens, NP   3 months ago Psychophysiological insomnia   [facility] [place], [place], NP   6 months ago Postmenopausal atrophic vaginitis   [facility] [place], Coralie Keens, [place]

## 2021-06-11 NOTE — Telephone Encounter (Signed)
Requested medications are due for refill today.  Estradiol - no. Triamcinolone - yes  Requested medications are on the active medications list.  yes  Last refill. Estradiol - 05/25/2021. Triamcinolone - 12/12/2020  Future visit scheduled.   no  Notes to clinic.  Estradiol was requested too soon. Triamcinolone Rx was written by a historical provider.

## 2021-06-11 NOTE — Telephone Encounter (Signed)
Requested Prescriptions  Pending Prescriptions Disp Refills  . buPROPion (WELLBUTRIN XL) 300 MG 24 hr tablet [Pharmacy Med Name: BUPROPION HCL XL TABS 300MG ] 90 tablet 0    Sig: TAKE 1 TABLET DAILY     Psychiatry: Antidepressants - bupropion Passed - 06/11/2021  8:12 PM      Passed - Completed PHQ-2 or PHQ-9 in the last 360 days      Passed - Last BP in normal range    BP Readings from Last 1 Encounters:  05/24/21 95/62         Passed - Valid encounter within last 6 months    Recent Outpatient Visits          2 weeks ago Encounter for general adult medical examination with abnormal findings   [facility] [place], Coralie Keens, NP   3 weeks ago Acute viral syndrome   [place], DO   3 months ago Excessive daytime sleepiness   [facility] [place], Coralie Keens, NP   3 months ago Psychophysiological insomnia   [facility] [place], [place], NP   6 months ago Postmenopausal atrophic vaginitis   [facility] [place], [place], NP             . estradiol (ESTRACE) 0.1 MG/GM vaginal cream [Pharmacy Med Name: ESTRADIOL VAG CRM W/APPL 42.5GM 0.01%] 42.5 g 13    Sig: APPLY 1 APPLICATORFUL VAGINALLY THREE TIMES A WEEK     OB/GYN:  Estrogens Passed - 06/11/2021  8:12 PM      Passed - Mammogram is up-to-date per Health Maintenance      Passed - Last BP in normal range    BP Readings from Last 1 Encounters:  05/24/21 95/62         Passed - Valid encounter within last 12 months    Recent Outpatient Visits          2 weeks ago Encounter for general adult medical examination with abnormal findings   [facility] [place], Coralie Keens, NP   3 weeks ago Acute viral syndrome   [place], DO   3 months ago Excessive daytime sleepiness   [facility] [place], Coralie Keens, NP   3 months ago Psychophysiological insomnia   [facility] [place], [place], NP   6 months ago Postmenopausal atrophic vaginitis   [facility] [place], [place], NP             . triamcinolone cream (KENALOG) 0.1 % [Pharmacy Med Name: TRIAMCINOLONE CREAM 15GM 0.1%] 60 g 11    Sig: APPLY 1 APPLICATION TOPICALLY TWICE A DAY AS NEEDED. [place], [place]     Dermatology:  Corticosteroids Passed - 06/11/2021  8:12 PM      Passed - Valid encounter within last 12 months    Recent Outpatient Visits          2 weeks ago Encounter for general adult medical examination with abnormal findings   [facility] [place], Coralie Keens, NP   3 weeks ago Acute viral syndrome   [place], DO   3 months ago Excessive daytime sleepiness   [facility] [place], Coralie Keens, NP   3 months ago Psychophysiological insomnia   [facility] [place], [place], NP   6 months ago Postmenopausal atrophic  vaginitis   [facility] [place], Coralie Keens, [place]

## 2021-06-13 ENCOUNTER — Other Ambulatory Visit: Payer: Self-pay

## 2021-06-19 ENCOUNTER — Other Ambulatory Visit: Payer: Self-pay

## 2021-06-20 ENCOUNTER — Other Ambulatory Visit: Payer: Self-pay | Admitting: Oncology

## 2021-06-20 MED ORDER — PAROXETINE HCL 10 MG PO TABS: 10.0000 mg | ORAL_TABLET | Freq: Every day | ORAL | 1 refills | Status: DC

## 2021-06-27 NOTE — Progress Notes (Signed)
PROVIDER NOTE: Information contained herein reflects review and annotations entered in association with encounter. Interpretation of such information and data should be left to medically-trained personnel. Information provided to patient can be located elsewhere in the medical record under "Patient Instructions". Document created using STT-dictation technology, any transcriptional errors that may result from process are unintentional.    Patient: Grace Shepard  Service Category: Procedure Provider: Gaspar Cola, MD DOB: Dec 14, 1965 DOS: 06/28/2021 Location: [facility] MRN: [id number] Setting: Ambulatory - outpatient Referring Provider: [name], NP Type: Established Patient Specialty: Interventional Pain Management PCP: [name], NP  Primary Reason for Visit: Interventional Pain Management Treatment. CC: Back Pain (Right, lower)    Procedure #1:  Anesthesia, Analgesia, Anxiolysis:  Type: Diagnostic Sacroiliac Joint Steroid Injection #3  Region: Inferior Lumbosacral Region Level: PSIS (Posterior Superior Iliac Spine) Laterality: Right  Anesthesia: Local (1-2% Lidocaine)  Anxiolysis: None  Sedation: None  Guidance: Fluoroscopy           Position: Prone           Indications: 1. Chronic sacroiliac joint pain (Right)   2. 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 Trochanteric bursitis, right hip   2. Trochanteric bursitis of hip (Bilateral)   3. Greater trochanteric bursitis of hips (Bilateral)   4. Enthesopathy of hip region (Left)    Pain Score: Pre-procedure: 2 /10 Post-procedure: 0-No pain/10     Pre-op H&P Assessment:  Grace Shepard is a 55 y.o. (year old), female patient,  seen today for interventional treatment. She  has a past surgical history that includes Tubal ligation. Grace Shepard has a current medication list which includes the following prescription(s): albuterol, baclofen, bupropion, cetirizine, vitamin d3, estradiol, ipratropium, levothyroxine, montelukast, pantoprazole, paroxetine, pregabalin, rosuvastatin, topiramate, trazodone, triamcinolone cream, and vitamin b-12, and the following Facility-Administered Medications: pentafluoroprop-tetrafluoroeth. Her primarily concern today is the Back Pain (Right, lower)  Initial Vital Signs:  Pulse/HCG Rate: 88  Temp: (!) 96.5 F (35.8 C) Resp: 16 BP: 131/85 SpO2: 100 %  BMI: Estimated body mass index is 31.35 kg/m as calculated from the following:   Height as of this encounter: 4\' 10"  (1.473 m).   Weight as of this encounter: 150 lb (68 kg).  Risk Assessment: Allergies: Reviewed. She is allergic to bactrim [sulfamethoxazole-trimethoprim], gabapentin, and tramadol.  Allergy Precautions: None required Coagulopathies: Reviewed. None identified.  Blood-thinner therapy: None at this time Active Infection(s): Reviewed. None identified. Grace Shepard is afebrile  Site Confirmation: Grace Shepard was asked to confirm the procedure and laterality before marking the site Procedure checklist: Completed Consent: Before the procedure and under the influence of no sedative(s), amnesic(s), or anxiolytics, the patient was informed of the treatment options, risks and possible complications. To fulfill our ethical and legal obligations, as recommended by the American Medical Association's Code of Ethics, I have informed the patient of my clinical impression; the nature and purpose of the treatment or procedure; the risks, benefits, and possible complications of the intervention; the alternatives, including doing nothing; the risk(s) and benefit(s) of the alternative treatment(s) or procedure(s); and the risk(s) and benefit(s) of  doing nothing. The patient was provided information about the general risks and possible complications associated with the procedure. These may  include, but are not limited to: failure to achieve desired goals, infection, bleeding, organ or nerve damage, allergic reactions, paralysis, and death. In addition, the patient was informed of those risks and complications associated to the procedure, such as failure to decrease pain; infection; bleeding; organ or nerve damage with subsequent damage to sensory, motor, and/or autonomic systems, resulting in permanent pain, numbness, and/or weakness of one or several areas of the body; allergic reactions; (i.e.: anaphylactic reaction); and/or death. Furthermore, the patient was informed of those risks and complications associated with the medications. These include, but are not limited to: allergic reactions (i.e.: anaphylactic or anaphylactoid reaction(s)); adrenal axis suppression; blood sugar elevation that in diabetics may result in ketoacidosis or comma; water retention that in patients with history of congestive heart failure may result in shortness of breath, pulmonary edema, and decompensation with resultant heart failure; weight gain; swelling or edema; medication-induced neural toxicity; particulate matter embolism and blood vessel occlusion with resultant organ, and/or nervous system infarction; and/or aseptic necrosis of one or more joints. Finally, the patient was informed that Medicine is not an exact science; therefore, there is also the possibility of unforeseen or unpredictable risks and/or possible complications that may result in a catastrophic outcome. The patient indicated having understood very clearly. We have given the patient no guarantees and we have made no promises. Enough time was given to the patient to ask questions, all of which were answered to the patient's satisfaction. Grace Shepard has indicated that she wanted to continue with the  procedure. Attestation: I, the ordering provider, attest that I have discussed with the patient the benefits, risks, side-effects, alternatives, likelihood of achieving goals, and potential problems during recovery for the procedure that I have provided informed consent. Date  Time: 06/28/2021  9:40 AM  Pre-Procedure Preparation:  Monitoring: As per clinic protocol. Respiration, ETCO2, SpO2, BP, heart rate and rhythm monitor placed and checked for adequate function Safety Precautions: Patient was assessed for positional comfort and pressure points before starting the procedure. Time-out: I initiated and conducted the "Time-out" before starting the procedure, as per protocol. The patient was asked to participate by confirming the accuracy of the "Time Out" information. Verification of the correct person, site, and procedure were performed and confirmed by me, the nursing staff, and the patient. "Time-out" conducted as per Joint Commission's Universal Protocol (UP.01.01.01). Time: 1022  Description of Procedure #1:  Target Area: Superior, posterior, aspect of the sacroiliac fissure Approach: Posterior, paraspinal, ipsilateral approach. Area Prepped: Entire Lower Lumbosacral Region DuraPrep (Iodine Povacrylex [0.7% available iodine] and Isopropyl Alcohol, 74% w/w) Safety Precautions: Aspiration looking for blood return was conducted prior to all injections. At no point did we inject any substances, as a needle was being advanced. No attempts were made at seeking any paresthesias. Safe injection practices and needle disposal techniques used. Medications properly checked for expiration dates. SDV (single dose vial) medications used. Description of the Procedure: Protocol guidelines were followed. The patient was placed in position over the procedure table. The target area was identified and the area prepped in the usual manner. Skin & deeper tissues infiltrated with local anesthetic. Appropriate amount  of time allowed to pass for local anesthetics to take effect. The procedure needle was advanced under fluoroscopic guidance into the sacroiliac joint until a firm endpoint was obtained. Proper needle placement secured. Negative aspiration confirmed. Solution injected in intermittent fashion, asking for systemic symptoms every 0.5cc of injectate. The needles were then removed and the area cleansed, making sure to  leave some of the prepping solution back to take advantage of its long term bactericidal properties.  Vitals:   06/28/21 1025 06/28/21 1030 06/28/21 1035 06/28/21 1040  BP: 110/68 111/67 96/78 113/73  Pulse: 86 81 80 84  Resp: 15 16 16 16   Temp:    98.2 F (36.8 C)  TempSrc:      SpO2: 100% 98% 99% 98%  Weight:      Height:        Start Time: 1022 hrs. End Time: 1031 hrs. Materials:  Needle(s) Type: Spinal Needle Gauge: 22G Length: 5.0-in Medication(s): Please see orders for medications and dosing details.  Description of Procedure #2:  Area Prepped: Entire Posterolateral hip area. DuraPrep (Iodine Povacrylex [0.7% available iodine] and Isopropyl Alcohol, 74% w/w) Safety Precautions: Aspiration looking for blood return was conducted prior to all injections. At no point did we inject any substances, as a needle was being advanced. No attempts were made at seeking any paresthesias. Safe injection practices and needle disposal techniques used. Medications properly checked for expiration dates. SDV (single dose vial) medications used. Description of the Procedure: Protocol guidelines were followed. The patient was placed in position over the procedure table. The target area was identified and the area prepped in the usual manner. Skin & deeper tissues infiltrated with local anesthetic. Appropriate amount of time allowed to pass for local anesthetics to take effect. The procedure needles were then advanced to the target area. Proper needle placement secured. Negative aspiration  confirmed. Solution injected in intermittent fashion, asking for systemic symptoms every 0.5cc of injectate. The needles were then removed and the area cleansed, making sure to leave some of the prepping solution back to take advantage of its long term bactericidal properties.  Vitals:   06/28/21 1025 06/28/21 1030 06/28/21 1035 06/28/21 1040  BP: 110/68 111/67 96/78 113/73  Pulse: 86 81 80 84  Resp: 15 16 16 16   Temp:    98.2 F (36.8 C)  TempSrc:      SpO2: 100% 98% 99% 98%  Weight:      Height:        Start Time: 1022 hrs. End Time: 1031 hrs.           Materials:  Needle(s) Type: Spinal Needle Gauge: 22G Length: 5.0-in Medication(s): Please see orders for medications and dosing details.  Imaging Guidance (Non-Spinal) for procedure #1:  Type of Imaging Technique: Fluoroscopy Guidance (Non-Spinal) Indication(s): Assistance in needle guidance and placement for procedures requiring needle placement in or near specific anatomical locations not easily accessible without such assistance. Exposure Time: Please see nurses notes. Contrast: Before injecting any contrast, we confirmed that the patient did not have an allergy to iodine, shellfish, or radiological contrast. Once satisfactory needle placement was completed at the desired level, radiological contrast was injected. Contrast injected under live fluoroscopy. No contrast complications. See chart for type and volume of contrast used. Fluoroscopic Guidance: I was personally present during the use of fluoroscopy. "Tunnel Vision Technique" used to obtain the best possible view of the target area. Parallax error corrected before commencing the procedure. "Direction-depth-direction" technique used to introduce the needle under continuous pulsed fluoroscopy. Once target was reached, antero-posterior, oblique, and lateral fluoroscopic projection used confirm needle placement in all planes. Images permanently stored in EMR. Interpretation: I  personally interpreted the imaging intraoperatively. Adequate needle placement confirmed in multiple planes. Appropriate spread of contrast into desired area was observed. No evidence of afferent or efferent intravascular uptake. Permanent images saved into the  patient's record.  Imaging Guidance (Non-Spinal) for procedure #2:  Type of Imaging Technique: Fluoroscopy Guidance (Non-Spinal) Indication(s): Assistance in needle guidance and placement for procedures requiring needle placement in or near specific anatomical locations not easily accessible without such assistance. Exposure Time: Please see nurses notes. Contrast: None used. Fluoroscopic Guidance: I was personally present during the use of fluoroscopy. "Tunnel Vision Technique" used to obtain the best possible view of the target area. Parallax error corrected before commencing the procedure. "Direction-depth-direction" technique used to introduce the needle under continuous pulsed fluoroscopy. Once target was reached, antero-posterior, oblique, and lateral fluoroscopic projection used confirm needle placement in all planes. Images permanently stored in EMR. Interpretation: No contrast injected.  Antibiotic Prophylaxis:   Anti-infectives (From admission, onward)    None      Indication(s): None identified  Post-operative Assessment:  Post-procedure Vital Signs:  Pulse/HCG Rate: 84  Temp: 98.2 F (36.8 C) Resp: 16 BP: 113/73 SpO2: 98 %  EBL: None  Complications: No immediate post-treatment complications observed by team, or reported by patient.  Note: The patient tolerated the entire procedure well. A repeat set of vitals were taken after the procedure and the patient was kept under observation following institutional policy, for this type of procedure. Post-procedural neurological assessment was performed, showing return to baseline, prior to discharge. The patient was provided with post-procedure discharge instructions,  including a section on how to identify potential problems. Should any problems arise concerning this procedure, the patient was given instructions to immediately contact us, at any time, without hesitation. In any case, we plan to contact the patient by telephone for a follow-up status report regarding this interventional procedure.  Comments:  No additional relevant information.  Plan of Care  Orders:  Orders Placed This Encounter  Procedures   SACROILIAC JOINT INJECTION    Scheduling Instructions:     Side: Right-sided     Sedation: Patient's choice.     Timeframe: Today    Order Specific Question:   Where will this procedure be performed?    Answer:   [facility]   HIP INJECTION    Purpose: Therapeutic/Diagnostic Indication: Hip pain 2ry to Trochanteric Burlitis right (M70.61).    Scheduling Instructions:     Procedure: Trochanteric bursa injection     Laterality: Right-sided     Sedation: Patient's choice.     Timeframe: Today   DG [facility] C-ARM 1-60 MIN NO REPORT    Intraoperative interpretation by procedural physician at [place].    Standing Status:   Standing    Number of Occurrences:   1    Order Specific Question:   Reason for exam:    Answer:   Assistance in needle guidance and placement for procedures requiring needle placement in or near specific anatomical locations not easily accessible without such assistance.   Informed Consent Details: Physician/Practitioner Attestation; Transcribe to consent form and obtain patient signature    Nursing Order: Transcribe to consent form and obtain patient signature. Note: Always confirm laterality of pain with Ms. Earleen Newport, before procedure.    Order Specific Question:   Physician/Practitioner attestation of informed consent for procedure/surgical case    Answer:   I, the physician/practitioner, attest that I have discussed with the patient the benefits, risks, side effects, alternatives, likelihood of  achieving goals and potential problems during recovery for the procedure that I have provided informed consent.    Order Specific Question:   Procedure    Answer:   Sacroiliac Joint  Block    Order Specific Question:   Physician/Practitioner performing the procedure    Answer:   Jaasia Viglione A. Dossie Arbour, MD    Order Specific Question:   Indication/Reason    Answer:   Chronic Low Back and Hip Pain secondary to Sacroiliac Joint Pain (Arthralgia/Arthropathy)   Informed Consent Details: Physician/Practitioner Attestation; Transcribe to consent form and obtain patient signature    Note: Always confirm laterality of pain with Ms. Earleen Newport, before procedure. Transcribe to consent form and obtain patient signature.    Order Specific Question:   Physician/Practitioner attestation of informed consent for procedure/surgical case    Answer:   I, the physician/practitioner, attest that I have discussed with the patient the benefits, risks, side effects, alternatives, likelihood of achieving goals and potential problems during recovery for the procedure that I have provided informed consent.    Order Specific Question:   Procedure    Answer:   Hip bursa injection    Order Specific Question:   Physician/Practitioner performing the procedure    Answer:   Ajene Carchi A. Dossie Arbour, MD    Order Specific Question:   Indication/Reason    Answer:   Hip bursitis   Provide equipment / supplies at bedside    "Block Tray" (Disposable  single use) Needle type: SpinalSpinal Amount/quantity: 2 Size: Medium (5-inch) Gauge: 22G    Standing Status:   Standing    Number of Occurrences:   1    Order Specific Question:   Specify    Answer:   Block Tray    Chronic Opioid Analgesic:  None. No opioid analgesics prescribed by our practice. MME/day: 0 mg/day   Medications ordered for procedure: Meds ordered this encounter  Medications   lidocaine (XYLOCAINE) 2 % (with pres) injection 400 mg   pentafluoroprop-tetrafluoroeth  (GEBAUERS) aerosol   lactated ringers infusion 1,000 mL   midazolam (VERSED) 5 MG/5ML injection 0.5-2 mg    Make sure Flumazenil is available in the pyxis when using this medication. If oversedation occurs, administer 0.2 mg IV over 15 sec. If after 45 sec no response, administer 0.2 mg again over 1 min; may repeat at 1 min intervals; not to exceed 4 doses (1 mg)   methylPREDNISolone acetate (DEPO-MEDROL) injection 80 mg   ropivacaine (PF) 2 mg/mL (0.2%) (NAROPIN) injection 4 mL   ropivacaine (PF) 2 mg/mL (0.2%) (NAROPIN) injection 4 mL   methylPREDNISolone acetate (DEPO-MEDROL) injection 80 mg    Medications administered: We administered lidocaine, lactated ringers, midazolam, methylPREDNISolone acetate, ropivacaine (PF) 2 mg/mL (0.2%), ropivacaine (PF) 2 mg/mL (0.2%), and methylPREDNISolone acetate.  See the medical record for exact dosing, route, and time of administration.  Follow-up plan:   Return in about 2 weeks (around 07/12/2021) for Proc-day (T,Th), (VV), (PPE).      Interventional Therapies  Risk  Complexity Considerations:   Estimated body mass index is 31.35 kg/m as calculated from the following:   Height as of this encounter: 4\' 10"  (1.473 m).   Weight as of this encounter: 150 lb (68 kg). WNL   Planned  Pending:   Pending further evaluation   Under consideration:   Diagnostic bilateral sacroiliac joint block  Diagnostic bilateral trochanteric bursa injections  Diagnostic bilateral IA hip joint injections  Diagnostic right-sided cervical ESI  Diagnostic right-sided occipital nerve blocks  Possible right occipital nerve RFA  Diagnostic right C2/TON nerve block  Possible right C2/TON RFA    Completed:   Therapeutic right SI joint block x3 (06/28/2021) (first: 100/100/90/90-100) (lower approach-more effective)  Therapeutic left IA hip injection x2 (11/21/2020)  Therapeutic right trochanteric bursa injection x2 (06/28/2021)  Therapeutic left trochanteric bursa  injection x2 (11/21/2020)    Therapeutic  Palliative (PRN) options:   Palliative right SI joint block #4 (lower approach)  Palliative right trochanteric bursa injection #3  Palliative left trochanteric bursa injection #3  Palliative left IA hip injection #3     Recent Visits Date Type Provider Dept  04/30/21 Office Visit Milinda Pointer, MD [facility]  Showing recent visits within past 90 days and meeting all other requirements Today's Visits Date Type Provider Dept  06/28/21 Procedure visit Milinda Pointer, MD [facility]  Showing today's visits and meeting all other requirements Future Appointments Date Type Provider Dept  07/12/21 Appointment Milinda Pointer, MD [facility]  Showing future appointments within next 90 days and meeting all other requirements Disposition: Discharge home  Discharge (Date  Time): 06/28/2021; 1045 hrs.   Primary Care Physician: [name], NP Location: [facility] Note by: Gaspar Cola, MD Date: 06/28/2021; Time: 11:18 AM  Disclaimer:  Medicine is not an Chief Strategy Officer. The only guarantee in medicine is that nothing is guaranteed. It is important to note that the decision to proceed with this intervention was based on the information collected from the patient. The Data and conclusions were drawn from the patient's questionnaire, the interview, and the physical examination. Because the information was provided in large part by the patient, it cannot be guaranteed that it has not been purposely or unconsciously manipulated. Every effort has been made to obtain as much relevant data as possible for this evaluation. It is important to note that the conclusions that lead to this procedure are derived in large part from the available data. Always take into account that the treatment will also be dependent on availability of resources and existing treatment guidelines, considered by  other Pain Management Practitioners as being common knowledge and practice, at the time of the intervention. For Medico-Legal purposes, it is also important to point out that variation in procedural techniques and pharmacological choices are the acceptable norm. The indications, contraindications, technique, and results of the above procedure should only be interpreted and judged by a Board-Certified Interventional Pain Specialist with extensive familiarity and expertise in the same exact procedure and technique.

## 2021-06-28 ENCOUNTER — Ambulatory Visit
Admission: RE | Admit: 2021-06-28 | Discharge: 2021-06-28 | Disposition: A | Discharge status: Home / Self Care | Source: Ambulatory Visit | Attending: Pain Medicine | Admitting: Pain Medicine

## 2021-06-28 ENCOUNTER — Other Ambulatory Visit: Payer: Self-pay

## 2021-06-28 ENCOUNTER — Ambulatory Visit (HOSPITAL_BASED_OUTPATIENT_CLINIC_OR_DEPARTMENT_OTHER): Admitting: Pain Medicine

## 2021-06-28 ENCOUNTER — Encounter: Payer: Self-pay | Admitting: Pain Medicine

## 2021-06-28 VITALS — BP 113/73 | HR 84 | Temp 98.2°F | Resp 16 | Ht <= 58 in | Wt 150.0 lb

## 2021-06-28 DIAGNOSIS — M533 Sacrococcygeal disorders, not elsewhere classified: Secondary | ICD-10-CM

## 2021-06-28 DIAGNOSIS — M7061 Trochanteric bursitis, right hip: Secondary | ICD-10-CM

## 2021-06-28 DIAGNOSIS — M7062 Trochanteric bursitis, left hip: Secondary | ICD-10-CM | POA: Insufficient documentation

## 2021-06-28 DIAGNOSIS — G8929 Other chronic pain: Secondary | ICD-10-CM | POA: Diagnosis not present

## 2021-06-28 DIAGNOSIS — M76892 Other specified enthesopathies of left lower limb, excluding foot: Secondary | ICD-10-CM | POA: Diagnosis not present

## 2021-06-28 MED ORDER — LACTATED RINGERS IV SOLN
1000.0000 mL | Freq: Once | INTRAVENOUS | Status: AC
  Administered 2021-06-28: 10:00:00 1000 mL via INTRAVENOUS

## 2021-06-28 MED ORDER — ROPIVACAINE HCL 2 MG/ML IJ SOLN
4.0000 mL | Freq: Once | INTRAMUSCULAR | Status: AC
  Administered 2021-06-28: 10:00:00 4 mL via INTRA_ARTICULAR

## 2021-06-28 MED ORDER — METHYLPREDNISOLONE ACETATE 80 MG/ML IJ SUSP
80.0000 mg | Freq: Once | INTRAMUSCULAR | Status: AC
  Administered 2021-06-28: 10:00:00 80 mg via INTRA_ARTICULAR
  Filled 2021-06-28: qty 1

## 2021-06-28 MED ORDER — LIDOCAINE HCL 2 % IJ SOLN
20.0000 mL | Freq: Once | INTRAMUSCULAR | Status: AC
  Administered 2021-06-28: 10:00:00 400 mg
  Filled 2021-06-28: qty 20

## 2021-06-28 MED ORDER — PENTAFLUOROPROP-TETRAFLUOROETH EX AERO
INHALATION_SPRAY | Freq: Once | CUTANEOUS | Status: DC
  Filled 2021-06-28: qty 116

## 2021-06-28 MED ORDER — ROPIVACAINE HCL 2 MG/ML IJ SOLN
INTRAMUSCULAR | Status: AC
  Filled 2021-06-28: qty 20

## 2021-06-28 MED ORDER — MIDAZOLAM HCL 5 MG/5ML IJ SOLN
0.5000 mg | Freq: Once | INTRAMUSCULAR | Status: AC
  Administered 2021-06-28: 10:00:00 2 mg via INTRAVENOUS
  Filled 2021-06-28: qty 5

## 2021-06-28 NOTE — Patient Instructions (Signed)

## 2021-06-29 ENCOUNTER — Telehealth: Payer: Self-pay | Admitting: *Deleted

## 2021-06-29 NOTE — Telephone Encounter (Signed)
Attempted to call for post procedure follow-up. Message left. 

## 2021-07-02 ENCOUNTER — Telehealth: Payer: Self-pay

## 2021-07-02 NOTE — Telephone Encounter (Signed)
Is wanting a referrral for PT

## 2021-07-11 NOTE — Progress Notes (Signed)
Patient has been exercising and feels that this is aggravating the pain.  Would like an order for PT.  Would prefer [facility] on [address].  Has been there before and she liked that facility.

## 2021-07-12 ENCOUNTER — Ambulatory Visit: Attending: Pain Medicine | Admitting: Pain Medicine

## 2021-07-12 ENCOUNTER — Other Ambulatory Visit: Payer: Self-pay

## 2021-07-12 DIAGNOSIS — M25551 Pain in right hip: Secondary | ICD-10-CM | POA: Insufficient documentation

## 2021-07-12 DIAGNOSIS — M25552 Pain in left hip: Secondary | ICD-10-CM | POA: Insufficient documentation

## 2021-07-12 DIAGNOSIS — M76892 Other specified enthesopathies of left lower limb, excluding foot: Secondary | ICD-10-CM | POA: Insufficient documentation

## 2021-07-12 DIAGNOSIS — M545 Low back pain, unspecified: Secondary | ICD-10-CM | POA: Insufficient documentation

## 2021-07-12 DIAGNOSIS — G894 Chronic pain syndrome: Secondary | ICD-10-CM | POA: Insufficient documentation

## 2021-07-12 DIAGNOSIS — G8929 Other chronic pain: Secondary | ICD-10-CM | POA: Insufficient documentation

## 2021-07-12 NOTE — Progress Notes (Signed)
Patient: Grace Shepard  Service Category: E/M  Provider: Gaspar Cola, MD  DOB: [birth year]  DOS: 07/12/2021  Location: Office  MRN: [id number]  Setting: Ambulatory outpatient  Referring Provider: [name], NP  Type: Established Patient  Specialty: Interventional Pain Management  PCP: Grace Fenton, NP  Location: Remote location  Delivery: TeleHealth     Virtual Encounter - Pain Management PROVIDER NOTE: Information contained herein reflects review and annotations entered in association with encounter. Interpretation of such information and data should be left to medically-trained personnel. Information provided to patient can be located elsewhere in the medical record under "Patient Instructions". Document created using STT-dictation technology, any transcriptional errors that may result from process are unintentional.    Contact & Pharmacy Preferred: [phone] Home: [phone] (home) Mobile: [phone] (mobile) E-mail: [email or web address]  [place] [place] [address] [place] [address] Phone: [phone] Fax: [phone]  [id number] [place] [address] [id number]: [phone]: [phone]  Pre-screening  Ms. WEarleen Newportoffered "in-person" vs "virtual" encounter. She indicated preferring virtual for this encounter.   Reason COVID-19*  Social distancing based on CDC and AMA recommendations.   I contacted TDerrill Kayon 07/12/2021 via telephone.      I clearly identified myself as FGaspar Cola MD. I verified that I was speaking with the correct person using two identifiers (Name: Grace Shepard and date of birth: 801-Jun-1967.  Consent I sought verbal advanced consent from [name] virtual visit interactions. I informed Ms. WEarleen Newportof possible security and privacy concerns, risks, and limitations associated with  providing "not-in-person" medical evaluation and management services. I also informed Ms. WEarleen Newportof the availability of "in-person" appointments. Finally, I informed her that there would be a charge for the virtual visit and that she could be  personally, fully or partially, financially responsible for it. Ms. WCaputoexpressed understanding and agreed to proceed.   Historic Elements   Ms. TZONIE CRUTCHERis a 55y.o. year old, female patient evaluated today after our last contact on 06/28/2021. 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 She also  has a past surgical history that includes Tubal ligation. Ms. WGaminohas a current medication list which includes the following prescription(s): albuterol, baclofen, bupropion, cetirizine, vitamin d3, estradiol, ipratropium, levothyroxine, montelukast, pantoprazole, paroxetine, pregabalin, rosuvastatin, topiramate, trazodone, triamcinolone cream, and vitamin b-12. She  reports that she has never smoked. She has never used smokeless tobacco. She reports that she does not drink alcohol and does not use drugs. Ms. WTersigniis allergic to bactrim [sulfamethoxazole-trimethoprim], gabapentin, and tramadol.   HPI  Today, she is being contacted for a post-procedure assessment.  The patient indicates not having had any benefit for the duration of the local anesthetic but approximately 6 days after the procedure was then she did get a 90% improvement.  Today I have shared with the patient my interpretation of that type of results.  The fact that she did not get any benefit from the local anesthetic would suggest that the etiology of the pain was not residing at the level of the right SI joint or right trochanteric bursa.  The fact that she did get some benefit after 6 days would suggest  that the mechanism sustaining the pain was an inflammatory mechanism, but it did not reside at the injected level.  This information was shared with the patient today.  I have also informed the patient that should the pain start coming back, I would need to repeat her physical exam so that we can further determine where this pain could be coming from.  She understood and accepted.  Post-Procedure Evaluation  Procedure(s):    Procedure #1:  Anesthesia, Analgesia, Anxiolysis:  Type: Diagnostic Sacroiliac Joint Steroid Injection #3  Region: Inferior Lumbosacral Region Level: PSIS (Posterior Superior Iliac Spine) Laterality: Right  Anesthesia: Local (1-2% Lidocaine)  Anxiolysis: None  Sedation: None  Guidance: Fluoroscopy           Position: Prone           Indications: 1. Chronic sacroiliac joint pain (Right)   2. 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 Trochanteric bursitis, right hip   2. Trochanteric bursitis of hip (Bilateral)   3. Greater trochanteric bursitis of hips (Bilateral)   4. Enthesopathy of hip region (Left)    Pain Score: Pre-procedure: 2 /10 Post-procedure: 0-No pain/10    Anxiolysis: Please see nurses note.  Effectiveness during initial hour after procedure (Ultra-Short Term Relief): 0 %.  Local anesthetic used: Long-acting (4-6 hours) Effectiveness: Defined as any analgesic benefit obtained secondary to the administration of local anesthetics. This carries significant diagnostic value as to the etiological location, or anatomical origin, of the pain. Duration of benefit is expected to coincide with the duration of the local anesthetic used.  Effectiveness during initial 4-6 hours after procedure  (Short-Term Relief): 0 %.  Long-term benefit: Defined as any relief past the pharmacologic duration of the local anesthetics.  Effectiveness past the initial 6 hours after procedure (Long-Term Relief): 90 % (took approx 6 days to begin working and is doing pretty well now.).  Benefits, current: Defined as benefit present at the time of this evaluation.   Analgesia: The patient indicates currently enjoying a 90% relief of her pain however, the results of the procedure would argue against the pain coming from the right SI joint or the right trochanteric bursa since she had absolutely no relief of the pain for the duration of the local anesthetic.  The fact that she did get some benefit later on it just means that she had an inflammatory component to this pain and that the systemic effect of the steroids contributed and bring him back pain down. Function: Ms. Petterson reports improvement in function ROM: Ms. Hartgrove reports improvement in ROM  Pharmacotherapy Assessment   Analgesic: None. No opioid analgesics prescribed by our practice. MME/day: 0 mg/day   Monitoring: [place] PMP: PDMP reviewed during this encounter.       Pharmacotherapy: No side-effects or adverse reactions reported. Compliance: No problems identified. Effectiveness: Clinically acceptable. Plan: Refer to "POC". UDS: No results found for: SUMMARY   Laboratory Chemistry Profile   Renal Lab Results  Component Value Date   BUN 13 05/24/2021   CREATININE 0.88 00/93/8182   BCR NOT APPLICABLE 99/37/1696   GFR 64.09 04/11/2020   GFRAA >60 01/17/2020   GFRNONAA 58 (L) 01/17/2020    Hepatic Lab Results  Component Value Date   AST  17 05/24/2021   ALT 25 05/24/2021   ALBUMIN 4.5 04/11/2020   ALKPHOS 106 04/11/2020   LIPASE 24.0 06/15/2015    Electrolytes Lab Results  Component Value Date   NA 142 05/24/2021   K 3.6 05/24/2021   CL 110 05/24/2021   CALCIUM 9.1 05/24/2021   MG 2.2 01/17/2020    Bone Lab Results   Component Value Date   VD25OH 78 03/06/2021    Inflammation (CRP: Acute Phase) (ESR: Chronic Phase) Lab Results  Component Value Date   CRP 1.4 (H) 01/17/2020   ESRSEDRATE 9 01/17/2020         Note: Above Lab results reviewed.  Imaging  DG PAIN CLINIC C-ARM 1-60 MIN NO REPORT Fluoro was used, but no Radiologist interpretation will be provided.  Please refer to "NOTES" tab for provider progress note.  Assessment  The primary encounter diagnosis was Chronic low back pain (1ry area of Pain) (Bilateral) (R>L) w/o sciatica. Diagnoses of Chronic hip pain (Bilateral) (R>L), Enthesopathy of hip region (Left), and Chronic pain syndrome were also pertinent to this visit.  Plan of Care  Problem-specific:  No problem-specific Assessment & Plan notes found for this encounter.  Ms. JADIE ALLINGTON has a current medication list which includes the following long-term medication(s): albuterol, bupropion, ipratropium, levothyroxine, montelukast, pantoprazole, paroxetine, pregabalin, rosuvastatin, topiramate, and trazodone.  Pharmacotherapy (Medications Ordered): No orders of the defined types were placed in this encounter.  Orders:  Orders Placed This Encounter  Procedures   Ambulatory referral to Physical Therapy    Referral Priority:   Routine    Referral Type:   Physical Medicine    Referral Reason:   Specialty Services Required    Requested Specialty:   Physical Therapy    Number of Visits Requested:   1    Follow-up plan:   Return if symptoms worsen or fail to improve.     Interventional Therapies  Risk  Complexity Considerations:   Estimated body mass index is 31.35 kg/m as calculated from the following:   Height as of this encounter: 4' 10"  (1.473 m).   Weight as of this encounter: 150 lb (68 kg). WNL   Planned  Pending:   Pending further evaluation   Under consideration:   Diagnostic bilateral sacroiliac joint block  Diagnostic bilateral trochanteric bursa  injections  Diagnostic bilateral IA hip joint injections  Diagnostic right-sided cervical ESI  Diagnostic right-sided occipital nerve blocks  Possible right occipital nerve RFA  Diagnostic right C2/TON nerve block  Possible right C2/TON RFA    Completed:   Therapeutic right inferior SI joint block x3 (06/28/2021) (0/0/90/90)  Therapeutic left IA hip injection x2 (11/21/2020)  Therapeutic right trochanteric bursa injection x2 (06/28/2021) (0/0/90/90)  Therapeutic left trochanteric bursa injection x2 (11/21/2020)    Therapeutic  Palliative (PRN) options:   Palliative right SI joint block #4 (lower approach)  Palliative right trochanteric bursa injection #3  Palliative left trochanteric bursa injection #3  Palliative left IA hip injection #3     Recent Visits Date Type Provider Dept  06/28/21 Procedure visit Milinda Pointer, MD [facility]  04/30/21 Office Visit Milinda Pointer, MD [facility]  Showing recent visits within past 90 days and meeting all other requirements Today's Visits Date Type Provider Dept  07/12/21 Office Visit Milinda Pointer, MD [facility]  Showing today's visits and meeting all other requirements Future Appointments No visits were found meeting these conditions. Showing future appointments within next 90 days and meeting all  other requirements I discussed the assessment and treatment plan with the patient. The patient was provided an opportunity to ask questions and all were answered. The patient agreed with the plan and demonstrated an understanding of the instructions.  Patient advised to call back or seek an in-person evaluation if the symptoms or condition worsens.  Duration of encounter: 20 minutes.  Note by: Grace Cola, MD Date: 07/12/2021; Time: 5:06 PM

## 2021-07-19 ENCOUNTER — Encounter: Payer: Self-pay | Admitting: Pain Medicine

## 2021-07-23 ENCOUNTER — Other Ambulatory Visit: Payer: Self-pay | Admitting: Dermatology

## 2021-07-23 ENCOUNTER — Other Ambulatory Visit: Payer: Self-pay | Admitting: Internal Medicine

## 2021-07-23 DIAGNOSIS — L308 Other specified dermatitis: Secondary | ICD-10-CM

## 2021-07-23 NOTE — Telephone Encounter (Signed)
Requested medication (s) are due for refill today: NO  Requested medication (s) are on the active medication list: DOSE INCONSISTENT  Last refill:  11/23/20  Future visit scheduled: no  Notes to clinic:   Dose inconsistent with current med list, please assess.  Requested Prescriptions  Pending Prescriptions Disp Refills   buPROPion (WELLBUTRIN XL) 150 MG 24 hr tablet [Pharmacy Med Name: BUPROPION HCL XL TABS 150MG ] 90 tablet 3    Sig: TAKE 1 TABLET DAILY     There is no refill protocol information for this order

## 2021-07-25 ENCOUNTER — Other Ambulatory Visit: Payer: Self-pay | Admitting: Family Medicine

## 2021-07-25 NOTE — Telephone Encounter (Signed)
Rx refilled 06/20/2021 #90 with 1 refill. Pt should have enough medication to last until 12/2021.

## 2021-07-25 NOTE — Telephone Encounter (Signed)
Pt sending to different pharmacy  Requested Prescriptions  Pending Prescriptions Disp Refills   PARoxetine (PAXIL) 10 MG tablet [Pharmacy Med Name: PAROXETINE HCL 10 MG TABLET] 90 tablet 1    Sig: TAKE 1 TABLET BY MOUTH EVERY DAY     Psychiatry:  Antidepressants - SSRI Passed - 07/25/2021  1:42 AM      Passed - Completed PHQ-2 or PHQ-9 in the last 360 days      Passed - Valid encounter within last 6 months    Recent Outpatient Visits          2 months ago Encounter for general adult medical examination with abnormal findings   [facility] [place], Coralie Keens, NP   2 months ago Acute viral syndrome   [place], DO   4 months ago Excessive daytime sleepiness   [facility] [place], Coralie Keens, NP   5 months ago Psychophysiological insomnia   [facility] [place], Coralie Keens, NP   7 months ago Postmenopausal atrophic vaginitis   [facility] [place], Coralie Keens, [place]

## 2021-08-07 ENCOUNTER — Other Ambulatory Visit: Payer: Self-pay | Admitting: Internal Medicine

## 2021-08-17 ENCOUNTER — Other Ambulatory Visit: Payer: Self-pay | Admitting: Internal Medicine

## 2021-08-17 NOTE — Telephone Encounter (Signed)
Requested Prescriptions  Pending Prescriptions Disp Refills   buPROPion (WELLBUTRIN XL) 300 MG 24 hr tablet [Pharmacy Med Name: BUPROPION HCL XL TABS 300MG ] 90 tablet 0    Sig: TAKE 1 TABLET DAILY     Psychiatry: Antidepressants - bupropion Passed - 08/17/2021  2:42 AM      Passed - Completed PHQ-2 or PHQ-9 in the last 360 days      Passed - Last BP in normal range    BP Readings from Last 1 Encounters:  06/28/21 113/73         Passed - Valid encounter within last 6 months    Recent Outpatient Visits          2 months ago Encounter for general adult medical examination with abnormal findings   [facility] [place], Coralie Keens, NP   3 months ago Acute viral syndrome   [place], DO   5 months ago Excessive daytime sleepiness   [facility] [place], Coralie Keens, NP   5 months ago Psychophysiological insomnia   [facility] [place], Coralie Keens, NP   8 months ago Postmenopausal atrophic vaginitis   [facility] [place], Coralie Keens, [place]

## 2021-08-27 ENCOUNTER — Ambulatory Visit: Admitting: Physical Therapy

## 2021-08-29 ENCOUNTER — Encounter: Payer: Self-pay | Admitting: Physical Therapy

## 2021-08-29 ENCOUNTER — Ambulatory Visit: Attending: Pain Medicine | Admitting: Physical Therapy

## 2021-08-29 DIAGNOSIS — G8929 Other chronic pain: Secondary | ICD-10-CM | POA: Insufficient documentation

## 2021-08-29 DIAGNOSIS — M545 Low back pain, unspecified: Secondary | ICD-10-CM | POA: Insufficient documentation

## 2021-08-29 NOTE — Therapy (Addendum)
[facility] [address], [place], [address] Phone: [phone]   Fax:  [phone]  Physical Therapy Evaluation  Patient Details  Name: Grace Shepard MRN: [id number] Date of Birth: 1966-02-04 Referring Provider (PT): Dossie Arbour MD   Encounter Date: 08/29/2021   PT End of Session - 08/30/21 0741     Visit Number 1    Number of Visits 17    Date for PT Re-Evaluation 10/25/21    Authorization - Visit Number 1    Progress Note Due on Visit 10    PT Start Time 1600    PT Stop Time 1645    PT Time Calculation (min) 45 min    Activity Tolerance Patient tolerated treatment well    Behavior During Therapy [facility] for tasks assessed/performed             Past Medical History:  Diagnosis Date   Allergic rhinitis    Arthritis    Depression    Fibromyalgia    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   Hyperlipidemia    Ischemic colitis ([place])    Migraine    Thyroid disease    UTI (lower urinary tract infection)     Past Surgical History:  Procedure Laterality Date   TUBAL LIGATION      There were no vitals filed for this visit.       Subjective Assessment - 08/29/21 1602     Subjective pnt referred for chronic LBP    Pertinent History pnt referred for bursitis that came on after participating in exercise classes. She stopped in September and recieved injections in her bursa bilaterlaly. Pain described as dull and centralized to lower back (points across belt line). 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 relieved by stopping mvmt, applying heat, and taking ibuphropen as needed. pain goes up to 7/10, currently 1/10, can get down to 0/10. pain aggrevated by bending over and lifting things during household and farm chores.Her home has 7 stairs to deck outside and these do not bother her. Pnt has had 2 falls within the last 6 months due to  tripping and balance deficiets. She reports history of dizziness and a 10lb decrease in weight in December. PT encouraged patient to continue to monitor weight flunctuation. No B/B issues, fever, night sweats, unrelently pain that wakes her from sleep, and pnt denies and numbness and tingling.    Limitations House hold activities;Standing;Walking;Lifting    How long can you sit comfortably? unlimted    How long can you stand comfortably? less than 5 min    How long can you walk comfortably? 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    Multiple Pain Sites No                [facility] PT Assessment - 08/30/21 0001       Assessment   Medical Diagnosis Midline LBP    Referring Provider (PT) Dossie Arbour MD    Onset Date/Surgical Date 04/26/21    Next MD Visit none    Prior Therapy yes; familiar to this clinic      Precautions   Precautions None      Restrictions   Weight Bearing Restrictions No      Balance Screen   Has the patient fallen in the past 6 months Yes    How many times? 2    Has the patient had a decrease in activity level because of a fear of falling?  No    Is the patient reluctant to leave  their home because of a fear of falling?  No               Posture: Thoracic kyphosis, FHRS Lower crossed syndrome   Gait Analysis: Varus at bilateral knees throughout gait Bilateral Trendelenburg gait Decreased heel strike at contact with reduced foot clearance bilat  [place] Neuro Screen Dermatomes: WNL Myotomes: WNL Reflexes: not tested today   Motion Lumbar Spine AROM: -flexion: WNL with *concordant pain -extension: WNL -L SB: limited to 30% with *concordant pain -R SB: WNL with *concordant pain -L rotation:WNL -R rotation:WNL  Right Hip AROM -flexion: WNL -extension: not tested  -IR: WNL -ER: WNL -Abduction: WNL -ADDuction: not tested  Left Hip AROM -flexion: WNL -extension: not tested  -IR: WNL -ER: WNL -Abduction: WNL -ADDuction: not tested  Bilat hip PROM = bilat hip AROM  PAM Spine: CPA: tender from L4-L5  Hip: Sacrum: concordant pain  Post. Innominate rotation: limited with no pain Ant. Innominate rotation: WNL  MMT Hip Abductors: 4-/5 bilat Hip ADDuctors : not tested Hip Flexion: 5/5 bilat Hip Extension: Bilateral 3/5 bilat Hip ER : 5/5 bilat Hip IR: 5/5 bilat  Muscle Length Rectus: deferred Hamstring: deferred Hip flexor: deferred   Special Tests Lumbar Radic: -Centralization with Repeated Motions: Negative -SLR: Negative  Hip -FADDIR: Negative -FABER : Negative  SIJ [place] Cluster -Thigh Thrust: positive  -Sacral Thrust :positive  -Compression Test: positive  -Distraction Test: not tested -Gaenslen's Test: not tested   Palpation Increased tone to bilateral lumbar extensors   Therapeutic Exercise PT reviewed the following HEP with patient with patient able to demonstrate a set of the following with min cuing for correction needed. PT educated patient on parameters of therex (how/when to inc/decrease intensity, frequency, rep/set range, stretch hold time, and purpose of therex) with verbalized  understanding.  Cat camel 12x 3x a day  3x 30 seconds sacral CPA with pain relief to 0/10 following; during Pnt educated on heat for relief of muscle tension, with need of restoration of length tension relationship of increasing strength of glutes/core and decreasing tension of lumbar extensors and hip flexors, dx, and HEP.          Objective measurements completed on examination: See above findings.                PT Education - 08/30/21 0740     Education provided Yes    Education Details heat for  muscle stiffness, dx, HEP    Person(s) Educated Patient    Methods Explanation;Tactile cues;Demonstration    Comprehension Verbalized understanding;Returned demonstration              PT Short Term Goals - 08/30/21 0756       PT SHORT TERM GOAL #1   Title pnt will be able to ambulate for more than 5 min with les than 2/10 NPS pain in order to complete homesteading chores    Baseline 08/29/21 less than 5 min    Time 4    Period Weeks    Target Date 09/27/21      PT SHORT TERM GOAL #2   Title pnt will be able to bend over with less than 3/10 pain on NPS to complete ADLs    Baseline 08/29/21 4/10    Time 4    Period Weeks    Target Date 09/27/21      PT SHORT TERM GOAL #3   Title pnt will be able to turn over in bed with less than 4/10 NPS pain in order to complete functional transfers    Baseline 08/29/21 5/10 NPS;    Time 4    Period Weeks    Target Date 09/27/21               PT Long Term Goals - 08/30/21 0802       PT LONG TERM GOAL #1   Title pnt will have decrease in back pain to 5/10 or less at worst to promote ability to tolerate work and household chores    Baseline 08/29/21 7/10 NPS;    Time 8    Period Weeks    Target Date 10/25/21      PT LONG TERM GOAL #2   Title pnt will improve bilateral hip abductor MMT grade by 1/2 to improve gait and funcitonal activites    Baseline 08/29/21 4-/5    Time 8    Period Weeks    Target Date  10/25/21      PT LONG TERM GOAL #3   Title pnt will be improve bilateral glute strength by 1 MMT grade to improve function and ability to perform functional activites    Baseline 08/29/21 3/5 MMT    Time 8    Period Weeks    Target Date 10/25/21                    Plan - 08/30/21 0744     Clinical Impression Statement 56 y.o. female presented with chronic LBP since September 2022. Impairments include: deficiets in hip abductor strength, hip extensor strength, biomechnaics, lumbopelvic dissociation, and pain. This leads to activity limitations in bending, using stairs, lifting, squatting, and transfers; inhibiting participation in regular exercise routine, and homesteading and household ADLs. SPT performed CPA PA grade 1-2 mobs on the sacrum which improved pnt NPS from 1/10 to 0/10. 12x cat-camel dynamic mvmt given to pnt in order to maintain new sacral mobility. Pnt educated on HEP and will bennefit from PT cueing, education, and progression of ther-ex to addresss deficets in order to improve pnt's pain during daily functional activites. Pt will benefit from skilled PT to address aforementioned impairments to return to optimal, safe PLOF.    Personal Factors and Comorbidities Comorbidity 3+;Time since onset of injury/illness/exacerbation;Fitness;Past/Current Experience    Comorbidities HLD, arthritis, fibromyalgia, chronic LBP    Examination-Activity Limitations Bend;Transfers;Carry;Lift    Examination-Participation Restrictions Occupation;Cleaning;Yard Work    Production manager  complexity    Clinical Decision Making Moderate    Rehab Potential Good    Clinical Impairments Affecting Rehab Potential Chronicity of condition    PT Frequency 2x / week    PT Treatment/Interventions Electrical Stimulation;Moist Heat;Stair training;Functional mobility training;Therapeutic activities;Therapeutic exercise;Balance training;Neuromuscular re-education;Cognitive  remediation;Patient/family education;Manual techniques;Passive range of motion;Dry needling;Energy conservation;Spinal Manipulations;Joint Manipulations;Cryotherapy;ADLs/Self Care Home Management;Iontophoresis 4mg /ml Dexamethasone;Ultrasound;Gait training    PT Next Visit Plan muscle length testing, lateral/forward stepdown test, SLS test, joint mobilization, TA stregthening, fascial mobilization    PT Home Exercise Plan cat/cow, heat    Consulted and Agree with Plan of Care Patient             Patient will benefit from skilled therapeutic intervention in order to improve the following deficits and impairments:  Hypomobility, Increased fascial restricitons, Decreased strength, Pain, Abnormal gait, Decreased activity tolerance, Decreased balance, Decreased mobility, Decreased range of motion, Difficulty walking, Impaired flexibility, Impaired tone, Postural dysfunction, Improper body mechanics  Visit Diagnosis: Chronic midline low back pain without sciatica     Problem List Patient Active Problem List   Diagnosis Date Noted   Class 1 obesity due to excess calories with body mass index (BMI) of 31.0 to 31.9 in adult 02/19/2021   Eczema 12/11/2020   Postmenopausal atrophic vaginitis 12/11/2020   Other spondylosis, sacral and sacrococcygeal region 02/08/2020   Enthesopathy of hip region (Left) 02/08/2020   Greater trochanteric bursitis of hips (Bilateral) 02/08/2020   Chronic sacroiliac joint pain (Right) 02/07/2020   Sacroiliac joint dysfunction (Bilateral) (R>L) 02/07/2020   Trochanteric bursitis of hip (Bilateral) 02/07/2020   Chronic hip pain (Left) 02/07/2020   Trochanteric bursitis, right hip 02/07/2020   Chronic pain syndrome 01/17/2020   Chronic lower extremity pain (2ry area of Pain) (Bilateral) (R>L) 01/17/2020   Chronic hip pain (Bilateral) (R>L) 01/17/2020   Chronic upper extremity pain (Bilateral) 01/17/2020   Cervicalgia (Right) 01/17/2020   Chronic occipital headache  (Right) 01/17/2020   Cervicogenic headache (Right) 01/17/2020   Occipital neuralgia (lesser occipital nerve) (Right) 01/17/2020   Cervical facet syndrome (Right) 01/17/2020   Cervico-occipital neuralgia (Right) 01/17/2020   Chronic sacroiliac joint pain (Bilateral) (R>L) 01/17/2020   Lumbar facet syndrome (Bilateral) (R>L) 01/17/2020   HLD (hyperlipidemia) 03/04/2019   Anxiety and depression 05/20/2016   GERD (gastroesophageal reflux disease) 06/15/2015   Chronic low back pain (1ry area of Pain) (Bilateral) (R>L) w/o sciatica 06/02/2015   Insomnia 05/22/2015   Fibromyalgia 04/21/2015   Migraines 04/21/2015   Acquired hypothyroidism 12/19/2014   Claiborne Billings O'Daniel, SPT Durwin Reges DPT Durwin Reges, PT 08/30/2021, 9:48 AM  [facility] [address] [place], [address] Phone: [phone]   Fax:  [phone]  Name: Grace Shepard MRN: [id number] Date of Birth: 12/23/1965

## 2021-08-30 ENCOUNTER — Encounter: Payer: Self-pay | Admitting: Physical Therapy

## 2021-08-30 NOTE — Addendum Note (Signed)
Addended by: Kelton Pillar on: 08/30/2021 09:50 AM   Modules accepted: Orders

## 2021-09-03 ENCOUNTER — Ambulatory Visit: Admitting: Physical Therapy

## 2021-09-03 ENCOUNTER — Other Ambulatory Visit: Payer: Self-pay

## 2021-09-03 DIAGNOSIS — M545 Low back pain, unspecified: Secondary | ICD-10-CM | POA: Diagnosis not present

## 2021-09-03 NOTE — Therapy (Signed)
[facility] 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   Hyperlipidemia    Ischemic colitis ([place])    Migraine    Thyroid disease    UTI (lower urinary tract infection)     Past Surgical History:  Procedure Laterality Date   TUBAL LIGATION      There were no vitals filed for this visit.     Mannual Therapy Sacral PA mobs 3x 30 seconds grade 3  Therex Nu Step for 5 min to increase sacrolumbar mobility Spine TA draw in with marches 3 x 6 with min cueing for TA activation Hooklying lumbar rotation x12 cuing for lumbopelvic dissociation Sit to Stand 2 x 8 with pnt self tactile cueing to "zip up" her core                        PT Short Term Goals - 08/30/21 0756       PT SHORT TERM GOAL #1   Title pnt will be able to ambulate for more than 5 min with les than 2/10 NPS pain in order to complete homesteading chores    Baseline 08/29/21 less than 5 min    Time 4    Period Weeks    Target Date 09/27/21      PT SHORT TERM GOAL #2   Title pnt will be able to bend over with less than 3/10 pain on NPS to complete ADLs    Baseline 08/29/21 4/10    Time 4    Period Weeks    Target Date 09/27/21      PT SHORT TERM GOAL #3   Title pnt will be able to turn over in bed with less than 4/10 NPS pain in order to complete functional transfers    Baseline 08/29/21 5/10 NPS;    Time 4    Period Weeks    Target Date 09/27/21               PT Long Term Goals - 08/30/21 0802       PT LONG TERM GOAL #1    Title pnt will have decrease in back pain to 5/10 or less at worst to promote ability to tolerate work and household chores    Baseline 08/29/21 7/10 NPS;    Time 8    Period Weeks    Target Date 10/25/21      PT LONG TERM GOAL #2   Title pnt will improve bilateral hip abductor MMT grade by 1/2 to improve gait and funcitonal activites    Baseline 08/29/21 4-/5    Time 8    Period Weeks    Target Date 10/25/21      PT LONG TERM GOAL #3   Title pnt will be improve bilateral glute strength by 1 MMT grade to improve function and ability to perform functional activites    Baseline 08/29/21 3/5 MMT    Time 8    Period Weeks    Target Date 10/25/21  Patient will benefit from skilled therapeutic intervention in order to improve the following deficits and impairments:  Hypomobility, Increased fascial restricitons, Decreased strength, Pain, Abnormal gait, Decreased activity tolerance, Decreased balance, Decreased mobility, Decreased range of motion, Difficulty walking, Impaired flexibility, Impaired tone, Postural dysfunction, Improper body mechanics  Visit Diagnosis: Chronic midline low back pain without sciatica     Problem List Patient Active Problem List   Diagnosis Date Noted   Class 1 obesity due to excess calories with body mass index (BMI) of 31.0 to 31.9 in adult 02/19/2021   Eczema 12/11/2020   Postmenopausal atrophic vaginitis 12/11/2020   Other spondylosis, sacral and sacrococcygeal region 02/08/2020   Enthesopathy of hip region (Left) 02/08/2020   Greater trochanteric bursitis of hips (Bilateral) 02/08/2020   Chronic sacroiliac joint pain (Right) 02/07/2020   Sacroiliac joint dysfunction (Bilateral) (R>L) 02/07/2020   Trochanteric bursitis of hip (Bilateral) 02/07/2020   Chronic hip pain (Left) 02/07/2020   Trochanteric bursitis, right hip 02/07/2020   Chronic pain syndrome 01/17/2020   Chronic lower extremity pain (2ry area of Pain)  (Bilateral) (R>L) 01/17/2020   Chronic hip pain (Bilateral) (R>L) 01/17/2020   Chronic upper extremity pain (Bilateral) 01/17/2020   Cervicalgia (Right) 01/17/2020   Chronic occipital headache (Right) 01/17/2020   Cervicogenic headache (Right) 01/17/2020   Occipital neuralgia (lesser occipital nerve) (Right) 01/17/2020   Cervical facet syndrome (Right) 01/17/2020   Cervico-occipital neuralgia (Right) 01/17/2020   Chronic sacroiliac joint pain (Bilateral) (R>L) 01/17/2020   Lumbar facet syndrome (Bilateral) (R>L) 01/17/2020   HLD (hyperlipidemia) 03/04/2019   Anxiety and depression 05/20/2016   GERD (gastroesophageal reflux disease) 06/15/2015   Chronic low back pain (1ry area of Pain) (Bilateral) (R>L) w/o sciatica 06/02/2015   Insomnia 05/22/2015   Fibromyalgia 04/21/2015   Migraines 04/21/2015   Acquired hypothyroidism 12/19/2014     Durwin Reges DPT Claiborne Billings O'Daniel, SPT Durwin Reges, PT 09/05/2021, 8:22 AM  [facility] [address], [place], [address] Phone: [phone]   Fax:  [phone]  Name: Grace Shepard MRN: [id number] Date of Birth: 10-Sep-1965

## 2021-09-05 ENCOUNTER — Other Ambulatory Visit: Payer: Self-pay

## 2021-09-05 ENCOUNTER — Encounter: Payer: Self-pay | Admitting: Physical Therapy

## 2021-09-05 ENCOUNTER — Ambulatory Visit: Admitting: Physical Therapy

## 2021-09-05 DIAGNOSIS — M545 Low back pain, unspecified: Secondary | ICD-10-CM | POA: Diagnosis not present

## 2021-09-05 DIAGNOSIS — G8929 Other chronic pain: Secondary | ICD-10-CM

## 2021-09-05 NOTE — Therapy (Signed)
[facility] [address], [place], [address] Phone: [phone]   Fax:  [phone]  Physical Therapy Treatment  Patient Details  Name: Grace Shepard MRN: [id number] Date of Birth: Jul 03, 1966 Referring Provider (PT): Dossie Arbour MD   Encounter Date: 09/05/2021   PT End of Session - 09/05/21 1648     Visit Number 3    Number of Visits 17    Date for PT Re-Evaluation 10/25/21    Authorization - Visit Number 3    Authorization - Number of Visits 17    Progress Note Due on Visit 10    PT Start Time 1600    PT Stop Time 1645    PT Time Calculation (min) 45 min    Activity Tolerance Patient tolerated treatment well    Behavior During Therapy [facility] for tasks assessed/performed             Past Medical History:  Diagnosis Date   Allergic rhinitis    Arthritis    Depression    Fibromyalgia    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   Hyperlipidemia    Ischemic colitis ([place])    Migraine    Thyroid disease    UTI (lower urinary tract infection)     Past Surgical History:  Procedure Laterality Date   TUBAL LIGATION      There were no vitals filed for this visit.   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 pnt reports TA activation has helped with household chores                Theur ex Nu Step for 5 min to increase lumbosacral mobility  Supine TA marches with YTB resisting arm x10 - pnt reports this caused pain in right SIJ towards the end of the set Supine TA marches 2 x 10- pnt reports no pain LTR 2x12 Standing hip abduction YTB 3 x 6  Squatting 3 x10 with good carry over from last session                           PT Education - 09/05/21 1647     Education provided Yes    Education Details therex form, lifting biomechanics, HEP    Person(s) Educated Patient     Methods Explanation;Demonstration;Verbal cues    Comprehension Verbalized understanding;Returned demonstration              PT Short Term Goals - 08/30/21 0756       PT SHORT TERM GOAL #1   Title pnt will be able to ambulate for more than 5 min with les than 2/10 NPS pain in order to complete homesteading chores    Baseline 08/29/21 less than 5 min    Time 4    Period Weeks    Target Date 09/27/21      PT SHORT TERM GOAL #2   Title pnt will be able to bend over with less than 3/10 pain on NPS to complete ADLs    Baseline 08/29/21 4/10    Time 4    Period Weeks    Target Date 09/27/21      PT SHORT TERM GOAL #3   Title pnt will be able to turn over in bed with less than 4/10 NPS pain in order to complete functional transfers  Baseline 08/29/21 5/10 NPS;    Time 4    Period Weeks    Target Date 09/27/21               PT Long Term Goals - 08/30/21 0802       PT LONG TERM GOAL #1   Title pnt will have decrease in back pain to 5/10 or less at worst to promote ability to tolerate work and household chores    Baseline 08/29/21 7/10 NPS;    Time 8    Period Weeks    Target Date 10/25/21      PT LONG TERM GOAL #2   Title pnt will improve bilateral hip abductor MMT grade by 1/2 to improve gait and funcitonal activites    Baseline 08/29/21 4-/5    Time 8    Period Weeks    Target Date 10/25/21      PT LONG TERM GOAL #3   Title pnt will be improve bilateral glute strength by 1 MMT grade to improve function and ability to perform functional activites    Baseline 08/29/21 3/5 MMT    Time 8    Period Weeks    Target Date 10/25/21                   Plan - 09/05/21 1649     Clinical Impression Statement PT progressed therex to increase TA endurance and hip abductor strength. Pnt responded well to verbal cues on TA recruitment with good carry over. Pnt demonstrated understanding of therex and HEP,and had good motivation throughout the session. PT will progress  as able.    Personal Factors and Comorbidities Comorbidity 3+;Time since onset of injury/illness/exacerbation;Fitness;Past/Current Experience    Comorbidities HLD, arthritis, fibromyalgia, chronic LBP    Examination-Activity Limitations Bend;Transfers;Carry;Lift    Examination-Participation Restrictions Occupation;Cleaning;Yard Work    [employer] Evolving/Moderate complexity    Clinical Decision Making Moderate    Rehab Potential Good    Clinical Impairments Affecting Rehab Potential Chronicity of condition    PT Frequency 2x / week    PT Duration 4 weeks    PT Treatment/Interventions Electrical Stimulation;Moist Heat;Stair training;Functional mobility training;Therapeutic activities;Therapeutic exercise;Balance training;Neuromuscular re-education;Cognitive remediation;Patient/family education;Manual techniques;Passive range of motion;Dry needling;Energy conservation;Spinal Manipulations;Joint Manipulations;Cryotherapy;ADLs/Self Care Home Management;Iontophoresis 4mg /ml Dexamethasone;Ultrasound;Gait training    PT Next Visit Plan muscle length testing, lateral/forward stepdown test, SLS test, joint mobilization, TA stregthening, fascial mobilization    PT Home Exercise Plan standing hip abduction    Consulted and Agree with Plan of Care Patient             Patient will benefit from skilled therapeutic intervention in order to improve the following deficits and impairments:  Hypomobility, Increased fascial restricitons, Decreased strength, Pain, Abnormal gait, Decreased activity tolerance, Decreased balance, Decreased mobility, Decreased range of motion, Difficulty walking, Impaired flexibility, Impaired tone, Postural dysfunction, Improper body mechanics  Visit Diagnosis: Chronic midline low back pain without sciatica     Problem List Patient Active Problem List   Diagnosis Date Noted   Class 1 obesity due to excess calories with body mass index (BMI) of 31.0  to 31.9 in adult 02/19/2021   Eczema 12/11/2020   Postmenopausal atrophic vaginitis 12/11/2020   Other spondylosis, sacral and sacrococcygeal region 02/08/2020   Enthesopathy of hip region (Left) 02/08/2020   Greater trochanteric bursitis of hips (Bilateral) 02/08/2020   Chronic sacroiliac joint pain (Right) 02/07/2020   Sacroiliac joint dysfunction (Bilateral) (R>L) 02/07/2020   Trochanteric bursitis of hip (  Bilateral) 02/07/2020   Chronic hip pain (Left) 02/07/2020   Trochanteric bursitis, right hip 02/07/2020   Chronic pain syndrome 01/17/2020   Chronic lower extremity pain (2ry area of Pain) (Bilateral) (R>L) 01/17/2020   Chronic hip pain (Bilateral) (R>L) 01/17/2020   Chronic upper extremity pain (Bilateral) 01/17/2020   Cervicalgia (Right) 01/17/2020   Chronic occipital headache (Right) 01/17/2020   Cervicogenic headache (Right) 01/17/2020   Occipital neuralgia (lesser occipital nerve) (Right) 01/17/2020   Cervical facet syndrome (Right) 01/17/2020   Cervico-occipital neuralgia (Right) 01/17/2020   Chronic sacroiliac joint pain (Bilateral) (R>L) 01/17/2020   Lumbar facet syndrome (Bilateral) (R>L) 01/17/2020   HLD (hyperlipidemia) 03/04/2019   Anxiety and depression 05/20/2016   GERD (gastroesophageal reflux disease) 06/15/2015   Chronic low back pain (1ry area of Pain) (Bilateral) (R>L) w/o sciatica 06/02/2015   Insomnia 05/22/2015   Fibromyalgia 04/21/2015   Migraines 04/21/2015   Acquired hypothyroidism 12/19/2014      Claiborne Billings O'Daniel, SPT Durwin Reges, PT 09/06/2021, 1:56 PM  [facility] [address] [place], [address] Phone: [phone]   Fax:  [phone]  Name: RAAHI KORBER MRN: [id number] Date of Birth: 1965/11/18

## 2021-09-10 ENCOUNTER — Encounter: Payer: Self-pay | Admitting: Physical Therapy

## 2021-09-10 ENCOUNTER — Encounter: Payer: Self-pay | Admitting: Internal Medicine

## 2021-09-10 ENCOUNTER — Other Ambulatory Visit: Payer: Self-pay

## 2021-09-10 ENCOUNTER — Ambulatory Visit: Admitting: Physical Therapy

## 2021-09-10 DIAGNOSIS — M545 Low back pain, unspecified: Secondary | ICD-10-CM

## 2021-09-10 DIAGNOSIS — G8929 Other chronic pain: Secondary | ICD-10-CM

## 2021-09-10 NOTE — Therapy (Signed)
[facility] [address] [place], [address] Phone: [phone]   Fax:  [phone]  Physical Therapy Treatment  Patient Details  Name: Grace Shepard MRN: [id number] Date of Birth: [birth year] Referring Provider (PT): Dossie Arbour MD   Encounter Date: 09/10/2021   PT End of Session - 09/10/21 1532     Visit Number 4    Number of Visits 17    Date for PT Re-Evaluation 10/25/21    Authorization - Visit Number 4    Authorization - Number of Visits 17    Progress Note Due on Visit 10    PT Start Time 1430    PT Stop Time 1515    PT Time Calculation (min) 45 min    Activity Tolerance Patient tolerated treatment well    Behavior During Therapy [facility] for tasks assessed/performed             Past Medical History:  Diagnosis Date   Allergic rhinitis    Arthritis    Depression    Fibromyalgia    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   Hyperlipidemia    Ischemic colitis ([place])    Migraine    Thyroid disease    UTI (lower urinary tract infection)     Past Surgical History:  Procedure Laterality Date   TUBAL LIGATION      There were no vitals filed for this visit.   Subjective Assessment - 09/10/21 1433     Subjective pnt reports doing a lot of chores this weekend and hurting from all the bending over. pnt reports currently 4/10 pain but this morning it was 8/10.    Pertinent History pnt referred for bursitis that came on after participating in exercise classes. She stopped in September and recieved injections in her bursa bilaterlaly. Pain described as dull and centralized to lower back (points across belt line). 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 relieved by stopping mvmt, applying heat, and taking ibuphropen as needed. pain goes up to 7/10, currently 1/10, can get down to 0/10. pain aggrevated by bending over and lifting things during  household and farm chores.Her home has 7 stairs to deck outside and these do not bother her. Pnt has had 2 falls within the last 6 months due to tripping and balance deficiets. She reports history of dizziness and a 10lb decrease in weight in December. PT encouraged patient to continue to monitor weight flunctuation. No B/B issues, fever, night sweats, unrelently pain that wakes her from sleep, and pnt denies and numbness and tingling.    Limitations House hold activities;Standing;Walking;Lifting    How long can you sit comfortably? unlimted    How long can you stand comfortably? less than 5 min    How long can you walk comfortably? 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 Yes    Pain Score 4     Pain Location Sacrum    Pain Orientation Mid;Lower    Pain Descriptors / Indicators Dull    Pain Type Chronic pain  Pain Onset More than a month ago    Pain Frequency Intermittent              Mannaul therapy TP release to bilateral lumbar extensors  and bilateral gluteus medius for 10 min.     Theur ex Nu Step for 5 min to increase lumbosacral mobility  [employer] for 30 sec Glute Bridges 3 x6- min cueing for TA activation LTR 2 x 12 Double knee to chest stretch for 30 sec. Standing hip abduction YTB 3 x6 - cueing to maintain a neutral spine Bilateral Piriformis stretch 30 sec each side  Squatting 3 x 8 with 7# weight  Seated ball roll out alternating sides 2 x 30 sec                       PT Education - 09/10/21 1531     Education provided Yes    Education Details therex form, HEP    Person(s) Educated Patient    Methods  Explanation;Demonstration;Verbal cues    Comprehension Verbalized understanding;Returned demonstration              PT Short Term Goals - 08/30/21 0756       PT SHORT TERM GOAL #1   Title pnt will be able to ambulate for more than 5 min with les than 2/10 NPS pain in order to complete homesteading chores    Baseline 08/29/21 less than 5 min    Time 4    Period Weeks    Target Date 09/27/21      PT SHORT TERM GOAL #2   Title pnt will be able to bend over with less than 3/10 pain on NPS to complete ADLs    Baseline 08/29/21 4/10    Time 4    Period Weeks    Target Date 09/27/21      PT SHORT TERM GOAL #3   Title pnt will be able to turn over in bed with less than 4/10 NPS pain in order to complete functional transfers    Baseline 08/29/21 5/10 NPS;    Time 4    Period Weeks    Target Date 09/27/21               PT Long Term Goals - 08/30/21 0802       PT LONG TERM GOAL #1   Title pnt will have decrease in back pain to 5/10 or less at worst to promote ability to tolerate work and household chores    Baseline 08/29/21 7/10 NPS;    Time 8    Period Weeks    Target Date 10/25/21      PT LONG TERM GOAL #2   Title pnt will improve bilateral hip abductor MMT grade by 1/2 to improve gait and funcitonal activites    Baseline 08/29/21 4-/5    Time 8    Period Weeks    Target Date 10/25/21      PT LONG TERM GOAL #3   Title pnt will be improve bilateral glute strength by 1 MMT grade to improve function and ability to perform functional activites    Baseline 08/29/21 3/5 MMT    Time 8    Period Weeks    Target Date 10/25/21                   Plan - 09/10/21 1532     Clinical Impression Statement pnt presented with pain in bilateral lumbar extensors  and bilateral glute med. spt performed progressive TP release and pnt reported that this decreased her pain. spt progressed therex to increase TA endurance and hip abductor strength. PT will progress as able.     Personal Factors and Comorbidities Comorbidity 3+;Time since onset of injury/illness/exacerbation;Fitness;Past/Current Experience    Comorbidities HLD, arthritis, fibromyalgia, chronic LBP    Examination-Activity Limitations Bend;Transfers;Carry;Lift    Examination-Participation Restrictions Occupation;Cleaning;Yard Work    [employer] officer Evolving/Moderate complexity    Clinical Decision Making Moderate    Rehab Potential Good    Clinical Impairments Affecting Rehab Potential Chronicity of condition    PT Frequency 2x / week    PT Duration 4 weeks    PT Treatment/Interventions Electrical Stimulation;Moist Heat;Stair training;Functional mobility training;Therapeutic activities;Therapeutic exercise;Balance training;Neuromuscular re-education;Cognitive remediation;Patient/family education;Manual techniques;Passive range of motion;Dry needling;Energy conservation;Spinal Manipulations;Joint Manipulations;Cryotherapy;ADLs/Self Care Home Management;Iontophoresis 4mg /ml Dexamethasone;Ultrasound;Gait training    PT Next Visit Plan muscle length testing, lateral/forward stepdown test, SLS test, joint mobilization, TA stregthening, fascial mobilization    PT Home Exercise Plan standing hip abduction    Consulted and Agree with Plan of Care Patient             Patient will benefit from skilled therapeutic intervention in order to improve the following deficits and impairments:  Hypomobility, Increased fascial restricitons, Decreased strength, Pain, Abnormal gait, Decreased activity tolerance, Decreased balance, Decreased mobility, Decreased range of motion, Difficulty walking, Impaired flexibility, Impaired tone, Postural dysfunction, Improper body mechanics  Visit Diagnosis: Chronic midline low back pain without sciatica     Problem List Patient Active Problem List   Diagnosis Date Noted   Class 1 obesity due to excess calories with body mass index (BMI) of 31.0 to 31.9 in  adult 02/19/2021   Eczema 12/11/2020   Postmenopausal atrophic vaginitis 12/11/2020   Other spondylosis, sacral and sacrococcygeal region 02/08/2020   Enthesopathy of hip region (Left) 02/08/2020   Greater trochanteric bursitis of hips (Bilateral) 02/08/2020   Chronic sacroiliac joint pain (Right) 02/07/2020   Sacroiliac joint dysfunction (Bilateral) (R>L) 02/07/2020   Trochanteric bursitis of hip (Bilateral) 02/07/2020   Chronic hip pain (Left) 02/07/2020   Trochanteric bursitis, right hip 02/07/2020   Chronic pain syndrome 01/17/2020   Chronic lower extremity pain (2ry area of Pain) (Bilateral) (R>L) 01/17/2020   Chronic hip pain (Bilateral) (R>L) 01/17/2020   Chronic upper extremity pain (Bilateral) 01/17/2020   Cervicalgia (Right) 01/17/2020   Chronic occipital headache (Right) 01/17/2020   Cervicogenic headache (Right) 01/17/2020   Occipital neuralgia (lesser occipital nerve) (Right) 01/17/2020   Cervical facet syndrome (Right) 01/17/2020   Cervico-occipital neuralgia (Right) 01/17/2020   Chronic sacroiliac joint pain (Bilateral) (R>L) 01/17/2020   Lumbar facet syndrome (Bilateral) (R>L) 01/17/2020   HLD (hyperlipidemia) 03/04/2019   Anxiety and depression 05/20/2016   GERD (gastroesophageal reflux disease) 06/15/2015   Chronic low back pain (1ry area of Pain) (Bilateral) (R>L) w/o sciatica 06/02/2015   Insomnia 05/22/2015   Fibromyalgia 04/21/2015   Migraines 04/21/2015   Acquired hypothyroidism 12/19/2014    Durwin Reges DPT Claiborne Billings O'Daniel, SPT Durwin Reges, PT 09/11/2021, 3:01 PM  [facility] [address] [facility] [address] [place], [address] Phone: [phone]   Fax:  [phone]  Name: Grace Shepard MRN: [id number] Date of Birth: Jul 03, 1966

## 2021-09-12 ENCOUNTER — Encounter: Payer: Self-pay | Admitting: Pain Medicine

## 2021-09-12 ENCOUNTER — Other Ambulatory Visit: Payer: Self-pay

## 2021-09-12 ENCOUNTER — Encounter: Payer: Self-pay | Admitting: Physical Therapy

## 2021-09-12 ENCOUNTER — Ambulatory Visit: Attending: Pain Medicine | Admitting: Physical Therapy

## 2021-09-12 DIAGNOSIS — M545 Low back pain, unspecified: Secondary | ICD-10-CM | POA: Insufficient documentation

## 2021-09-12 DIAGNOSIS — G8929 Other chronic pain: Secondary | ICD-10-CM | POA: Insufficient documentation

## 2021-09-12 NOTE — Therapy (Signed)
[facility] [address] [place], [address] Phone: [phone]   Fax:  [phone]  Physical Therapy Treatment  Patient Details  Name: Grace Shepard MRN: [id number] Date of Birth: May 27, 1966 Referring Provider (PT): Dossie Arbour MD   Encounter Date: 09/12/2021   PT End of Session - 09/12/21 1415     Visit Number 5    Number of Visits 17    Date for PT Re-Evaluation 10/25/21    Authorization - Visit Number 5    Authorization - Number of Visits 17    Progress Note Due on Visit 10    PT Start Time 0932    PT Stop Time 1430    PT Time Calculation (min) 45 min    Activity Tolerance Patient tolerated treatment well    Behavior During Therapy [facility] for tasks assessed/performed             Past Medical History:  Diagnosis Date   Allergic rhinitis    Arthritis    Depression    Fibromyalgia    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   Hyperlipidemia    Ischemic colitis ([place])    Migraine    Thyroid disease    UTI (lower urinary tract infection)     Past Surgical History:  Procedure Laterality Date   TUBAL LIGATION      There were no vitals filed for this visit.   Subjective Assessment - 09/12/21 1351     Subjective pnt reports 1/10 and HEP going well.    Pertinent History pnt referred for bursitis that came on after participating in exercise classes. She stopped in September and recieved injections in her bursa bilaterlaly. Pain described as dull and centralized to lower back (points across belt line). 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 relieved by stopping mvmt, applying heat, and taking ibuphropen as needed. pain goes up to 7/10, currently 1/10, can get down to 0/10. pain aggrevated by bending over and lifting things during household and farm chores.Her home has 7 stairs to deck outside and these do not bother her. Pnt has had 2 falls  within the last 6 months due to tripping and balance deficiets. She reports history of dizziness and a 10lb decrease in weight in December. PT encouraged patient to continue to monitor weight flunctuation. No B/B issues, fever, night sweats, unrelently pain that wakes her from sleep, and pnt denies and numbness and tingling.    Limitations House hold activities;Standing;Walking;Lifting    How long can you sit comfortably? unlimted    How long can you stand comfortably? less than 5 min    How long can you walk comfortably? 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 Yes    Pain Score 1     Pain Location Sacrum    Pain Orientation Mid;Lower    Pain Descriptors / Indicators Dull    Pain Type Chronic pain    Pain Onset More than a month ago    Pain Frequency Intermittent    Multiple Pain Sites  No                 Mannaul therapy TP release to bilateral lumbar extensors  and bilateral gluteus medius for 10 min.  Lumbopelvic distraction 3x 30 seconds    Theur ex Nu Step for 5 min to increase lumbosacral mobility  LTR x12  Supine marching 3 x 10- good carry over from last session Squatting 3 x 8 with 7# weight - good carry over from last session                         PT Education - 09/12/21 1414     Education provided Yes    Education Details therex form    Person(s) Educated Patient    Methods Explanation;Demonstration;Verbal cues    Comprehension Verbalized understanding;Returned demonstration              PT Short Term Goals - 08/30/21 0756       PT SHORT TERM GOAL #1   Title pnt will be able to ambulate for more than 5  min with les than 2/10 NPS pain in order to complete homesteading chores    Baseline 08/29/21 less than 5 min    Time 4    Period Weeks    Target Date 09/27/21      PT SHORT TERM GOAL #2   Title pnt will be able to bend over with less than 3/10 pain on NPS to complete ADLs    Baseline 08/29/21 4/10    Time 4    Period Weeks    Target Date 09/27/21      PT SHORT TERM GOAL #3   Title pnt will be able to turn over in bed with less than 4/10 NPS pain in order to complete functional transfers    Baseline 08/29/21 5/10 NPS;    Time 4    Period Weeks    Target Date 09/27/21               PT Long Term Goals - 08/30/21 0802       PT LONG TERM GOAL #1   Title pnt will have decrease in back pain to 5/10 or less at worst to promote ability to tolerate work and household chores    Baseline 08/29/21 7/10 NPS;    Time 8    Period Weeks    Target Date 10/25/21      PT LONG TERM GOAL #2   Title pnt will improve bilateral hip abductor MMT grade by 1/2 to improve gait and funcitonal activites    Baseline 08/29/21 4-/5    Time 8    Period Weeks    Target Date 10/25/21      PT LONG TERM GOAL #3   Title pnt will be improve bilateral glute strength by 1 MMT grade to improve function and ability to perform functional activites    Baseline 08/29/21 3/5 MMT    Time 8    Period Weeks    Target Date 10/25/21                   Plan - 09/12/21 1415     Clinical Impression Statement pnt presented with pain on right glute med. spt performed mannual therapy and therex to increase lumbosacral mobility. pnt tolerated treatment well with min cueing. Decreased tissue tension following manual techniques and patient reported no pain. PT will progress as able.    Personal Factors and Comorbidities  Comorbidity 3+;Time since onset of injury/illness/exacerbation;Fitness;Past/Current Experience    Comorbidities HLD, arthritis, fibromyalgia, chronic LBP    Examination-Activity Limitations  Bend;Transfers;Carry;Lift    Examination-Participation Restrictions Occupation;Cleaning;Yard Work    [employer] officer Evolving/Moderate complexity    Clinical Decision Making Moderate    Rehab Potential Good    Clinical Impairments Affecting Rehab Potential Chronicity of condition    PT Frequency 2x / week    PT Duration 4 weeks    PT Treatment/Interventions Electrical Stimulation;Moist Heat;Stair training;Functional mobility training;Therapeutic activities;Therapeutic exercise;Balance training;Neuromuscular re-education;Cognitive remediation;Patient/family education;Manual techniques;Passive range of motion;Dry needling;Energy conservation;Spinal Manipulations;Joint Manipulations;Cryotherapy;ADLs/Self Care Home Management;Iontophoresis 4mg /ml Dexamethasone;Ultrasound;Gait training    PT Next Visit Plan muscle length testing, lateral/forward stepdown test, SLS test, joint mobilization, TA stregthening, fascial mobilization    PT Home Exercise Plan standing hip abduction    Consulted and Agree with Plan of Care Patient             Patient will benefit from skilled therapeutic intervention in order to improve the following deficits and impairments:  Hypomobility, Increased fascial restricitons, Decreased strength, Pain, Abnormal gait, Decreased activity tolerance, Decreased balance, Decreased mobility, Decreased range of motion, Difficulty walking, Impaired flexibility, Impaired tone, Postural dysfunction, Improper body mechanics  Visit Diagnosis: Chronic midline low back pain without sciatica     Problem List Patient Active Problem List   Diagnosis Date Noted   Class 1 obesity due to excess calories with body mass index (BMI) of 31.0 to 31.9 in adult 02/19/2021   Eczema 12/11/2020   Postmenopausal atrophic vaginitis 12/11/2020   Other spondylosis, sacral and sacrococcygeal region 02/08/2020   Enthesopathy of hip region (Left) 02/08/2020   Greater trochanteric  bursitis of hips (Bilateral) 02/08/2020   Chronic sacroiliac joint pain (Right) 02/07/2020   Sacroiliac joint dysfunction (Bilateral) (R>L) 02/07/2020   Trochanteric bursitis of hip (Bilateral) 02/07/2020   Chronic hip pain (Left) 02/07/2020   Trochanteric bursitis, right hip 02/07/2020   Chronic pain syndrome 01/17/2020   Chronic lower extremity pain (2ry area of Pain) (Bilateral) (R>L) 01/17/2020   Chronic hip pain (Bilateral) (R>L) 01/17/2020   Chronic upper extremity pain (Bilateral) 01/17/2020   Cervicalgia (Right) 01/17/2020   Chronic occipital headache (Right) 01/17/2020   Cervicogenic headache (Right) 01/17/2020   Occipital neuralgia (lesser occipital nerve) (Right) 01/17/2020   Cervical facet syndrome (Right) 01/17/2020   Cervico-occipital neuralgia (Right) 01/17/2020   Chronic sacroiliac joint pain (Bilateral) (R>L) 01/17/2020   Lumbar facet syndrome (Bilateral) (R>L) 01/17/2020   HLD (hyperlipidemia) 03/04/2019   Anxiety and depression 05/20/2016   GERD (gastroesophageal reflux disease) 06/15/2015   Chronic low back pain (1ry area of Pain) (Bilateral) (R>L) w/o sciatica 06/02/2015   Insomnia 05/22/2015   Fibromyalgia 04/21/2015   Migraines 04/21/2015   Acquired hypothyroidism 12/19/2014     Durwin Reges DPT Claiborne Billings O'Daniel, SPT Durwin Reges, PT 09/12/2021, 4:23 PM  [facility] [address] [place], [address] Phone: [phone]   Fax:  [phone]  Name: Grace Shepard MRN: [id number] Date of Birth: 03/31/1966

## 2021-09-17 ENCOUNTER — Ambulatory Visit: Admitting: Physical Therapy

## 2021-09-17 ENCOUNTER — Encounter: Payer: Self-pay | Admitting: Physical Therapy

## 2021-09-17 ENCOUNTER — Other Ambulatory Visit: Payer: Self-pay

## 2021-09-17 DIAGNOSIS — M545 Low back pain, unspecified: Secondary | ICD-10-CM | POA: Diagnosis not present

## 2021-09-17 DIAGNOSIS — G8929 Other chronic pain: Secondary | ICD-10-CM

## 2021-09-17 NOTE — Therapy (Signed)
[name] [address], [place], [address] Phone: [phone]   Fax:  [phone]  Physical Therapy Treatment  Patient Details  Name: Grace Shepard MRN: [id number] Date of Birth: 26-Mar-1966 Referring Provider (PT): Dossie Arbour MD   Encounter Date: 09/17/2021   PT End of Session - 09/17/21 1122     Visit Number 6    Number of Visits 17    Date for PT Re-Evaluation 10/25/21    Authorization - Visit Number 6    Authorization - Number of Visits 17    Progress Note Due on Visit 10    PT Start Time 0945    PT Stop Time 1030    PT Time Calculation (min) 45 min    Activity Tolerance Patient tolerated treatment well    Behavior During Therapy [facility] for tasks assessed/performed             Past Medical History:  Diagnosis Date   Allergic rhinitis    Arthritis    Depression    Fibromyalgia    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   Hyperlipidemia    Ischemic colitis ([place])    Migraine    Thyroid disease    UTI (lower urinary tract infection)     Past Surgical History:  Procedure Laterality Date   TUBAL LIGATION      There were no vitals filed for this visit.   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 She stopped in September and recieved injections in her bursa bilaterlaly. Pain described as dull and centralized to lower back (points across belt line). 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 relieved by stopping mvmt, applying heat, and taking ibuphropen as needed. pain goes up to 7/10, currently 1/10, can get down to 0/10. pain aggrevated by bending over and lifting things during household and farm chores.Her home has 7 stairs to deck outside  and these do not bother her. Pnt has had 2 falls within the last 6 months due to tripping and balance deficiets. She reports history of dizziness and a 10lb decrease in weight in December. PT encouraged patient to continue to monitor weight flunctuation. No B/B issues, fever, night sweats, unrelently pain that wakes her from sleep, and pnt denies and numbness and tingling.    Limitations House hold activities;Standing;Walking;Lifting    How long can you sit comfortably? unlimted    How long can you stand comfortably? less than 5 min    How long can you walk comfortably? 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  Lumbopelvic distraction 3x 30 seconds  Theur ex Nu Step for 5 min to increase lumbosacral mobility  LTR x12  Supine marching 3 x 10- good carry over from last session Double knee to chest stretch x12 Squatting 2 x 6 with 19.3# weight - good carry over from last session Seated ball roll out to right and left to stretch bilateral lumbar extensors                     PT Education - 09/17/21 1122     Education provided Yes    Education Details therex form, lifting biomechanics    Person(s) Educated Patient    Methods Explanation;Demonstration;Verbal cues    Comprehension Verbalized understanding;Returned demonstration              PT Short Term Goals - 08/30/21 0756       PT SHORT TERM GOAL #1   Title pnt will be able to ambulate for more than 5 min with les than 2/10 NPS pain in order  to complete homesteading chores    Baseline 08/29/21 less than 5 min    Time 4    Period Weeks    Target Date 09/27/21      PT SHORT TERM GOAL #2   Title pnt will be able to bend over with less than 3/10 pain on NPS to complete ADLs    Baseline 08/29/21 4/10    Time 4    Period Weeks    Target Date 09/27/21      PT SHORT TERM GOAL #3   Title pnt will be able to turn over in bed with less than 4/10 NPS pain in order to complete functional transfers    Baseline 08/29/21 5/10 NPS;    Time 4    Period Weeks    Target Date 09/27/21               PT Long Term Goals - 08/30/21 0802       PT LONG TERM GOAL #1   Title pnt will have decrease in back pain to 5/10 or less at worst to promote ability to tolerate work and household chores    Baseline 08/29/21 7/10 NPS;    Time 8    Period Weeks    Target Date 10/25/21      PT LONG TERM GOAL #2   Title pnt will improve bilateral hip abductor MMT grade by 1/2 to improve gait and funcitonal activites    Baseline 08/29/21 4-/5    Time 8    Period Weeks    Target Date 10/25/21      PT LONG TERM GOAL #3   Title pnt will be improve bilateral glute strength by 1 MMT grade to improve function and ability to perform functional activites    Baseline 08/29/21 3/5 MMT    Time 8    Period Weeks    Target Date 10/25/21                   Plan - 09/17/21 1123     Clinical Impression Statement pnt presented with no pain today. spt perfromed mannual therapy and progressed therex to increase functional LE strength for pnt's farm chores. pnt tolerated treatment well with min cueing and no increase in pain. pnt maintained excellent motivation throughout session. PT will progress as able    Personal Factors and Comorbidities Comorbidity 3+;Time since onset of injury/illness/exacerbation;Fitness;Past/Current Experience    Comorbidities HLD, arthritis, fibromyalgia, chronic LBP    Examination-Activity Limitations  Bend;Transfers;Carry;Lift     Examination-Participation Restrictions Occupation;Cleaning;Yard Work    [employer] officer Evolving/Moderate complexity    Clinical Decision Making Moderate    Rehab Potential Good    Clinical Impairments Affecting Rehab Potential Chronicity of condition    PT Frequency 2x / week    PT Duration 4 weeks    PT Treatment/Interventions Electrical Stimulation;Moist Heat;Stair training;Functional mobility training;Therapeutic activities;Therapeutic exercise;Balance training;Neuromuscular re-education;Cognitive remediation;Patient/family education;Manual techniques;Passive range of motion;Dry needling;Energy conservation;Spinal Manipulations;Joint Manipulations;Cryotherapy;ADLs/Self Care Home Management;Iontophoresis 4mg /ml Dexamethasone;Ultrasound;Gait training    PT Next Visit Plan functional TA strengthening    PT Home Exercise Plan standing hip abduction    Consulted and Agree with Plan of Care Patient             Patient will benefit from skilled therapeutic intervention in order to improve the following deficits and impairments:  Hypomobility, Increased fascial restricitons, Decreased strength, Pain, Abnormal gait, Decreased activity tolerance, Decreased balance, Decreased mobility, Decreased range of motion, Difficulty walking, Impaired flexibility, Impaired tone, Postural dysfunction, Improper body mechanics  Visit Diagnosis: Chronic midline low back pain without sciatica     Problem List Patient Active Problem List   Diagnosis Date Noted   Class 1 obesity due to excess calories with body mass index (BMI) of 31.0 to 31.9 in adult 02/19/2021   Eczema 12/11/2020   Postmenopausal atrophic vaginitis 12/11/2020   Other spondylosis, sacral and sacrococcygeal region 02/08/2020   Enthesopathy of hip region (Left) 02/08/2020   Greater trochanteric bursitis of hips (Bilateral) 02/08/2020   Chronic sacroiliac joint pain (Right) 02/07/2020   Sacroiliac joint dysfunction  (Bilateral) (R>L) 02/07/2020   Trochanteric bursitis of hip (Bilateral) 02/07/2020   Chronic hip pain (Left) 02/07/2020   Trochanteric bursitis, right hip 02/07/2020   Chronic pain syndrome 01/17/2020   Chronic lower extremity pain (2ry area of Pain) (Bilateral) (R>L) 01/17/2020   Chronic hip pain (Bilateral) (R>L) 01/17/2020   Chronic upper extremity pain (Bilateral) 01/17/2020   Cervicalgia (Right) 01/17/2020   Chronic occipital headache (Right) 01/17/2020   Cervicogenic headache (Right) 01/17/2020   Occipital neuralgia (lesser occipital nerve) (Right) 01/17/2020   Cervical facet syndrome (Right) 01/17/2020   Cervico-occipital neuralgia (Right) 01/17/2020   Chronic sacroiliac joint pain (Bilateral) (R>L) 01/17/2020   Lumbar facet syndrome (Bilateral) (R>L) 01/17/2020   HLD (hyperlipidemia) 03/04/2019   Anxiety and depression 05/20/2016   GERD (gastroesophageal reflux disease) 06/15/2015   Chronic low back pain (1ry area of Pain) (Bilateral) (R>L) w/o sciatica 06/02/2015   Insomnia 05/22/2015   Fibromyalgia 04/21/2015   Migraines 04/21/2015   Acquired hypothyroidism 12/19/2014   Durwin Reges DPT Durwin Reges, PT 09/17/2021, 12:04 PM  [facility] [address], [place], [address] Phone: [phone]   Fax:  [phone]  Name: Grace Shepard MRN: [id number] Date of Birth: 1966-07-02

## 2021-09-20 ENCOUNTER — Encounter: Admitting: Physical Therapy

## 2021-09-24 ENCOUNTER — Encounter: Payer: Self-pay | Admitting: Internal Medicine

## 2021-09-24 ENCOUNTER — Ambulatory Visit (INDEPENDENT_AMBULATORY_CARE_PROVIDER_SITE_OTHER): Admitting: Internal Medicine

## 2021-09-24 ENCOUNTER — Other Ambulatory Visit: Payer: Self-pay

## 2021-09-24 ENCOUNTER — Ambulatory Visit: Admitting: Physical Therapy

## 2021-09-24 DIAGNOSIS — F32A Depression, unspecified: Secondary | ICD-10-CM

## 2021-09-24 DIAGNOSIS — F419 Anxiety disorder, unspecified: Secondary | ICD-10-CM

## 2021-09-24 DIAGNOSIS — E6609 Other obesity due to excess calories: Secondary | ICD-10-CM

## 2021-09-24 DIAGNOSIS — E7800 Pure hypercholesterolemia, unspecified: Secondary | ICD-10-CM

## 2021-09-24 DIAGNOSIS — E66811 Obesity, class 1: Secondary | ICD-10-CM

## 2021-09-24 DIAGNOSIS — Z6830 Body mass index (BMI) 30.0-30.9, adult: Secondary | ICD-10-CM

## 2021-09-24 DIAGNOSIS — L900 Lichen sclerosus et atrophicus: Secondary | ICD-10-CM

## 2021-09-24 MED ORDER — CLOBETASOL PROPIONATE 0.05 % EX CREA: 1.0000 "application " | TOPICAL_CREAM | Freq: Two times a day (BID) | CUTANEOUS | 0 refills | Status: DC

## 2021-09-24 NOTE — Assessment & Plan Note (Signed)
Rx for Clobetasol 0.5% cream twice daily.

## 2021-09-24 NOTE — Assessment & Plan Note (Signed)
Encourage diet and exercise for weight loss 

## 2021-09-24 NOTE — Assessment & Plan Note (Signed)
She declines repeating her lipids today She will continue Aged Garlic in place of statin and will check lipid panel at her next visit Encouraged her to consume a low-fat diet

## 2021-09-24 NOTE — Patient Instructions (Signed)
Heart-Healthy Eating Plan Heart-healthy meal planning includes: Eating less unhealthy fats. Eating more healthy fats. Making other changes in your diet. Talk with your doctor or a diet specialist (dietitian) to create an eating plan that is right for you. What is my plan? Your doctor may recommend an eating plan that includes: Total fat: ______% or less of total calories a day. Saturated fat: ______% or less of total calories a day. Cholesterol: less than _________mg a day. What are tips for following this plan? Cooking Avoid frying your food. Try to bake, boil, grill, or broil it instead. You can also reduce fat by: Removing the skin from poultry. Removing all visible fats from meats. Steaming vegetables in water or broth. Meal planning  At meals, divide your plate into four equal parts: Fill one-half of your plate with vegetables and green salads. Fill one-fourth of your plate with whole grains. Fill one-fourth of your plate with lean protein foods. Eat 4-5 servings of vegetables per day. A serving of vegetables is: 1 cup of raw or cooked vegetables. 2 cups of raw leafy greens. Eat 4-5 servings of fruit per day. A serving of fruit is: 1 medium whole fruit.  cup of dried fruit.  cup of fresh, frozen, or canned fruit.  cup of 100% fruit juice. Eat more foods that have soluble fiber. These are apples, broccoli, carrots, beans, peas, and barley. Try to get 20-30 g of fiber per day. Eat 4-5 servings of nuts, legumes, and seeds per week: 1 serving of dried beans or legumes equals  cup after being cooked. 1 serving of nuts is  cup. 1 serving of seeds equals 1 tablespoon. General information Eat more home-cooked food. Eat less restaurant, buffet, and fast food. Limit or avoid alcohol. Limit foods that are high in starch and sugar. Avoid fried foods. Lose weight if you are overweight. Keep track of how much salt (sodium) you eat. This is important if you have high blood  pressure. Ask your doctor to tell you more about this. Try to add vegetarian meals each week. Fats Choose healthy fats. These include olive oil and canola oil, flaxseeds, walnuts, almonds, and seeds. Eat more omega-3 fats. These include salmon, mackerel, sardines, tuna, flaxseed oil, and ground flaxseeds. Try to eat fish at least 2 times each week. Check food labels. Avoid foods with trans fats or high amounts of saturated fat. Limit saturated fats. These are often found in animal products, such as meats, butter, and cream. These are also found in plant foods, such as palm oil, palm kernel oil, and coconut oil. Avoid foods with partially hydrogenated oils in them. These have trans fats. Examples are stick margarine, some tub margarines, cookies, crackers, and other baked goods. What foods can I eat? Fruits All fresh, canned (in natural juice), or frozen fruits. Vegetables Fresh or frozen vegetables (raw, steamed, roasted, or grilled). Green salads. Grains Most grains. Choose whole wheat and whole grains most of the time. Rice and pasta, including brown rice and pastas made with whole wheat. Meats and other proteins Lean, well-trimmed beef, veal, pork, and lamb. Chicken and turkey without skin. All fish and shellfish. Wild duck, rabbit, pheasant, and venison. Egg whites or low-cholesterol egg substitutes. Dried beans, peas, lentils, and tofu. Seeds and most nuts. Dairy Low-fat or nonfat cheeses, including ricotta and mozzarella. Skim or 1% milk that is liquid, powdered, or evaporated. Buttermilk that is made with low-fat milk. Nonfat or low-fat yogurt. Fats and oils Non-hydrogenated (trans-free) margarines. Vegetable oils, including   soybean, sesame, sunflower, olive, peanut, safflower, corn, canola, and cottonseed. Salad dressings or mayonnaise made with a vegetable oil. Beverages Mineral water. Coffee and tea. Diet carbonated beverages. Sweets and desserts Sherbet, gelatin, and fruit ice.  Small amounts of dark chocolate. Limit all sweets and desserts. Seasonings and condiments All seasonings and condiments. The items listed above may not be a complete list of foods and drinks you can eat. Contact a dietitian for more options. What foods should I avoid? Fruits Canned fruit in heavy syrup. Fruit in cream or butter sauce. Fried fruit. Limit coconut. Vegetables Vegetables cooked in cheese, cream, or butter sauce. Fried vegetables. Grains Breads that are made with saturated or trans fats, oils, or whole milk. Croissants. Sweet rolls. Donuts. High-fat crackers, such as cheese crackers. Meats and other proteins Fatty meats, such as hot dogs, ribs, sausage, bacon, rib-eye roast or steak. High-fat deli meats, such as salami and bologna. Caviar. Domestic duck and goose. Organ meats, such as liver. Dairy Cream, sour cream, cream cheese, and creamed cottage cheese. Whole-milk cheeses. Whole or 2% milk that is liquid, evaporated, or condensed. Whole buttermilk. Cream sauce or high-fat cheese sauce. Yogurt that is made from whole milk. Fats and oils Meat fat, or shortening. Cocoa butter, hydrogenated oils, palm oil, coconut oil, palm kernel oil. Solid fats and shortenings, including bacon fat, salt pork, lard, and butter. Nondairy cream substitutes. Salad dressings with cheese or sour cream. Beverages Regular sodas and juice drinks with added sugar. Sweets and desserts Frosting. Pudding. Cookies. Cakes. Pies. Milk chocolate or white chocolate. Buttered syrups. Full-fat ice cream or ice cream drinks. The items listed above may not be a complete list of foods and drinks to avoid. Contact a dietitian for more information. Summary Heart-healthy meal planning includes eating less unhealthy fats, eating more healthy fats, and making other changes in your diet. Eat a balanced diet. This includes fruits and vegetables, low-fat or nonfat dairy, lean protein, nuts and legumes, whole grains, and  heart-healthy oils and fats. This information is not intended to replace advice given to you by your health care provider. Make sure you discuss any questions you have with your health care provider. Document Revised: 12/07/2020 Document Reviewed: 12/07/2020 Elsevier Patient Education  2022 [employer]  

## 2021-09-24 NOTE — Progress Notes (Signed)
Subjective:    Patient ID: Grace Shepard, female    DOB: 1966-08-12, 56 y.o.   MRN: [id number]  HPI  Pt presents to the clinic today for follow up of anxiety and depression. She reports she is currently taking 1/2 10 mg of Paroxetine. She is still taking the Wellbutrin as prescribed. She reports her mood is much better. She would eventually like to discuss weaning the Wellbutrin as well.   She reports she is No longer taking her statin. She just didn't want to take it anymore. She is taking Aged Garlic supplement in its place. She has been trying to consumes a low fat diet.  She also reports a itchy area on her bottom. She noticed this 3 weeks ago. She does feel like the itching has gotten more intense. She has not tried any thing OTC for this. She reports she has been diagnosed with Lichen Sclerosus in the past.  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   Hyperlipidemia    Ischemic colitis ([place])    Migraine    Thyroid disease    UTI (lower urinary tract infection)     Current Outpatient Medications  Medication Sig Dispense Refill   albuterol (VENTOLIN HFA) 108 (90 Base) MCG/ACT inhaler Inhale 1 puff into the lungs every 4 (four) hours as needed for wheezing or shortness of breath. 18 g 2   baclofen (LIORESAL) 10 MG tablet Take 10 mg by mouth as needed for muscle spasms.     buPROPion (WELLBUTRIN XL) 300 MG 24 hr tablet TAKE 1 TABLET DAILY 90 tablet 0   cetirizine (ZYRTEC) 10 MG tablet Take 10 mg by mouth daily.     Cholecalciferol (VITAMIN D3) 125 MCG (5000 UT) CAPS Take 4,000 Units by mouth.     estradiol (ESTRACE) 0.1 MG/GM vaginal cream Place 1 Applicatorful vaginally 3 (three) times a week. 42.5 g 2   ipratropium (ATROVENT) 0.06 % nasal spray Place 2 sprays into both nostrils 4 (four) times daily. For up to 5-7 days then stop.  15 mL 0   levothyroxine (SYNTHROID) 75 MCG tablet TAKE 1 TABLET DAILY 90 tablet 3   montelukast (SINGULAIR) 10 MG tablet TAKE 1 TABLET AT BEDTIME 90 tablet 3   pantoprazole (PROTONIX) 40 MG tablet Take 1 tablet (40 mg total) by mouth daily. 90 tablet 2   PARoxetine (PAXIL) 10 MG tablet TAKE 1 TABLET BY MOUTH EVERY DAY 90 tablet 1   pregabalin (LYRICA) 75 MG capsule Take 1 capsule (75 mg total) by mouth 2 (two) times daily. 180 capsule 0   rosuvastatin (CRESTOR) 10 MG tablet TAKE 1 TABLET DAILY 90 tablet 3   topiramate (TOPAMAX) 100 MG tablet Take 100 mg by mouth daily.     traZODone (DESYREL) 50 MG tablet TAKE 1 TO 2 TABLETS AT BEDTIME AS NEEDED FOR SLEEP 180 tablet 0   triamcinolone cream (KENALOG) 0.1 % APPLY 1 APPLICATION TOPICALLY TWICE A DAY AS NEEDED. AVOID FACE, GROIN, AXILLA 60 g 11   vitamin B-12 (CYANOCOBALAMIN) 1000 MCG tablet Take 50 mcg by mouth every other day.      No current facility-administered medications for this visit.    Allergies  Allergen Reactions   Bactrim [Sulfamethoxazole-Trimethoprim] Hives   Gabapentin Other (See Comments)   Tramadol Itching  Family History  Problem Relation Age of Onset   Drug abuse Sister    Alcohol abuse Sister    Depression Sister    Rheum arthritis Father    Congestive Heart Failure Father    Depression Father    Diabetes Father    Hearing loss Father    Osteoarthritis Mother    Stroke Mother    Aneurysm Mother    Breast cancer Maternal Aunt    Heart attack Maternal Grandfather    Diabetes Brother    Alcohol abuse Paternal Uncle    Arthritis Sister    Depression Sister    Heart disease Sister    Crohn's disease Sister     Social History   Socioeconomic History   Marital status: Married    Spouse name: Not on file   Number of children: Not on file   Years of education: Not on file   Highest education level: Not on file  Occupational History   Not on file  Tobacco Use   Smoking status: Never   Smokeless  tobacco: Never  Vaping Use   Vaping Use: Never used  Substance and Sexual Activity   Alcohol use: No   Drug use: No   Sexual activity: Yes    Comment: Perimenopausal  Other Topics Concern   Not on file  Social History Narrative   Not on file   Social Determinants of Health   Financial Resource Strain: Not on file  Food Insecurity: Not on file  Transportation Needs: Not on file  Physical Activity: Not on file  Stress: Not on file  Social Connections: Not on file  Intimate Partner Violence: Not on file     Constitutional: Denies fever, malaise, fatigue, headache or abrupt weight changes.  Respiratory: Denies difficulty breathing, shortness of breath, cough or sputum production.   Cardiovascular: Denies chest pain, chest tightness, palpitations or swelling in the hands or feet.  Gastrointestinal: Denies abdominal pain, bloating, constipation, diarrhea or blood in the stool.  Skin: Pt reports perianal itching. Denies redness, rashes, lesions or ulcercations.  Neurological: Denies dizziness, difficulty with memory, difficulty with speech or problems with balance and coordination.  Psych: Pt has a history of anxiety and depression. Denies SI/HI.  No other specific complaints in a complete review of systems (except as listed in HPI above).  Objective:   Physical Exam BP 101/66 (BP Location: Left Arm, Patient Position: Sitting, Cuff Size: Normal)    Pulse 87    Resp 15    Ht 4\' 10"  (1.473 m)    Wt 145 lb 9.6 oz (66 kg)    LMP 05/08/2015 Comment: neg preg   SpO2 97%    BMI 30.43 kg/m   Wt Readings from Last 3 Encounters:  06/28/21 150 lb (68 kg)  05/24/21 151 lb 3.2 oz (68.6 kg)  05/15/21 150 lb (68 kg)    General: Appears her stated age, obese, in NAD. Skin: Warm, dry and intact. No rashes, lesions or ulcerations noted around the perianal area. Cardiovascular: Normal rate. Pulmonary/Chest: Normal effort. Musculoskeletal:  No difficulty with gait.  Neurological: Alert and  oriented.  Psychiatric: Mood and affect normal. Behavior is normal. Judgment and thought content normal.    BMET    Component Value Date/Time   NA 142 05/24/2021 1434   NA 139 05/29/2014 0516   K 3.6 05/24/2021 1434   K 3.6 05/29/2014 0516   CL 110 05/24/2021 1434   CL 104 05/29/2014 0516   CO2 26 05/24/2021  1434   CO2 24 05/29/2014 0516   GLUCOSE 73 05/24/2021 1434   GLUCOSE 130 (H) 05/29/2014 0516   BUN 13 05/24/2021 1434   BUN 7 05/29/2014 0516   CREATININE 0.88 05/24/2021 1434   CALCIUM 9.1 05/24/2021 1434   CALCIUM 8.6 05/29/2014 0516   GFRNONAA 58 (L) 01/17/2020 1346   GFRNONAA >60 05/29/2014 0516   GFRNONAA >60 06/28/2013 0511   GFRAA >60 01/17/2020 1346   GFRAA >60 05/29/2014 0516   GFRAA >60 06/28/2013 0511    Lipid Panel     Component Value Date/Time   CHOL 124 05/24/2021 1434   TRIG 212 (H) 05/24/2021 1434   HDL 47 (L) 05/24/2021 1434   CHOLHDL 2.6 05/24/2021 1434   VLDL 21.4 04/11/2020 1205   LDLCALC 49 05/24/2021 1434    CBC    Component Value Date/Time   WBC 10.4 05/24/2021 1434   RBC 4.50 05/24/2021 1434   HGB 13.0 05/24/2021 1434   HGB 13.9 05/29/2014 0516   HCT 40.2 05/24/2021 1434   HCT 41.3 05/29/2014 0516   PLT 260 05/24/2021 1434   PLT 227 05/29/2014 0516   MCV 89.3 05/24/2021 1434   MCV 89 05/29/2014 0516   MCH 28.9 05/24/2021 1434   MCHC 32.3 05/24/2021 1434   RDW 12.5 05/24/2021 1434   RDW 13.2 05/29/2014 0516   LYMPHSABS 1.9 12/30/2014 1425   LYMPHSABS 1.8 05/29/2014 0516   MONOABS 0.7 12/30/2014 1425   MONOABS 1.1 (H) 05/29/2014 0516   EOSABS 0.2 12/30/2014 1425   EOSABS 0.2 05/29/2014 0516   BASOSABS 0.1 12/30/2014 1425   BASOSABS 0.1 05/29/2014 0516    Hgb A1C Lab Results  Component Value Date   HGBA1C 5.1 05/24/2021           Assessment & Plan:    [name], NP This visit occurred during the SARS-CoV-2 public health emergency.  Safety protocols were in place, including screening questions prior to the  visit, additional usage of staff PPE, and extensive cleaning of exam room while observing appropriate contact time as indicated for disinfecting solutions.

## 2021-09-24 NOTE — Assessment & Plan Note (Signed)
We will have her take Paroxetine every other day x1 week and then stop Continue Wellbutrin at current dose and will discuss weaning this at her next visit Support offered

## 2021-09-26 ENCOUNTER — Other Ambulatory Visit: Payer: Self-pay

## 2021-09-26 ENCOUNTER — Ambulatory Visit: Admitting: Physical Therapy

## 2021-09-26 ENCOUNTER — Encounter: Payer: Self-pay | Admitting: Physical Therapy

## 2021-09-26 DIAGNOSIS — M545 Low back pain, unspecified: Secondary | ICD-10-CM | POA: Diagnosis not present

## 2021-09-26 DIAGNOSIS — G8929 Other chronic pain: Secondary | ICD-10-CM

## 2021-09-26 NOTE — Therapy (Signed)
[facility] [address], [place], [address] Phone: [phone]   Fax:  [phone]  Physical Therapy Treatment  Patient Details  Name: Grace Shepard MRN: [id number] Date of Birth: Jan 16, 1966 Referring Provider (PT): Dossie Arbour MD   Encounter Date: 09/26/2021   PT End of Session - 09/26/21 1621     Visit Number 7    Number of Visits 17    Date for PT Re-Evaluation 10/25/21    Authorization - Visit Number 6    Authorization - Number of Visits 17    Progress Note Due on Visit 10    PT Start Time 1520    PT Stop Time 1605    PT Time Calculation (min) 45 min    Activity Tolerance Patient tolerated treatment well    Behavior During Therapy [facility] for tasks assessed/performed             Past Medical History:  Diagnosis Date   Allergic rhinitis    Arthritis    Depression    Fibromyalgia    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   Hyperlipidemia    Ischemic colitis ([place])    Migraine    Thyroid disease    UTI (lower urinary tract infection)     Past Surgical History:  Procedure Laterality Date   TUBAL LIGATION      There were no vitals filed for this visit.   Subjective Assessment - 09/26/21 1522     Subjective Patient reports increase in pain, 4/10, that may have been aggrevated by walking over the weekend as well as increased lifting demand as she prepares for an upcoming meeting tomorrow. She also states that her stress levels have elevated due to this meeting. Pain has been unrelenting.    Pertinent History pnt referred for bursitis that came on after participating in exercise classes. She stopped in September and recieved injections in her bursa bilaterlaly. Pain described as dull and centralized to lower back (points across belt line). 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 relieved by stopping mvmt, applying heat, and taking  ibuphropen as needed. pain goes up to 7/10, currently 1/10, can get down to 0/10. pain aggrevated by bending over and lifting things during household and farm chores.Her home has 7 stairs to deck outside and these do not bother her. Pnt has had 2 falls within the last 6 months due to tripping and balance deficiets. She reports history of dizziness and a 10lb decrease in weight in December. PT encouraged patient to continue to monitor weight flunctuation. No B/B issues, fever, night sweats, unrelently pain that wakes her from sleep, and pnt denies and numbness and tingling.    Limitations House hold activities;Standing;Walking;Lifting    How long can you sit comfortably? unlimted    How long can you stand comfortably? less than 5 min    How long can you walk comfortably? 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 Yes    Pain Score 4     Pain Location Back  Pain Orientation Lower    Pain Descriptors / Indicators Aching;Discomfort;Dull;Guarding    Pain Onset More than a month ago                **LOW BACK**   INTERVENTIONS  Manual Therapy STM utilizing efflurage, pettrasage and deep pressure to bilateral lumbar extensors and gluteus medius following TDN.    Trigger Point Dry Needling (TDN), unbilled. Education performed with patient regarding potential benefit of TDN. Reviewed precautions and risks with patient. Pt provided verbal consent to treatment. TDN performed to with 0.30 x 60 single needle placements with local twitch response (LTR). Pistoning technique utilized. Improved pain-free motion following intervention. Muscles targeted: bilateral lumbar  paraspinals and right glute med   Thoracolumbar rotation stretch in hooklying 2 x 30 seconds each side Single knee to chest stretch x 60 seconds each side Hamstring stretch x60 seconds each side Piriformis stretch x60 seconds each side      Therapeutic Exercise NuStep, level 1 for 4 min to increase lumbosacral mobility - decreased tolerance this session limiting time Supine marching with TA activation x 15 each side   Added to HEP - stretches listed above with brief demonstration by patient to perform independently at home.      Clinical Impression: Patient arrived with increased pain and sensitivity this date; pain limited ability to complete NuStep warmup. Pt was educated and agreeable to TDN to alleviate pain in low back and glutes in order to progress session to gentle core stabilization and strengthening. Pt had good response to dry needling and stretching with overall decrease in pain before she left the session. PT added stretches to HEP and informed pt of plan to progress session to increased strengthening and functional activity next session as pain allows. 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            PT Short Term Goals - 08/30/21 0756       PT SHORT TERM GOAL #1   Title pnt will be able to ambulate for more than 5 min with les than 2/10 NPS pain in order to complete homesteading chores    Baseline 08/29/21 less than 5 min    Time 4    Period Weeks    Target Date 09/27/21      PT SHORT TERM GOAL #2   Title pnt will be able to bend over with less than 3/10 pain on NPS to complete ADLs    Baseline 08/29/21 4/10    Time 4    Period Weeks    Target Date 09/27/21      PT SHORT TERM GOAL #3   Title pnt will be able to turn over in bed with less than 4/10 NPS pain in order to complete functional transfers    Baseline 08/29/21 5/10 NPS;    Time 4     Period Weeks    Target Date 09/27/21               PT Long Term Goals - 08/30/21 0802       PT LONG TERM GOAL #1   Title pnt will have decrease in back pain to 5/10 or less at worst to promote ability to tolerate work and household chores    Baseline 08/29/21 7/10 NPS;    Time 8    Period Weeks    Target Date 10/25/21  PT LONG TERM GOAL #2   Title pnt will improve bilateral hip abductor MMT grade by 1/2 to improve gait and funcitonal activites    Baseline 08/29/21 4-/5    Time 8    Period Weeks    Target Date 10/25/21      PT LONG TERM GOAL #3   Title pnt will be improve bilateral glute strength by 1 MMT grade to improve function and ability to perform functional activites    Baseline 08/29/21 3/5 MMT    Time 8    Period Weeks    Target Date 10/25/21                   Plan - 09/26/21 1622     Clinical Impression Statement Patient arrived with increased pain and sensitivity this date; pain limited ability to complete NuStep warmup. Pt was educated and agreeable to TDN to alleviate pain in low back and glutes in order to progress session to gentle core stabilization and strengthening. Pt had good response to dry needling and stretching with overall decrease in pain before she left the session. PT added stretches to HEP and informed pt of plan to progress session to increased strengthening and functional activity next session as pain allows. 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    Personal Factors and Comorbidities Comorbidity 3+;Time since onset of injury/illness/exacerbation;Fitness;Past/Current Experience    Comorbidities HLD, arthritis, fibromyalgia, chronic LBP    Examination-Activity Limitations Bend;Transfers;Carry;Lift    Examination-Participation Restrictions Occupation;Cleaning;Yard Work    [employer] Evolving/Moderate  complexity    Rehab Potential Good    Clinical Impairments Affecting Rehab Potential Chronicity of condition    PT Frequency 2x / week    PT Duration 4 weeks    PT Treatment/Interventions Electrical Stimulation;Moist Heat;Stair training;Functional mobility training;Therapeutic activities;Therapeutic exercise;Balance training;Neuromuscular re-education;Cognitive remediation;Patient/family education;Manual techniques;Passive range of motion;Dry needling;Energy conservation;Spinal Manipulations;Joint Manipulations;Cryotherapy;ADLs/Self Care Home Management;Iontophoresis 4mg /ml Dexamethasone;Ultrasound;Gait training    PT Next Visit Plan functional TA strengthening    PT Home Exercise Plan standing hip abduction    Consulted and Agree with Plan of Care Patient             Patient will benefit from skilled therapeutic intervention in order to improve the following deficits and impairments:  Hypomobility, Increased fascial restricitons, Decreased strength, Pain, Abnormal gait, Decreased activity tolerance, Decreased balance, Decreased mobility, Decreased range of motion, Difficulty walking, Impaired flexibility, Impaired tone, Postural dysfunction, Improper body mechanics  Visit Diagnosis: Chronic midline low back pain without sciatica     Problem List Patient Active Problem List   Diagnosis Date Noted   Lichen sclerosus 01/74/9449   Class 1 obesity due to excess calories with body mass index (BMI) of 30.0 to 30.9 in adult 02/19/2021   Eczema 12/11/2020   Postmenopausal atrophic vaginitis 12/11/2020   Other spondylosis, sacral and sacrococcygeal region 02/08/2020   Enthesopathy of hip region (Left) 02/08/2020   Greater trochanteric bursitis of hips (Bilateral) 02/08/2020   Chronic sacroiliac joint pain (Right) 02/07/2020   Sacroiliac joint dysfunction (Bilateral) (R>L) 02/07/2020   Trochanteric bursitis of hip (Bilateral) 02/07/2020   Chronic hip pain (Left) 02/07/2020   Trochanteric  bursitis, right hip 02/07/2020   Chronic pain syndrome 01/17/2020   Chronic lower extremity pain (2ry area of Pain) (Bilateral) (R>L) 01/17/2020   Chronic hip pain (Bilateral) (R>L) 01/17/2020   Chronic upper extremity pain (  Bilateral) 01/17/2020   Cervicalgia (Right) 01/17/2020   Chronic occipital headache (Right) 01/17/2020   Cervicogenic headache (Right) 01/17/2020   Occipital neuralgia (lesser occipital nerve) (Right) 01/17/2020   Cervical facet syndrome (Right) 01/17/2020   Cervico-occipital neuralgia (Right) 01/17/2020   Chronic sacroiliac joint pain (Bilateral) (R>L) 01/17/2020   Lumbar facet syndrome (Bilateral) (R>L) 01/17/2020   HLD (hyperlipidemia) 03/04/2019   Anxiety and depression 05/20/2016   GERD (gastroesophageal reflux disease) 06/15/2015   Chronic low back pain (1ry area of Pain) (Bilateral) (R>L) w/o sciatica 06/02/2015   Insomnia 05/22/2015   Fibromyalgia 04/21/2015   Migraines 04/21/2015   Acquired hypothyroidism 12/19/2014    Grace Shepard PT, DPT   [facility] [address], [place], [address] Phone: [phone]   Fax:  [phone]  Name: Grace Shepard MRN: [id number] Date of Birth: Jan 04, 1966

## 2021-09-27 ENCOUNTER — Ambulatory Visit: Admitting: Physical Therapy

## 2021-10-24 ENCOUNTER — Other Ambulatory Visit: Payer: Self-pay | Admitting: Internal Medicine

## 2021-10-24 NOTE — Telephone Encounter (Signed)
Requested medication (s) are due for refill today: Early ? ?Requested medication (s) are on the active medication list: Yes ? ?Last refill:  08/17/21 ? ?Future visit scheduled: Yes ? ?Notes to clinic:  Asking for 1 year supply. ? ? ? ?Requested Prescriptions  ?Pending Prescriptions Disp Refills  ? buPROPion (WELLBUTRIN XL) 300 MG 24 hr tablet [Pharmacy Med Name: BUPROPION HCL XL TABS '300MG'$ ] 90 tablet 1  ?  Sig: TAKE 1 TABLET DAILY  ?  ? Psychiatry: Antidepressants - bupropion Passed - 10/24/2021  9:51 AM  ?  ?  Passed - Cr in normal range and within 360 days  ?  Creat  ?Date Value Ref Range Status  ?05/24/2021 0.88 0.50 - 1.03 mg/dL Final  ?  ?  ?  ?  Passed - AST in normal range and within 360 days  ?  AST  ?Date Value Ref Range Status  ?05/24/2021 17 10 - 35 U/L Final  ? ?SGOT(AST)  ?Date Value Ref Range Status  ?05/29/2014 21 15 - 37 Unit/L Final  ?  ?  ?  ?  Passed - ALT in normal range and within 360 days  ?  ALT  ?Date Value Ref Range Status  ?05/24/2021 25 6 - 29 U/L Final  ? ?SGPT (ALT)  ?Date Value Ref Range Status  ?05/29/2014 21 U/L Final  ?  Comment:  ?  14-63 ?NOTE: New Reference Range ?03/01/14 ?  ?  ?  ?  ?  Passed - Completed PHQ-2 or PHQ-9 in the last 360 days  ?  ?  Passed - Last BP in normal range  ?  BP Readings from Last 1 Encounters:  ?09/24/21 101/66  ?  ?  ?  ?  Passed - Valid encounter within last 6 months  ?  Recent Outpatient Visits   ? ?      ? 1 month ago Anxiety and depression  ? [facility] [place], Coralie Keens, NP  ? 5 months ago Encounter for general adult medical examination with abnormal findings  ? [facility] [place], [place] W, NP  ? 5 months ago Acute viral syndrome  ? [place], DO  ? 7 months ago Excessive daytime sleepiness  ? [facility] [place], [place], NP  ? 8 months ago Psychophysiological insomnia  ? [facility] [place], Coralie Keens, NP  ? ?  ?  ?Future Appointments   ? ?        ?  In 2 weeks Baity, Coralie Keens, NP [facility] [place]  ? ?  ? ?  ?  ?  ? ?

## 2021-11-06 ENCOUNTER — Other Ambulatory Visit: Payer: Self-pay | Admitting: Internal Medicine

## 2021-11-07 ENCOUNTER — Ambulatory Visit: Admitting: Physician Assistant

## 2021-11-07 NOTE — Telephone Encounter (Signed)
Requested Prescriptions  ?Pending Prescriptions Disp Refills  ?? traZODone (DESYREL) 50 MG tablet [Pharmacy Med Name: TRAZODONE HCL TABS '50MG'$ ] 180 tablet 3  ?  Sig: TAKE 1 TO 2 TABLETS AT BEDTIME AS NEEDED FOR SLEEP  ?  ? Psychiatry: Antidepressants - Serotonin Modulator Passed - 11/06/2021  3:05 AM  ?  ?  Passed - Completed PHQ-2 or PHQ-9 in the last 360 days  ?  ?  Passed - Valid encounter within last 6 months  ?  Recent Outpatient Visits   ?      ? 1 month ago Anxiety and depression  ? [facility] [place], Coralie Keens, NP  ? 5 months ago Encounter for general adult medical examination with abnormal findings  ? [facility] [place], [place], NP  ? 5 months ago Acute viral syndrome  ? [place], DO  ? 8 months ago Excessive daytime sleepiness  ? [facility] [place], [place], NP  ? 8 months ago Psychophysiological insomnia  ? [facility] [place], Coralie Keens, NP  ?  ?  ? ?  ?  ?  ? ? ?

## 2021-11-23 ENCOUNTER — Other Ambulatory Visit: Payer: Self-pay | Admitting: Internal Medicine

## 2021-11-23 NOTE — Telephone Encounter (Signed)
Requested Prescriptions  ?Pending Prescriptions Disp Refills  ?? albuterol (VENTOLIN HFA) 108 (90 Base) MCG/ACT inhaler [Pharmacy Med Name: ALBUTEROL(PA) HFA INH 8.5GM 90MCG] 17 g 4  ?  Sig: USE 1 INHALATION EVERY 4 HOURS AS NEEDED FOR WHEEZING OR SHORTNESS OF BREATH  ?  ? Pulmonology:  Beta Agonists 2 Passed - 11/23/2021  3:32 AM  ?  ?  Passed - Last BP in normal range  ?  BP Readings from Last 1 Encounters:  ?09/24/21 101/66  ?   ?  ?  Passed - Last Heart Rate in normal range  ?  Pulse Readings from Last 1 Encounters:  ?09/24/21 87  ?   ?  ?  Passed - Valid encounter within last 12 months  ?  Recent Outpatient Visits   ?      ? 2 months ago Anxiety and depression  ? [facility] [place], Coralie Keens, NP  ? 6 months ago Encounter for general adult medical examination with abnormal findings  ? [facility] [place], [place], NP  ? 6 months ago Acute viral syndrome  ? [place], DO  ? 8 months ago Excessive daytime sleepiness  ? [facility] [place], Coralie Keens, NP  ? 9 months ago Psychophysiological insomnia  ? [facility] [place], Coralie Keens, NP  ?  ?  ? ?  ?  ?  ? ?

## 2021-11-26 ENCOUNTER — Other Ambulatory Visit: Payer: Self-pay | Admitting: Gastroenterology

## 2021-11-26 DIAGNOSIS — R1013 Epigastric pain: Secondary | ICD-10-CM

## 2021-11-28 ENCOUNTER — Other Ambulatory Visit: Payer: Self-pay | Admitting: Internal Medicine

## 2021-11-28 NOTE — Telephone Encounter (Signed)
Requested medication (s) are due for refill today:   Provider to review ? ?Requested medication (s) are on the active medication list:   Yes ? ?Future visit scheduled:   No ? ? ?Last ordered: 12/11/2020 #180, 0 refills ? ?Non delegated refill reason returned  ? ?Requested Prescriptions  ?Pending Prescriptions Disp Refills  ? pregabalin (LYRICA) 75 MG capsule [Pharmacy Med Name: PREGABALIN CAPS '75MG'$ ] 180 capsule 0  ?  Sig: TAKE 1 CAPSULE TWICE A DAY  ?  ? Not Delegated - Neurology:  Anticonvulsants - Controlled - pregabalin Failed - 11/28/2021  9:22 AM  ?  ?  Failed - This refill cannot be delegated  ?  ?  Passed - Cr in normal range and within 360 days  ?  Creat  ?Date Value Ref Range Status  ?05/24/2021 0.88 0.50 - 1.03 mg/dL Final  ?  ?  ?  ?  Passed - Completed PHQ-2 or PHQ-9 in the last 360 days  ?  ?  Passed - Valid encounter within last 12 months  ?  Recent Outpatient Visits   ? ?      ? 2 months ago Anxiety and depression  ? [facility] [place], Coralie Keens, NP  ? 6 months ago Encounter for general adult medical examination with abnormal findings  ? [facility] [place], [place], NP  ? 6 months ago Acute viral syndrome  ? [place], DO  ? 8 months ago Excessive daytime sleepiness  ? [facility] [place], Coralie Keens, NP  ? 9 months ago Psychophysiological insomnia  ? [facility] [place], Coralie Keens, NP  ? ?  ?  ? ? ?  ?  ?  ? ?

## 2021-11-30 ENCOUNTER — Ambulatory Visit

## 2021-12-03 ENCOUNTER — Ambulatory Visit
Admission: RE | Admit: 2021-12-03 | Discharge: 2021-12-03 | Disposition: A | Discharge status: Home / Self Care | Source: Ambulatory Visit | Attending: Gastroenterology | Admitting: Gastroenterology

## 2021-12-03 ENCOUNTER — Ambulatory Visit

## 2021-12-03 DIAGNOSIS — R1013 Epigastric pain: Secondary | ICD-10-CM | POA: Insufficient documentation

## 2021-12-05 ENCOUNTER — Other Ambulatory Visit: Payer: Self-pay | Admitting: Internal Medicine

## 2021-12-05 ENCOUNTER — Other Ambulatory Visit: Payer: Self-pay | Admitting: Family Medicine

## 2021-12-05 DIAGNOSIS — B349 Viral infection, unspecified: Secondary | ICD-10-CM

## 2021-12-06 ENCOUNTER — Telehealth: Payer: Self-pay | Admitting: Internal Medicine

## 2021-12-06 ENCOUNTER — Telehealth: Payer: Self-pay

## 2021-12-06 ENCOUNTER — Other Ambulatory Visit (HOSPITAL_COMMUNITY): Payer: Self-pay

## 2021-12-06 MED ORDER — IPRATROPIUM BROMIDE 0.06 % NA SOLN
2.0000 | Freq: Four times a day (QID) | NASAL | 0 refills | Status: DC
  Filled 2021-12-06: qty 15, 19d supply, fill #0

## 2021-12-06 MED ORDER — BACLOFEN 10 MG PO TABS
10.0000 mg | ORAL_TABLET | Freq: Three times a day (TID) | ORAL | 0 refills | Status: DC | PRN
  Filled 2021-12-06: qty 90, 30d supply, fill #0

## 2021-12-06 MED ORDER — BACLOFEN 10 MG PO TABS
10.0000 mg | ORAL_TABLET | ORAL | 0 refills | Status: DC | PRN
  Filled 2021-12-06: qty 90, 90d supply, fill #0

## 2021-12-06 NOTE — Telephone Encounter (Signed)
Up to 3 x day ?

## 2021-12-06 NOTE — Telephone Encounter (Signed)
[facility] need clarification on how many times a day pt can take baclofen.  Please resend to [facility].  ? ?Thanks,  ? ?-Mickel Baas  ?

## 2021-12-06 NOTE — Telephone Encounter (Signed)
Estill Bamberg with [facility] outpt pharmacy is calling in for direction clarification for a medication  ? ?baclofen (LIORESAL) 10 MG tablet  ? ?Please advise.  ?

## 2021-12-06 NOTE — Addendum Note (Signed)
Addended by: Ashley Royalty E on: 12/06/2021 12:17 PM ? ? Modules accepted: Orders ? ?

## 2021-12-07 ENCOUNTER — Other Ambulatory Visit (HOSPITAL_COMMUNITY): Payer: Self-pay

## 2021-12-07 NOTE — Telephone Encounter (Signed)
Resent with corrected directions.  ? ?Thanks,  ? ?-Mickel Baas  ?

## 2021-12-10 ENCOUNTER — Other Ambulatory Visit: Payer: Self-pay

## 2021-12-14 ENCOUNTER — Other Ambulatory Visit (HOSPITAL_COMMUNITY): Payer: Self-pay

## 2021-12-19 ENCOUNTER — Other Ambulatory Visit: Payer: Self-pay | Admitting: General Surgery

## 2021-12-19 NOTE — Progress Notes (Signed)
Subjective:  ?  ? Patient ID: Grace Shepard is a 56 y.o. female. ?  ?HPI ?  ?The following portions of the patient's history were reviewed and updated as appropriate. ?  ?This is a new patient is here today for: office visit. Patient has been referred by Stephens November, NP for evaluation of epigastric pain and gallstones. The patient did have an abdominal ultrasound completed on 11-26-21 at [facility]. Patient has not noticed any clear food triggers. She reports the abdominal pain is in the right upper quadrant and radiates to the right back.  The pain typically last 30 to 60 minutes.  She has no episodes of nocturnal awakening.  Sausage on 1 occasion did cause pain.  She makes use of a colon cleanse on a regular basis to have a formed stool every 3 days.  Without this she may go 4-5 days without a bowel movement.  She has a history of GERD which is usually well controlled with a twice daily PPI.  She did report that she has seen some white material in the stools on occasion. ?  ?    ?Chief Complaint  ?Patient presents with  ? Abdominal Pain  ?    epigastric  ?  ?  ?BP 102/64   Pulse 93   Temp 36.6 ?C (97.9 ?F)   Ht 147.3 cm (4' 10" )   Wt 63 kg (139 lb)   SpO2 99%   BMI 29.05 kg/m?  ?  ?    ?Past Medical History:  ?Diagnosis Date  ? Asthma without status asthmaticus    ? Colon polyp    ? Depression    ? Fibromyalgia    ? HLD (hyperlipidemia)    ? Hypothyroidism    ? Ischemic colitis (CMS-HCC) 05/30/2014  ? Migraine    ? Seasonal allergies    ?  ?  ?     ?Past Surgical History:  ?Procedure Laterality Date  ? Radioactive thyroid ablation   1991  ? EXPLORATORY LAPAROTOMY   2006  ? COLONOSCOPY   05/30/2014  ?  Ischemic Colitis: Repeat age 32 per PYO  ? EGD   10/03/2016  ?  No repeat per RTE  ? COLONOSCOPY   09/29/2018  ?  Adenomatous Polyps: CBF 09/2021  ? EGD   09/29/2018  ?  Gastritis: No repeat per RTE  ? TUBAL LIGATION      ?  ?  ?  ?        ?OB History   ?  Gravida  ?3  ? Para  ?3  ?  Term  ?   ? Preterm  ?   ? AB  ?   ? Living  ?   ?  ?  SAB  ?   ? IAB  ?   ? Ectopic  ?   ? Molar  ?   ? Multiple  ?   ? Live Births  ?   ?  ?  ?  Obstetric Comments  ?Age at first period 38 ?Age of first pregnancy 66 ?   ?  ?   ?  ?  ?Social History  ?  ?  ?     ?Socioeconomic History  ? Marital status: Married  ?Tobacco Use  ? Smoking status: Never  ? Smokeless tobacco: Never  ?Substance and Sexual Activity  ? Alcohol use: No  ?    Alcohol/week: 0.0 standard drinks  ? Drug use: No  ? Sexual activity:  Defer  ?    Partners: Male  ?  ?  ?  ?    ?Allergies  ?Allergen Reactions  ? Bactrim [Sulfamethoxazole-Trimethoprim] Unknown and Hives  ? Gabapentin Unknown  ? Tramadol Itching  ?  ?  ?Current Medications  ?      ?Current Outpatient Medications  ?Medication Sig Dispense Refill  ? baclofen (LIORESAL) 10 MG tablet Take by mouth      ? buPROPion (WELLBUTRIN XL) 150 MG XL tablet Take 1 tablet by mouth once daily      ? cetirizine (ZYRTEC) 10 MG tablet Take 10 mg by mouth once daily.      ? cholecalciferol (VITAMIN D3) 1000 unit capsule Take by mouth once daily.       ? cyanocobalamin (VITAMIN B12) 500 MCG tablet Take 500 mcg by mouth every other day      ? ESTRACE 0.01 % (0.1 mg/gram) vaginal cream        ? fluticasone propionate (FLONASE) 50 mcg/actuation nasal spray Place 1 spray into both nostrils 2 (two) times daily (Patient taking differently: Place 1 spray into both nostrils 2 (two) times daily as needed) 16 g 0  ? levothyroxine (SYNTHROID, LEVOTHROID) 75 MCG tablet Take 1 tablet (75 mcg total) by mouth once daily. Take on an empty stomach with a glass of water at least 30-60 minutes before breakfast. 90 tablet 1  ? montelukast (SINGULAIR) 10 mg tablet Take 10 mg by mouth once daily.      ? pantoprazole (PROTONIX) 40 MG DR tablet TAKE 1 TABLET TWICE A DAY 30 MINUTES BEFORE MEALS. 180 tablet 3  ? pregabalin (LYRICA) 100 MG capsule Take 21m in the morning and 1039mnightly for 1 month, then increase to 10040mwice  daily. 60 capsule 3  ? topiramate (TOPAMAX) 100 MG tablet Take 1 tablet (100 mg total) by mouth at bedtime 90 tablet 1  ? traZODone (DESYREL) 50 MG tablet Take 50 mg by mouth at bedtime      ? triamcinolone 0.1 % cream Apply topically      ? citalopram (CELEXA) 40 MG tablet Take 40 mg by mouth once daily. (Patient not taking: Reported on 10/10/2021)      ? cyanocobalamin (VITAMIN B12) 1000 MCG tablet Take by mouth once daily    (Patient not taking: Reported on 11/26/2021)      ? meloxicam (MOBIC) 15 MG tablet Take 15 mg by mouth once daily (Patient not taking: Reported on 11/26/2021)      ? rosuvastatin (CRESTOR) 10 MG tablet Take by mouth (Patient not taking: Reported on 11/26/2021)      ? zolpidem (AMBIEN CR) 6.25 MG CR tablet 5 mg (Patient not taking: Reported on 10/10/2021)      ?  ?No current facility-administered medications for this visit.  ?  ?  ?  ?     ?Family History  ?Problem Relation Age of Onset  ? Osteoarthritis Mother    ? Stroke Mother    ? Aneurysm Mother    ? Diabetes Father    ? Depression Father    ? Heart failure Father    ? Rheum arthritis Father    ? Epilepsy Sister    ? Cardiomyopathy (Abnormal function of the heart muscle) Sister    ? Alcohol abuse Sister    ? Drug abuse Sister    ? Depression Sister    ? Diabetes Brother    ? Myocardial Infarction (Heart attack) Maternal Grandmother    ?  Myocardial Infarction (Heart attack) Maternal Grandfather    ? Coronary Artery Disease (Blocked arteries around heart) Other    ? High blood pressure (Hypertension) Other    ? Breast cancer Maternal Aunt    ? Alcohol abuse Paternal Uncle    ?  ?  ?  ?Labs and Radiology:  ?  ?  ?Abdominal ultrasound Jan 02, 2022: ?  ?IMPRESSION: ?1. Cholelithiasis without acute cholecystitis or biliary duct ?dilatation. ?2. Gallbladder adenomyomatosis. ?3.  Increased hepatic echogenicity, favoring steatosis. ?  ?This study was personally reviewed. ?  ?Pain management evaluation of November 2022 reviewed. ?  ?Upper endoscopy  September 29, 2018 completed by Gaylyn Cheers, MD: ?  ?Hiatal hernia.  Esophagitis at 30 cm.  Diffuse erythema in the mucosa in the stomach. ?Pathology reported minimal chronic gastritis and foveolar hyperplasia.  No H. pylori.  Minimal reactive epithelial changes in the esophagus.  No evidence of his skin failed esophagitis. ?  ?Colonoscopy September 29, 2018, screening. ?  ?Single tubular adenoma in the transverse colon. ?  ?Single transverse colon polyp. ?  ?May 24, 2021 laboratory: ?  ?     ?WBC 3.8 - 10.8 Thousand/uL 10.4   ?RBC 3.80 - 5.10 Million/uL 4.50   ?Hemoglobin 11.7 - 15.5 g/dL 13.0   ?HCT 35.0 - 45.0 % 40.2   ?MCV 80.0 - 100.0 fL 89.3   ?MCH 27.0 - 33.0 pg 28.9   ?MCHC 32.0 - 36.0 g/dL 32.3   ?RDW 11.0 - 15.0 % 12.5   ?Platelets 140 - 400 Thousand/uL 260   ?MPV 7.5 - 12.5 fL 10.1   ?Specimen Collected: 05/24/21 14:34 Last Resulted: 05/25/21 11:59  ?Received From: [facility]  Result Received: 03/01/  ?  ?     ?Glucose, Bld 65 - 139 mg/dL 73   ?Comment: .  ?       Non-fasting reference interval  ?.  ?BUN 7 - 25 mg/dL 13   ?Creat 0.50 - 1.03 mg/dL 0.88   ?eGFR > OR = 60 mL/min/1.59m 79   ?Comment: The eGFR is based on the CKD-EPI 2021 equation. To calculate  ?the new eGFR from a previous Creatinine or Cystatin C  ?result, go to https://www.kidney.org/professionals/  ?kdoqi/gfr%5Fcalculator  ?BUN/Creatinine Ratio 6 - 22 (calc) NOT APPLICABLE   ?Sodium 135 - 146 mmol/L 142   ?Potassium 3.5 - 5.3 mmol/L 3.6   ?Chloride 98 - 110 mmol/L 110   ?CO2 20 - 32 mmol/L 26   ?Calcium 8.6 - 10.4 mg/dL 9.1   ?Total Protein 6.1 - 8.1 g/dL 5.8 Low    ?Albumin 3.6 - 5.1 g/dL 4.0   ?Globulin 1.9 - 3.7 g/dL (calc) 1.8 Low    ?AG Ratio 1.0 - 2.5 (calc) 2.2   ?Total Bilirubin 0.2 - 1.2 mg/dL 0.3   ?Alkaline phosphatase (APISO) 37 - 153 U/L 120   ?AST 10 - 35 U/L 17   ?ALT 6 - 29 U/L 25   ?Specimen Collected: 05/24/21 14:34 Last Resulted: 05/25/21 11:59  ?Received From: [facility]  Result Received: 10/10/21 14:36  ?  ?   ?  ?Review of Systems  ?Constitutional: Negative for chills and fever.  ?Respiratory: Negative for cough.   ?Gastrointestinal: Positive for nausea.  ?  ?  ?   ?Objective:  ? Physical Exam ?Exam conducted with a chape

## 2021-12-21 ENCOUNTER — Other Ambulatory Visit: Payer: Self-pay

## 2022-01-18 ENCOUNTER — Other Ambulatory Visit: Payer: Self-pay

## 2022-01-18 MED ORDER — BACLOFEN 10 MG PO TABS: 10.0000 mg | ORAL_TABLET | Freq: Three times a day (TID) | ORAL | 0 refills | Status: AC | PRN

## 2022-01-18 MED ORDER — BUPROPION HCL ER (XL) 300 MG PO TB24
300.0000 mg | ORAL_TABLET | Freq: Every day | ORAL | 0 refills | Status: DC
Start: 2022-01-18 — End: 2022-09-04

## 2022-01-18 MED ORDER — LEVOTHYROXINE SODIUM 75 MCG PO TABS: 75.0000 ug | ORAL_TABLET | Freq: Every day | ORAL | 0 refills | Status: AC

## 2022-01-18 MED ORDER — TRAZODONE HCL 50 MG PO TABS: 50.0000 mg | ORAL_TABLET | Freq: Every evening | ORAL | 0 refills | Status: AC | PRN

## 2022-01-22 ENCOUNTER — Other Ambulatory Visit: Payer: Self-pay

## 2022-01-31 ENCOUNTER — Telehealth: Payer: Self-pay | Admitting: Internal Medicine

## 2022-01-31 NOTE — Telephone Encounter (Signed)
Medication Refill - Medication: rosuvastatin (CRESTOR) 10 MG tablet [[id number]]  DISCONTINUED  Has the patient contacted their pharmacy? Yes.     Preferred Pharmacy (with phone number or street name):  [facility], [address] [place]  [place] [address]  Phone: [phone] Fax: [phone]  Hours: Not open 24 hours     Has the patient been seen for an appointment in the last year OR does the patient have an upcoming appointment? Yes.    Agent: Please be advised that RX refills may take up to 3 business days. We ask that you follow-up with your pharmacy.

## 2022-01-31 NOTE — Telephone Encounter (Signed)
Med is not on current med profile. Left VM to call back to clarify.

## 2022-02-01 ENCOUNTER — Inpatient Hospital Stay: Admission: RE | Admit: 2022-02-01 | Source: Ambulatory Visit

## 2022-02-01 HISTORY — DX: Dermatitis, unspecified: L30.9

## 2022-02-01 HISTORY — DX: Gastro-esophageal reflux disease without esophagitis: K21.9

## 2022-02-01 HISTORY — DX: Chronic pain syndrome: G89.4

## 2022-02-01 HISTORY — DX: Anxiety disorder, unspecified: F41.9

## 2022-02-01 HISTORY — DX: Hypothyroidism, unspecified: E03.9

## 2022-02-01 NOTE — Telephone Encounter (Signed)
Rosuvastatin sent to pharmacy.

## 2022-02-05 ENCOUNTER — Encounter
Admission: RE | Admit: 2022-02-05 | Discharge: 2022-02-05 | Disposition: A | Discharge status: Home / Self Care | Source: Ambulatory Visit | Attending: General Surgery | Admitting: General Surgery

## 2022-02-05 HISTORY — DX: Unspecified asthma, uncomplicated: J45.909

## 2022-02-06 ENCOUNTER — Other Ambulatory Visit: Payer: Self-pay | Admitting: Internal Medicine

## 2022-02-06 NOTE — Telephone Encounter (Signed)
Requested medication (s) are due for refill today: yes  Requested medication (s) are on the active medication list: yes  Last refill:  01/18/22 #90/0  Future visit scheduled: no  Notes to clinic:  Unable to refill per protocol due to failed labs, no updated results.    Requested Prescriptions  Pending Prescriptions Disp Refills   baclofen (LIORESAL) 10 MG tablet [Pharmacy Med Name: BACLOFEN TABS 10MG] 90 tablet 11    Sig: TAKE 1 TABLET THREE TIMES A DAY AS NEEDED FOR MUSCLE SPASMS     Analgesics:  Muscle Relaxants - baclofen Failed - 02/06/2022  9:39 AM      Failed - Cr in normal range and within 180 days    Creat  Date Value Ref Range Status  05/24/2021 0.88 0.50 - 1.03 mg/dL Final         Failed - eGFR is 30 or above and within 180 days    EGFR (African American)  Date Value Ref Range Status  05/29/2014 >60 >26m/min Final  06/28/2013 >60  Final   GFR calc Af Amer  Date Value Ref Range Status  01/17/2020 >60 >60 mL/min Final   EGFR (Non-African Amer.)  Date Value Ref Range Status  05/29/2014 >60 >690mmin Final    Comment:    eGFR values <6047min/1.73 m2 may be an indication of chronic kidney disease (CKD). Calculated eGFR, using the MRDR Study equation, is useful in  patients with stable renal function. The eGFR calculation will not be reliable in acutely ill patients when serum creatinine is changing rapidly. It is not useful in patients on dialysis. The eGFR calculation may not be applicable to patients at the low and high extremes of body sizes, pregnant women, and vegetarians.   06/28/2013 >60  Final    Comment:    eGFR values <48m49mn/1.73 m2 may be an indication of chronic kidney disease (CKD). Calculated eGFR is useful in patients with stable renal function. The eGFR calculation will not be reliable in acutely ill patients when serum creatinine is changing rapidly. It is not useful in  patients on dialysis. The eGFR calculation may not be applicable to  patients at the low and high extremes of body sizes, pregnant women, and vegetarians.    GFR calc non Af Amer  Date Value Ref Range Status  01/17/2020 58 (L) >60 mL/min Final   GFR  Date Value Ref Range Status  04/11/2020 64.09 >60.00 mL/min Final   eGFR  Date Value Ref Range Status  05/24/2021 79 > OR = 60 mL/min/1.73m285mal    Comment:    The eGFR is based on the CKD-EPI 2021 equation. To calculate  the new eGFR from a previous Creatinine or Cystatin C result, go to https://www.kidney.org/professionals/ kdoqi/gfr%5Fcalculator          Passed - Valid encounter within last 6 months    Recent Outpatient Visits           4 months ago Anxiety and depression   [facility]  8 months ago Encounter for general adult medical examination with abnormal findings   [facility]  8 months ago Acute viral syndrome   [facility]  11 months ago Excessive daytime sleepiness   [facility]  [place] Psychophysiological insomnia   [facility]

## 2022-02-08 ENCOUNTER — Other Ambulatory Visit: Payer: Self-pay

## 2022-02-08 ENCOUNTER — Ambulatory Visit
Admission: RE | Admit: 2022-02-08 | Discharge: 2022-02-08 | Disposition: A | Discharge status: Home / Self Care | Attending: General Surgery | Admitting: General Surgery

## 2022-02-08 ENCOUNTER — Ambulatory Visit: Admitting: Certified Registered Nurse Anesthetist

## 2022-02-08 ENCOUNTER — Ambulatory Visit

## 2022-02-08 ENCOUNTER — Encounter: Payer: Self-pay | Admitting: General Surgery

## 2022-02-08 ENCOUNTER — Encounter
Admission: RE | Disposition: A | Discharge status: Home / Self Care | Payer: Self-pay | Source: Home / Self Care | Attending: General Surgery

## 2022-02-08 DIAGNOSIS — F419 Anxiety disorder, unspecified: Secondary | ICD-10-CM | POA: Insufficient documentation

## 2022-02-08 DIAGNOSIS — Z79899 Other long term (current) drug therapy: Secondary | ICD-10-CM | POA: Diagnosis not present

## 2022-02-08 DIAGNOSIS — G43909 Migraine, unspecified, not intractable, without status migrainosus: Secondary | ICD-10-CM | POA: Insufficient documentation

## 2022-02-08 DIAGNOSIS — E039 Hypothyroidism, unspecified: Secondary | ICD-10-CM | POA: Diagnosis not present

## 2022-02-08 DIAGNOSIS — F32A Depression, unspecified: Secondary | ICD-10-CM | POA: Insufficient documentation

## 2022-02-08 DIAGNOSIS — J45909 Unspecified asthma, uncomplicated: Secondary | ICD-10-CM | POA: Diagnosis not present

## 2022-02-08 DIAGNOSIS — K8010 Calculus of gallbladder with chronic cholecystitis without obstruction: Secondary | ICD-10-CM | POA: Diagnosis present

## 2022-02-08 DIAGNOSIS — L309 Dermatitis, unspecified: Secondary | ICD-10-CM | POA: Diagnosis not present

## 2022-02-08 DIAGNOSIS — K219 Gastro-esophageal reflux disease without esophagitis: Secondary | ICD-10-CM | POA: Diagnosis not present

## 2022-02-08 HISTORY — PX: CHOLECYSTECTOMY: SHX55

## 2022-02-08 SURGERY — LAPAROSCOPIC CHOLECYSTECTOMY WITH INTRAOPERATIVE CHOLANGIOGRAM
Anesthesia: General | Site: Abdomen

## 2022-02-08 MED ORDER — ONDANSETRON HCL 4 MG/2ML IJ SOLN: 4.0000 mg | Freq: Once | INTRAMUSCULAR | Status: DC | PRN

## 2022-02-08 MED ORDER — CHLORHEXIDINE GLUCONATE CLOTH 2 % EX PADS: 6.0000 | MEDICATED_PAD | Freq: Once | CUTANEOUS | Status: DC

## 2022-02-08 MED ORDER — GLYCOPYRROLATE 0.2 MG/ML IJ SOLN
INTRAMUSCULAR | Status: AC
  Filled 2022-02-08: qty 1

## 2022-02-08 MED ORDER — OXYCODONE HCL 5 MG PO TABS
5.0000 mg | ORAL_TABLET | Freq: Once | ORAL | Status: AC
  Administered 2022-02-08: 5 mg via ORAL

## 2022-02-08 MED ORDER — FENTANYL CITRATE (PF) 100 MCG/2ML IJ SOLN
25.0000 ug | INTRAMUSCULAR | Status: DC | PRN
  Administered 2022-02-08: 25 ug via INTRAVENOUS

## 2022-02-08 MED ORDER — ACETAMINOPHEN 10 MG/ML IV SOLN
INTRAVENOUS | Status: AC
Start: 2022-02-08 — End: ?
  Filled 2022-02-08: qty 100

## 2022-02-08 MED ORDER — DEXAMETHASONE SODIUM PHOSPHATE 10 MG/ML IJ SOLN
INTRAMUSCULAR | Status: AC
  Filled 2022-02-08: qty 1

## 2022-02-08 MED ORDER — FAMOTIDINE 20 MG PO TABS: 20.0000 mg | ORAL_TABLET | Freq: Once | ORAL | Status: AC

## 2022-02-08 MED ORDER — KETOROLAC TROMETHAMINE 30 MG/ML IJ SOLN
INTRAMUSCULAR | Status: DC | PRN
  Administered 2022-02-08: 30 mg via INTRAVENOUS

## 2022-02-08 MED ORDER — HYDROMORPHONE HCL 1 MG/ML IJ SOLN
INTRAMUSCULAR | Status: AC
  Filled 2022-02-08: qty 1

## 2022-02-08 MED ORDER — LIDOCAINE HCL (CARDIAC) PF 100 MG/5ML IV SOSY
PREFILLED_SYRINGE | INTRAVENOUS | Status: DC | PRN
  Administered 2022-02-08: 80 mg via INTRAVENOUS

## 2022-02-08 MED ORDER — ONDANSETRON HCL 4 MG/2ML IJ SOLN
INTRAMUSCULAR | Status: AC
  Filled 2022-02-08: qty 2

## 2022-02-08 MED ORDER — ACETAMINOPHEN 10 MG/ML IV SOLN
INTRAVENOUS | Status: DC | PRN
  Administered 2022-02-08: 1000 mg via INTRAVENOUS

## 2022-02-08 MED ORDER — MIDAZOLAM HCL 2 MG/2ML IJ SOLN
INTRAMUSCULAR | Status: DC | PRN
  Administered 2022-02-08: 2 mg via INTRAVENOUS

## 2022-02-08 MED ORDER — OXYCODONE HCL 5 MG PO TABS
ORAL_TABLET | ORAL | Status: AC
  Filled 2022-02-08: qty 1

## 2022-02-08 MED ORDER — FENTANYL CITRATE (PF) 100 MCG/2ML IJ SOLN
INTRAMUSCULAR | Status: AC
  Filled 2022-02-08: qty 2

## 2022-02-08 MED ORDER — ROCURONIUM BROMIDE 10 MG/ML (PF) SYRINGE
PREFILLED_SYRINGE | INTRAVENOUS | Status: AC
  Filled 2022-02-08: qty 10

## 2022-02-08 MED ORDER — FAMOTIDINE 20 MG PO TABS
ORAL_TABLET | ORAL | Status: AC
  Administered 2022-02-08: 20 mg via ORAL
  Filled 2022-02-08: qty 1

## 2022-02-08 MED ORDER — CHLORHEXIDINE GLUCONATE 0.12 % MT SOLN: 15.0000 mL | Freq: Once | OROMUCOSAL | Status: AC

## 2022-02-08 MED ORDER — 0.9 % SODIUM CHLORIDE (POUR BTL) OPTIME
TOPICAL | Status: DC | PRN
  Administered 2022-02-08: 500 mL

## 2022-02-08 MED ORDER — LACTATED RINGERS IV SOLN: INTRAVENOUS | Status: DC

## 2022-02-08 MED ORDER — PROPOFOL 10 MG/ML IV BOLUS
INTRAVENOUS | Status: DC | PRN
  Administered 2022-02-08: 130 mg via INTRAVENOUS

## 2022-02-08 MED ORDER — DEXAMETHASONE SODIUM PHOSPHATE 10 MG/ML IJ SOLN
INTRAMUSCULAR | Status: DC | PRN
  Administered 2022-02-08: 10 mg via INTRAVENOUS

## 2022-02-08 MED ORDER — FENTANYL CITRATE (PF) 100 MCG/2ML IJ SOLN
INTRAMUSCULAR | Status: DC | PRN
Start: 2022-02-08 — End: 2022-02-08
  Administered 2022-02-08 (×2): 50 ug via INTRAVENOUS

## 2022-02-08 MED ORDER — IOHEXOL 180 MG/ML  SOLN
INTRAMUSCULAR | Status: DC | PRN
  Administered 2022-02-08: 10 mL

## 2022-02-08 MED ORDER — KETOROLAC TROMETHAMINE 30 MG/ML IJ SOLN
INTRAMUSCULAR | Status: AC
  Filled 2022-02-08: qty 1

## 2022-02-08 MED ORDER — CHLORHEXIDINE GLUCONATE 0.12 % MT SOLN
OROMUCOSAL | Status: AC
  Administered 2022-02-08: 15 mL via OROMUCOSAL
  Filled 2022-02-08: qty 15

## 2022-02-08 MED ORDER — HYDROCODONE-ACETAMINOPHEN 5-325 MG PO TABS: 1.0000 | ORAL_TABLET | ORAL | 0 refills | Status: DC | PRN

## 2022-02-08 MED ORDER — SODIUM CHLORIDE (PF) 0.9 % IJ SOLN
INTRAMUSCULAR | Status: DC | PRN
  Administered 2022-02-08: 10 mL

## 2022-02-08 MED ORDER — HYDROMORPHONE HCL 1 MG/ML IJ SOLN
INTRAMUSCULAR | Status: DC | PRN
  Administered 2022-02-08 (×2): .5 mg via INTRAVENOUS

## 2022-02-08 MED ORDER — PROPOFOL 10 MG/ML IV BOLUS
INTRAVENOUS | Status: AC
  Filled 2022-02-08: qty 40

## 2022-02-08 MED ORDER — MIDAZOLAM HCL 2 MG/2ML IJ SOLN
INTRAMUSCULAR | Status: AC
  Filled 2022-02-08: qty 2

## 2022-02-08 MED ORDER — SUGAMMADEX SODIUM 200 MG/2ML IV SOLN
INTRAVENOUS | Status: DC | PRN
  Administered 2022-02-08: 252.4 mg via INTRAVENOUS

## 2022-02-08 MED ORDER — ORAL CARE MOUTH RINSE: 15.0000 mL | Freq: Once | OROMUCOSAL | Status: AC

## 2022-02-08 MED ORDER — ONDANSETRON HCL 4 MG/2ML IJ SOLN
INTRAMUSCULAR | Status: DC | PRN
  Administered 2022-02-08: 4 mg via INTRAVENOUS

## 2022-02-08 MED ORDER — SODIUM CHLORIDE FLUSH 0.9 % IV SOLN
INTRAVENOUS | Status: AC
  Filled 2022-02-08: qty 10

## 2022-02-08 MED ORDER — ROCURONIUM BROMIDE 100 MG/10ML IV SOLN
INTRAVENOUS | Status: DC | PRN
  Administered 2022-02-08: 10 mg via INTRAVENOUS
  Administered 2022-02-08: 40 mg via INTRAVENOUS

## 2022-02-08 MED ORDER — LIDOCAINE HCL (PF) 2 % IJ SOLN
INTRAMUSCULAR | Status: AC
  Filled 2022-02-08: qty 5

## 2022-02-08 SURGICAL SUPPLY — 38 items
APPLIER CLIP ROT 10 11.4 M/L (STAPLE) ×2
BLADE SURG 11 STRL SS SAFETY (MISCELLANEOUS) ×2 IMPLANT
CANNULA DILATOR 10 W/SLV (CANNULA) ×2 IMPLANT
CANNULA DILATOR 5 W/SLV (CANNULA) ×4 IMPLANT
CATH CHOLANG 76X19 KUMAR (CATHETERS) ×2 IMPLANT
CHLORAPREP W/TINT 26 (MISCELLANEOUS) ×2 IMPLANT
CLIP APPLIE ROT 10 11.4 M/L (STAPLE) ×1 IMPLANT
CORD MONOPOLAR M/FML 12FT (MISCELLANEOUS) ×2 IMPLANT
DRAPE C-ARM 42X70 (DRAPES) ×2 IMPLANT
DRSG TEGADERM 2-3/8X2-3/4 SM (GAUZE/BANDAGES/DRESSINGS) ×8 IMPLANT
DRSG TELFA 4X3 1S NADH ST (GAUZE/BANDAGES/DRESSINGS) ×2 IMPLANT
ELECT REM PT RETURN 9FT ADLT (ELECTROSURGICAL) ×2
ELECTRODE REM PT RTRN 9FT ADLT (ELECTROSURGICAL) ×1 IMPLANT
GLOVE BIO SURGEON STRL SZ7.5 (GLOVE) ×2 IMPLANT
GLOVE SURG UNDER LTX SZ8 (GLOVE) ×2 IMPLANT
GOWN STRL REUS W/ TWL LRG LVL3 (GOWN DISPOSABLE) ×3 IMPLANT
GOWN STRL REUS W/TWL LRG LVL3 (GOWN DISPOSABLE) ×3
IRRIGATION STRYKERFLOW (MISCELLANEOUS) ×1 IMPLANT
IRRIGATOR STRYKERFLOW (MISCELLANEOUS) ×2
IV LACTATED RINGERS 1000ML (IV SOLUTION) ×2 IMPLANT
KIT TURNOVER KIT A (KITS) ×2 IMPLANT
LABEL OR SOLS (LABEL) ×2 IMPLANT
MANIFOLD NEPTUNE II (INSTRUMENTS) ×2 IMPLANT
NDL INSUFF ACCESS 14 VERSASTEP (NEEDLE) ×2 IMPLANT
NS IRRIG 500ML POUR BTL (IV SOLUTION) ×2 IMPLANT
PACK LAP CHOLECYSTECTOMY (MISCELLANEOUS) ×2 IMPLANT
SCISSORS METZENBAUM CVD 33 (INSTRUMENTS) ×2 IMPLANT
SET TUBE SMOKE EVAC HIGH FLOW (TUBING) ×2 IMPLANT
SPONGE KITTNER 5P (MISCELLANEOUS) IMPLANT
STRIP CLOSURE SKIN 1/2X4 (GAUZE/BANDAGES/DRESSINGS) ×2 IMPLANT
SUT VIC AB 0 CT2 27 (SUTURE) ×1 IMPLANT
SUT VIC AB 4-0 FS2 27 (SUTURE) ×2 IMPLANT
SWABSTK COMLB BENZOIN TINCTURE (MISCELLANEOUS) ×2 IMPLANT
SYS BAG RETRIEVAL 10MM (BASKET)
SYSTEM BAG RETRIEVAL 10MM (BASKET) IMPLANT
TROCAR XCEL NON-BLD 11X100MML (ENDOMECHANICALS) ×2 IMPLANT
WATER STERILE IRR 1000ML POUR (IV SOLUTION) ×2 IMPLANT
WATER STERILE IRR 500ML POUR (IV SOLUTION) ×2 IMPLANT

## 2022-02-08 NOTE — Discharge Instructions (Signed)

## 2022-02-08 NOTE — TOC Initial Note (Signed)
Transition of Care [facility]) - Initial/Assessment Note    Patient Details  Name: Grace Shepard MRN: [id number] Date of Birth: 1965-12-29  [facility]) CM/SW Contact:    Conception Oms, RN Phone Number: 02/08/2022, 9:56 AM  Clinical Narrative:                   Transition of Care [facility]) Screening Note   Patient Details  Name: Grace Shepard Date of Birth: 1965/09/18   [facility]) CM/SW Contact:    Conception Oms, RN Phone Number: 02/08/2022, 9:56 AM    Transition of Care [facility]) has reviewed patient and no TOC needs have been identified at this time. We will continue to monitor patient advancement through interdisciplinary progression rounds. If new patient transition needs arise, please place a TOC consult.         Patient Goals and CMS Choice        Expected Discharge Plan and Services           Expected Discharge Date: 02/08/22                                    Prior Living Arrangements/Services                       Activities of Daily Living      Permission Sought/Granted                  Emotional Assessment              Admission diagnosis:  gallstones Patient Active Problem List   Diagnosis Date Noted   Lichen sclerosus 62/37/6283   Class 1 obesity due to excess calories with body mass index (BMI) of 30.0 to 30.9 in adult 02/19/2021   Eczema 12/11/2020   Postmenopausal atrophic vaginitis 12/11/2020   Other spondylosis, sacral and sacrococcygeal region 02/08/2020   Enthesopathy of hip region (Left) 02/08/2020   Greater trochanteric bursitis of hips (Bilateral) 02/08/2020   Chronic sacroiliac joint pain (Right) 02/07/2020   Sacroiliac joint dysfunction (Bilateral) (R>L) 02/07/2020   Trochanteric bursitis of hip (Bilateral) 02/07/2020   Chronic hip pain (Left) 02/07/2020   Trochanteric bursitis, right hip 02/07/2020   Chronic pain syndrome 01/17/2020   Chronic lower extremity  pain (2ry area of Pain) (Bilateral) (R>L) 01/17/2020   Chronic hip pain (Bilateral) (R>L) 01/17/2020   Chronic upper extremity pain (Bilateral) 01/17/2020   Cervicalgia (Right) 01/17/2020   Chronic occipital headache (Right) 01/17/2020   Cervicogenic headache (Right) 01/17/2020   Occipital neuralgia (lesser occipital nerve) (Right) 01/17/2020   Cervical facet syndrome (Right) 01/17/2020   Cervico-occipital neuralgia (Right) 01/17/2020   Chronic sacroiliac joint pain (Bilateral) (R>L) 01/17/2020   Lumbar facet syndrome (Bilateral) (R>L) 01/17/2020   HLD (hyperlipidemia) 03/04/2019   Anxiety and depression 05/20/2016   GERD (gastroesophageal reflux disease) 06/15/2015   Chronic low back pain (1ry area of Pain) (Bilateral) (R>L) w/o sciatica 06/02/2015   Insomnia 05/22/2015   Fibromyalgia 04/21/2015   Migraines 04/21/2015   Acquired hypothyroidism 12/19/2014   PCP:  [name], NP Pharmacy:   [facility], [place] [address] Phone: [phone] Fax: [phone]  [id number] [place] [address] [id number]: [phone]:  [place] [address] Phone: [phone] Fax: [phone]  [facility] [address], [place] - [place] [address] Phone: [phone] Fax: [phone]     Social Determinants of Health (SDOH) Interventions    Readmission Risk Interventions     No data to display

## 2022-02-08 NOTE — Op Note (Signed)
Preoperative diagnosis: Chronic cholecystitis and cholelithiasis.  Postoperative diagnosis: Same.  Operative procedure: Laparoscopic cholecystectomy with intraoperative cholangiograms.  Operating surgeon: Hervey Ard, MD.  Assistant: Elsie Stain, RNFA.  Estimated blood loss: Less than 5 cc.  Clinical note: This 56 year old woman has had multiple episodes of right upper quadrant pain and ultrasound showed evidence of gallstones and adenomyosis of the gallbladder.  She is felt to be a candidate for elective cholecystectomy.  The patient had SCD stockings for DVT prevention.  Antibiotics were not indicated.  Operative note: The patient under adequate general endotracheal anesthesia the abdomen was cleansed with ChloraPrep and draped.  In Trendelenburg position a varies needle was placed with transumbilical incision.  After assuring intra-abdominal location with a hanging drop test the abdomen was insufflated with CO2 at 10 mmHg pressure.  A 10 mm Neck step port was expanded and showed no evidence of injury from initial port placement.  The patient was placed in reverse Trendelenburg position and rolled to the left.  An 11 mm XL port was placed in the epigastrium and 2-5 mm Ports were placed in the right lateral abdominal wall.  There were noted to be a few filmy adhesions between the omentum and the undersurface of the gallbladder.  The gallbladder was placed on cephalad traction.  The adhesions were taken down with cautery dissection and were not impressively vascular.  The neck of the gallbladder was cleared.  A Kumar clamp was placed and fluoroscopic cholangiograms completed with 20 cc of 1 half-strength Conray 60.  This showed a spiraling cystic duct reflux into the common hepatic duct free flow into the common bile duct and into the duodenum.  No clear evidence of retained stones.  The cystic duct was doubly clipped and divided as was the cystic artery.  The gallbladder was then removed from  the gallbladder bed making use of hook cautery dissection.  The camera was moved to the epigastric port site and the gallbladder delivered to the umbilical site without incident.  Inspection from the epigastric site again showed no evidence of injury.  The right upper quadrant was irrigated with lactated Ringer solution.  Good hemostasis was noted.  The abdomen was desufflated under direct vision and ports removed.  Skin incisions were closed with 4-0 Vicryl subcuticular sutures.  Benzoin, Steri-Strips, Telfa and Tegaderm dressings were applied.  The patient tolerated the procedure well and was taken to the PACU in stable condition.

## 2022-02-08 NOTE — Transfer of Care (Signed)
Immediate Anesthesia Transfer of Care Note  Patient: Grace Shepard  Procedure(s) Performed: LAPAROSCOPIC CHOLECYSTECTOMY WITH INTRAOPERATIVE CHOLANGIOGRAM (Abdomen)  Patient Location: PACU  Anesthesia Type:General  Level of Consciousness: awake and alert   Airway & Oxygen Therapy: Patient Spontanous Breathing and Patient connected to nasal cannula oxygen  Post-op Assessment: Report given to RN and Post -op Vital signs reviewed and stable  Post vital signs: Reviewed and stable  Last Vitals:  Vitals Value Taken Time  BP 127/66 02/08/22 0836  Temp 36.1 C 02/08/22 0836  Pulse 79 02/08/22 0840  Resp 9 02/08/22 0840  SpO2 96 % 02/08/22 0840  Vitals shown include unvalidated device data.  Last Pain:  Vitals:   02/08/22 0836  TempSrc:   PainSc: Asleep         Complications:  Encounter Notable Events  Notable Event Outcome Phase Comment  Difficult to intubate - expected  Intraprocedure Filed from anesthesia note documentation.

## 2022-02-08 NOTE — Anesthesia Procedure Notes (Signed)
Procedure Name: Intubation Date/Time: 02/08/2022 7:40 AM  Performed by: Demetrius Charity, CRNAPre-anesthesia Checklist: Patient identified, Patient being monitored, Timeout performed, Emergency Drugs available and Suction available Patient Re-evaluated:Patient Re-evaluated prior to induction Oxygen Delivery Method: Circle system utilized Preoxygenation: Pre-oxygenation with 100% oxygen Induction Type: IV induction Ventilation: Mask ventilation without difficulty Laryngoscope Size: 3 and McGraph Grade View: Grade III Tube type: Oral Tube size: 6.5 mm Number of attempts: 1 Airway Equipment and Method: Stylet, Video-laryngoscopy and Bougie stylet Placement Confirmation: ETT inserted through vocal cords under direct vision, positive ETCO2 and breath sounds checked- equal and bilateral Secured at: 20 cm Tube secured with: Tape Dental Injury: Teeth and Oropharynx as per pre-operative assessment  Difficulty Due To: Difficulty was anticipated, Difficult Airway- due to anterior larynx, Difficult Airway- due to limited oral opening and Difficult Airway- due to reduced neck mobility Future Recommendations: Recommend- induction with short-acting agent, and alternative techniques readily available

## 2022-02-08 NOTE — Anesthesia Preprocedure Evaluation (Signed)
Anesthesia Evaluation  Patient identified by MRN, date of birth, ID band Patient awake    Reviewed: Allergy & Precautions, H&P , NPO status , Patient's Chart, lab work & pertinent test results, reviewed documented beta blocker date and time   Airway Mallampati: II  TM Distance: >3 FB Neck ROM: full    Dental  (+) Teeth Intact   Pulmonary neg shortness of breath, asthma ,    Pulmonary exam normal        Cardiovascular Exercise Tolerance: Poor negative cardio ROS Normal cardiovascular exam Rhythm:regular Rate:Normal     Neuro/Psych  Headaches, PSYCHIATRIC DISORDERS Anxiety Depression  Neuromuscular disease    GI/Hepatic Neg liver ROS, GERD  Medicated,  Endo/Other  Hypothyroidism   Renal/GU negative Renal ROS  negative genitourinary   Musculoskeletal   Abdominal   Peds  Hematology negative hematology ROS (+)   Anesthesia Other Findings Past Medical History: No date: Allergic rhinitis No date: Anxiety No date: Arthritis No date: Asthma No date: Chronic pain syndrome No date: Depression No date: Eczema No date: Fibromyalgia No date: GERD (gastroesophageal reflux disease) 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 No date: Hyperlipidemia No date: Hypothyroidism No date: Ischemic colitis ([place]) No date: Migraine No date: Thyroid disease No date: UTI (lower urinary tract infection) Past Surgical History: No date: TUBAL LIGATION BMI    Body Mass Index: 29.05 kg/m     Reproductive/Obstetrics negative OB ROS                             Anesthesia Physical Anesthesia Plan  ASA: 3  Anesthesia Plan: General ETT   Post-op Pain Management:    Induction:   PONV Risk Score and Plan:   Airway Management Planned:   Additional Equipment:   Intra-op Plan:   Post-operative Plan:   Informed Consent: I have reviewed the patients History and Physical,  chart, labs and discussed the procedure including the risks, benefits and alternatives for the proposed anesthesia with the patient or authorized representative who has indicated his/her understanding and acceptance.     Dental Advisory Given  Plan Discussed with: CRNA  Anesthesia Plan Comments:         Anesthesia Quick Evaluation

## 2022-02-08 NOTE — H&P (Signed)
Grace Shepard [id number] 01-29-1966     HPI: Healthy 56 y/o woman with symptomatic cholelithiasis.  Minimal symptoms since OV in May.   Medications Prior to Admission  Medication Sig Dispense Refill Last Dose   baclofen (LIORESAL) 10 MG tablet Take 1 tablet (10 mg total) by mouth 3 (three) times daily as needed for muscle spasms. 90 each 0 Past Month   buPROPion (WELLBUTRIN XL) 300 MG 24 hr tablet Take 1 tablet (300 mg total) by mouth daily. 90 tablet 0 02/07/2022   cetirizine (ZYRTEC) 10 MG tablet Take 10 mg by mouth daily.   02/07/2022   estradiol (ESTRACE) 0.1 MG/GM vaginal cream Place 1 Applicatorful vaginally 3 (three) times a week. 42.5 g 2 Past Month   levothyroxine (SYNTHROID) 75 MCG tablet Take 1 tablet (75 mcg total) by mouth daily. 90 tablet 0 02/07/2022   montelukast (SINGULAIR) 10 MG tablet TAKE 1 TABLET AT BEDTIME 90 tablet 3 02/07/2022   pantoprazole (PROTONIX) 40 MG tablet Take 1 tablet (40 mg total) by mouth daily. 90 tablet 2 02/07/2022   pregabalin (LYRICA) 75 MG capsule Take 1 capsule (75 mg total) by mouth 2 (two) times daily. 180 capsule 0 02/07/2022   topiramate (TOPAMAX) 100 MG tablet Take 100 mg by mouth daily.   02/07/2022   traZODone (DESYREL) 50 MG tablet Take 1-2 tablets (50-100 mg total) by mouth at bedtime as needed for sleep. 180 tablet 0 02/07/2022   albuterol (VENTOLIN HFA) 108 (90 Base) MCG/ACT inhaler USE 1 INHALATION EVERY 4 HOURS AS NEEDED FOR WHEEZING OR SHORTNESS OF BREATH (Patient not taking: Reported on 02/08/2022) 17 g 4 Not Taking   Cholecalciferol (VITAMIN D3) 125 MCG (5000 UT) CAPS Take 4,000 Units by mouth.   02/04/2022   clobetasol cream (TEMOVATE) 2.53 % Apply 1 application topically 2 (two) times daily. 30 g 0    ipratropium (ATROVENT) 0.06 % nasal spray Place 2 sprays into both nostrils 4 (four) times daily. For up to 5-7 days then stop. (Patient not taking: Reported on 02/08/2022) 15 mL 0 Not Taking   OVER THE COUNTER MEDICATION Colon Clenz   02/06/2022    OVER THE COUNTER MEDICATION Kyolic (aged garlic   6/64/4034   triamcinolone cream (KENALOG) 0.1 % APPLY 1 APPLICATION TOPICALLY TWICE A DAY AS NEEDED. AVOID FACE, GROIN, AXILLA 60 g 11    vitamin B-12 (CYANOCOBALAMIN) 1000 MCG tablet Take 50 mcg by mouth every other day.    02/04/2022   vitamin E 180 MG (400 UNITS) capsule Take 400 Units by mouth daily.   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   Hyperlipidemia    Hypothyroidism    Ischemic colitis ([place])    Migraine    Thyroid disease    UTI (lower urinary tract infection)    Past Surgical History:  Procedure Laterality Date   TUBAL LIGATION     Social History   Socioeconomic History   Marital status: Married    Spouse name: Not on file   Number of children: Not on file  Years of education: Not on file   Highest education level: Not on file  Occupational History   Not on file  Tobacco Use   Smoking status: Never   Smokeless tobacco: Never  Vaping Use   Vaping Use: Never used  Substance and Sexual Activity   Alcohol use: No   Drug use: No   Sexual activity: Yes    Comment: Perimenopausal  Other Topics Concern   Not on file  Social History Narrative   Not on file   Social Determinants of Health   Financial Resource Strain: Not on file  Food Insecurity: Not on file  Transportation Needs: Not on file  Physical Activity: Not on file  Stress: Not on file  Social Connections: Not on file  Intimate Partner Violence: Not on file   Social History   Social History Narrative   Not on file     ROS: Negative.     PE: HEENT:  Negative. Lungs: Clear. Cardio: RR.   Assessment/Plan:  Proceed with planned cholecystectomy.   Forest Gleason Anum Palecek 02/08/2022   Assessment/Plan:  Proceed with planned cholecystectomy.

## 2022-02-11 LAB — SURGICAL PATHOLOGY

## 2022-02-13 NOTE — Anesthesia Postprocedure Evaluation (Signed)
Anesthesia Post Note  Patient: Grace Shepard  Procedure(s) Performed: LAPAROSCOPIC CHOLECYSTECTOMY WITH INTRAOPERATIVE CHOLANGIOGRAM (Abdomen)  Patient location during evaluation: PACU Anesthesia Type: General Level of consciousness: awake and alert Pain management: pain level controlled Vital Signs Assessment: post-procedure vital signs reviewed and stable Respiratory status: spontaneous breathing, nonlabored ventilation, respiratory function stable and patient connected to nasal cannula oxygen Cardiovascular status: blood pressure returned to baseline and stable Postop Assessment: no apparent nausea or vomiting Anesthetic complications: yes   Encounter Notable Events  Notable Event Outcome Phase Comment  Difficult to intubate - expected  Intraprocedure Filed from anesthesia note documentation.     Last Vitals:  Vitals:   02/08/22 0938 02/08/22 1024  BP: 115/74 107/63  Pulse: 71   Resp: 18   Temp: 36.5 C   SpO2: 97%     Last Pain:  Vitals:   02/08/22 0938  TempSrc: Temporal  PainSc: [place] Teyah Rossy

## 2022-02-15 ENCOUNTER — Encounter: Admission: RE | Payer: Self-pay | Source: Home / Self Care

## 2022-02-15 ENCOUNTER — Ambulatory Visit: Admission: RE | Admit: 2022-02-15 | Source: Home / Self Care

## 2022-02-15 SURGERY — COLONOSCOPY WITH PROPOFOL
Anesthesia: General

## 2022-02-20 ENCOUNTER — Other Ambulatory Visit: Payer: Self-pay | Admitting: Internal Medicine

## 2022-02-20 NOTE — Telephone Encounter (Signed)
Requested medication (s) are due for refill today - no  Requested medication (s) are on the active medication list -no  Future visit scheduled -no  Last refill: unknown  Notes to clinic: medication no longer on current medication list  Requested Prescriptions  Pending Prescriptions Disp Refills   citalopram (CELEXA) 40 MG tablet [Pharmacy Med Name: CITALOPRAM HYDROBROMIDE TABS '40MG'$ ] 90 tablet 3    Sig: TAKE 1 TABLET DAILY     Psychiatry:  Antidepressants - SSRI Passed - 02/20/2022  2:04 PM      Passed - Completed PHQ-2 or PHQ-9 in the last 360 days      Passed - Valid encounter within last 6 months    Recent Outpatient Visits           4 months ago Anxiety and depression   [place], Coralie Keens, NP   9 months ago Encounter for general adult medical examination with abnormal findings   [facility] [place], Coralie Keens, NP   9 months ago Acute viral syndrome   [facility] Olin Hauser, DO   11 months ago Excessive daytime sleepiness   [facility] [place], Coralie Keens, NP   1 year ago Psychophysiological insomnia   [facility] [place], Coralie Keens, NP                 Requested Prescriptions  Pending Prescriptions Disp Refills   citalopram (CELEXA) 40 MG tablet [Pharmacy Med Name: CITALOPRAM HYDROBROMIDE TABS '40MG'$ ] 90 tablet 3    Sig: TAKE 1 TABLET DAILY     Psychiatry:  Antidepressants - SSRI Passed - 02/20/2022  2:04 PM      Passed - Completed PHQ-2 or PHQ-9 in the last 360 days      Passed - Valid encounter within last 6 months    Recent Outpatient Visits           4 months ago Anxiety and depression   [place], Coralie Keens, NP   9 months ago Encounter for general adult medical examination with abnormal findings   [facility] [place], Coralie Keens, NP   9 months ago Acute viral syndrome   [place], DO    11 months ago Excessive daytime sleepiness   [facility] [place], Coralie Keens, NP   1 year ago Psychophysiological insomnia   [facility] [place], Coralie Keens, NP

## 2022-04-25 ENCOUNTER — Other Ambulatory Visit: Payer: Self-pay | Admitting: Internal Medicine

## 2022-04-25 DIAGNOSIS — Z1231 Encounter for screening mammogram for malignant neoplasm of breast: Secondary | ICD-10-CM

## 2022-04-26 ENCOUNTER — Ambulatory Visit
Admission: EM | Admit: 2022-04-26 | Discharge: 2022-04-26 | Disposition: A | Discharge status: Home / Self Care | Attending: Emergency Medicine | Admitting: Emergency Medicine

## 2022-04-26 ENCOUNTER — Encounter: Payer: Self-pay | Admitting: Emergency Medicine

## 2022-04-26 DIAGNOSIS — R102 Pelvic and perineal pain: Secondary | ICD-10-CM | POA: Insufficient documentation

## 2022-04-26 DIAGNOSIS — R300 Dysuria: Secondary | ICD-10-CM | POA: Diagnosis not present

## 2022-04-26 LAB — URINALYSIS, ROUTINE W REFLEX MICROSCOPIC
Bilirubin Urine: NEGATIVE
Glucose, UA: NEGATIVE mg/dL
Hgb urine dipstick: NEGATIVE
Ketones, ur: NEGATIVE mg/dL
Leukocytes,Ua: NEGATIVE
Nitrite: NEGATIVE
Protein, ur: NEGATIVE mg/dL
Specific Gravity, Urine: 1.02 (ref 1.005–1.030)
pH: 7 (ref 5.0–8.0)

## 2022-04-26 LAB — WET PREP, GENITAL
Clue Cells Wet Prep HPF POC: NONE SEEN
Sperm: NONE SEEN
Trich, Wet Prep: NONE SEEN
WBC, Wet Prep HPF POC: <10 — AB (ref <10)
Yeast Wet Prep HPF POC: NONE SEEN

## 2022-04-26 MED ORDER — PHENAZOPYRIDINE HCL 200 MG PO TABS: 200.0000 mg | ORAL_TABLET | Freq: Three times a day (TID) | ORAL | 0 refills | Status: DC

## 2022-04-26 NOTE — ED Triage Notes (Signed)
Patient c/o RLQ abdominal pain, dysuria, and urinary frequency that started 3 days ago.  Patient denies N/V.

## 2022-04-26 NOTE — Discharge Instructions (Signed)
Urinalysis today was negative for signs of infection, as you are having urinary symptoms we have sent to the lab to determine if you will grow bacteria, if this occurs you will be notified and antibiotics in 10  Your wet prep checking for yeast, bacterial vaginosis and trichomoniasis is also negative  Given use of Pyridium as needed, may use every 8 hours, this medicine will turn your urine a neon orange, it will return to normal color once medication is stopped, this medicine helps to reduce urinary symptoms  Increase your fluid intake through use of water  As always practice good hygiene, wiping front to back and avoidance of scented vaginal products to prevent further irritation  If symptoms continue to persist after use of medication or recur please follow-up with urgent care or your primary doctor as needed

## 2022-04-26 NOTE — ED Provider Notes (Signed)
[facility]    CSN: [id number] Arrival date & time: 04/26/22  1738      History   Chief Complaint Chief Complaint  Patient presents with   Abdominal Pain    RLQ   Dysuria    HPI Grace Shepard is a 56 y.o. female.   Patient presents with lower abdominal pressure, dysuria, urinary frequency and urgency for 4 days.  Has began to have some mild low back pain beginning today.  Has not attempted treatment.  Denies fever, chills, vaginal symptoms.  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   Hyperlipidemia    Hypothyroidism    Ischemic colitis ([place])    Migraine    Thyroid disease    UTI (lower urinary tract infection)     Patient Active Problem List   Diagnosis Date Noted   Lichen sclerosus 40/05/2724   Class 1 obesity due to excess calories with body mass index (BMI) of 30.0 to 30.9 in adult 02/19/2021   Eczema 12/11/2020   Postmenopausal atrophic vaginitis 12/11/2020   Other spondylosis, sacral and sacrococcygeal region 02/08/2020   Enthesopathy of hip region (Left) 02/08/2020   Greater trochanteric bursitis of hips (Bilateral) 02/08/2020   Chronic sacroiliac joint pain (Right) 02/07/2020   Sacroiliac joint dysfunction (Bilateral) (R>L) 02/07/2020   Trochanteric bursitis of hip (Bilateral) 02/07/2020   Chronic hip pain (Left) 02/07/2020   Trochanteric bursitis, right hip 02/07/2020   Chronic pain syndrome 01/17/2020   Chronic lower extremity pain (2ry area of Pain) (Bilateral) (R>L) 01/17/2020   Chronic hip pain (Bilateral) (R>L) 01/17/2020   Chronic upper extremity pain (Bilateral) 01/17/2020   Cervicalgia (Right) 01/17/2020   Chronic occipital headache (Right) 01/17/2020   Cervicogenic headache (Right) 01/17/2020   Occipital  neuralgia (lesser occipital nerve) (Right) 01/17/2020   Cervical facet syndrome (Right) 01/17/2020   Cervico-occipital neuralgia (Right) 01/17/2020   Chronic sacroiliac joint pain (Bilateral) (R>L) 01/17/2020   Lumbar facet syndrome (Bilateral) (R>L) 01/17/2020   HLD (hyperlipidemia) 03/04/2019   Anxiety and depression 05/20/2016   GERD (gastroesophageal reflux disease) 06/15/2015   Chronic low back pain (1ry area of Pain) (Bilateral) (R>L) w/o sciatica 06/02/2015   Insomnia 05/22/2015   Fibromyalgia 04/21/2015   Migraines 04/21/2015   Acquired hypothyroidism 12/19/2014    Past Surgical History:  Procedure Laterality Date   CHOLECYSTECTOMY N/A 02/08/2022   Procedure: LAPAROSCOPIC CHOLECYSTECTOMY WITH INTRAOPERATIVE CHOLANGIOGRAM;  Surgeon: Robert Bellow, MD;  Location: [facility] ORS;  Service: General;  Laterality: N/A;  Floyce Stakes, RNFA to assist   TUBAL LIGATION      OB History   No obstetric history on file.      Home Medications    Prior to Admission medications   Medication Sig Start Date End Date Taking? Authorizing Provider  buPROPion (WELLBUTRIN XL) 300 MG 24 hr tablet Take 1 tablet (300 mg total) by mouth daily. 01/18/22  Yes [name], NP  cetirizine (ZYRTEC) 10 MG tablet Take 10 mg by mouth daily.   Yes [provider]  Cholecalciferol (VITAMIN D3) 125 MCG (5000 UT) CAPS Take 4,000 Units by mouth.   Yes [provider]  DULoxetine (CYMBALTA) 20 MG capsule STOP paxil. Then  start cymbalta '20mg'$  once daily. 04/16/22  Yes [provider]  levothyroxine (SYNTHROID) 75 MCG tablet Take 1 tablet (75 mcg total) by mouth daily. 01/18/22  Yes [name], NP  montelukast (SINGULAIR) 10 MG tablet TAKE 1 TABLET AT BEDTIME 06/11/21  Yes Baity, Coralie Keens, NP  pantoprazole (PROTONIX) 40 MG tablet Take 1 tablet (40 mg total) by mouth daily. 02/18/18  Yes Baity, Coralie Keens, NP  pregabalin (LYRICA) 75 MG capsule Take 1 capsule (75 mg total) by mouth 2  (two) times daily. 12/11/20  Yes [name], NP  topiramate (TOPAMAX) 100 MG tablet Take 100 mg by mouth daily. 04/13/21  Yes [provider]  traZODone (DESYREL) 50 MG tablet Take 1-2 tablets (50-100 mg total) by mouth at bedtime as needed for sleep. 01/18/22  Yes Baity, Coralie Keens, NP  vitamin B-12 (CYANOCOBALAMIN) 1000 MCG tablet Take 50 mcg by mouth every other day.    Yes [provider]  vitamin E 180 MG (400 UNITS) capsule Take 400 Units by mouth daily.   Yes [provider]  albuterol (VENTOLIN HFA) 108 (90 Base) MCG/ACT inhaler USE 1 INHALATION EVERY 4 HOURS AS NEEDED FOR WHEEZING OR SHORTNESS OF BREATH Patient not taking: Reported on 02/08/2022 11/23/21   [name], NP  baclofen (LIORESAL) 10 MG tablet Take 1 tablet (10 mg total) by mouth 3 (three) times daily as needed for muscle spasms. 01/18/22   [name], NP  clobetasol cream (TEMOVATE) 1.61 % Apply 1 application topically 2 (two) times daily. 09/24/21   [name], NP  estradiol (ESTRACE) 0.1 MG/GM vaginal cream Place 1 Applicatorful vaginally 3 (three) times a week. 05/25/21   [name], NP  HYDROcodone-acetaminophen (NORCO/VICODIN) 5-325 MG tablet Take 1 tablet by mouth every 4 (four) hours as needed for moderate pain. 02/08/22 02/08/23  Robert Bellow, MD  ipratropium (ATROVENT) 0.06 % nasal spray Place 2 sprays into both nostrils 4 (four) times daily. For up to 5-7 days then stop. Patient not taking: Reported on 02/08/2022 12/06/21   [name], NP  [place]    [provider]  OVER THE COUNTER MEDICATION Kyolic (aged garlic    [provider]  triamcinolone cream (KENALOG) 0.1 % APPLY 1 APPLICATION TOPICALLY TWICE A DAY AS NEEDED. Flo Shanks 06/12/21   [name], NP    Family History Family History  Problem Relation Age of Onset   Drug abuse Sister    Alcohol abuse Sister    Depression Sister    Rheum  arthritis Father    Congestive Heart Failure Father    Depression Father    Diabetes Father    Hearing loss Father    Osteoarthritis Mother    Stroke Mother    Aneurysm Mother    Breast cancer Maternal Aunt    Heart attack Maternal Grandfather    Diabetes Brother    Alcohol abuse Paternal Uncle    Arthritis Sister    Depression Sister    Heart disease Sister    Crohn's disease Sister     Social History Social History   Tobacco Use   Smoking status: Never   Smokeless tobacco: Never  Vaping Use   Vaping Use: Never used  Substance Use Topics   Alcohol use: No   Drug use: No     Allergies   Bactrim [sulfamethoxazole-trimethoprim], Gabapentin, and Tramadol   Review of Systems Review of Systems  Constitutional: Negative.   Respiratory: Negative.    Cardiovascular: Negative.   Gastrointestinal: Negative.   Genitourinary:  Positive for dysuria, frequency, pelvic pain and urgency. Negative for decreased urine volume, difficulty urinating, dyspareunia, enuresis, flank pain, genital sores, hematuria, menstrual problem, vaginal bleeding, vaginal discharge and vaginal pain.     Physical Exam Triage Vital Signs ED Triage Vitals  Enc Vitals Group     BP 04/26/22 1756 133/85     Pulse Rate 04/26/22 1756 86     Resp 04/26/22 1756 14     Temp 04/26/22 1756 98.3 F (36.8 C)     Temp Source 04/26/22 1756 Oral     SpO2 04/26/22 1756 96 %     Weight 04/26/22 1753 132 lb (59.9 kg)     Height 04/26/22 1753 '4\' 10"'$  (1.473 m)     Head Circumference --      Peak Flow --      Pain Score 04/26/22 1753 2     Pain Loc --      Pain Edu? --      Excl. in [place]? --    No data found.  Updated Vital Signs BP 133/85 (BP Location: Right Arm)   Pulse 86   Temp 98.3 F (36.8 C) (Oral)   Resp 14   Ht '4\' 10"'$  (1.473 m)   Wt 132 lb (59.9 kg)   LMP 12/12/2014 (Approximate)   SpO2 96%   BMI 27.59 kg/m   Visual Acuity Right Eye Distance:   Left Eye Distance:   Bilateral Distance:     Right Eye Near:   Left Eye Near:    Bilateral Near:     Physical Exam Constitutional:      Appearance: Normal appearance. She is well-developed.  HENT:     Head: Normocephalic.  Eyes:     Extraocular Movements: Extraocular movements intact.  Pulmonary:     Effort: Pulmonary effort is normal.  Abdominal:     General: Abdomen is flat. Bowel sounds are normal.     Palpations: Abdomen is soft.     Tenderness: There is abdominal tenderness in the suprapubic area. There is no right CVA tenderness or left CVA tenderness.  Neurological:     Mental Status: She is alert and oriented to person, place, and time.      UC Treatments / Results  Labs (all labs ordered are listed, but only abnormal results are displayed) Labs Reviewed  WET PREP, GENITAL  URINALYSIS, ROUTINE W REFLEX MICROSCOPIC    EKG   Radiology No results found.  Procedures Procedures (including critical care time)  Medications Ordered in UC Medications - No data to display  Initial Impression / Assessment and Plan / UC Course  I have reviewed the triage vital signs and the nursing notes.  Pertinent labs & imaging results that were available during my care of the patient were reviewed by me and considered in my medical decision making (see chart for details).  Dysuria, suprapubic abdominal pain  There is tenderness to the suprapubic region on exam however urinalysis and wet prep are negative, due to patient asymptomatic urine has been sent for culture, prescribed Pyridium for supportive measures and recommended increase fluid intake and good hygiene additionally, if all testing is negative and symptoms continue to persist recommended follow-up for retesting with PCP or urgent care Final Clinical Impressions(s) / UC Diagnoses   Final diagnoses:  None   Discharge Instructions   None    ED Prescriptions  None    PDMP not reviewed this encounter.   Hans [place], [place] 04/26/22 [phone]

## 2022-04-27 LAB — URINE CULTURE: Culture: NO GROWTH

## 2022-05-21 ENCOUNTER — Encounter

## 2022-07-15 ENCOUNTER — Other Ambulatory Visit: Payer: Self-pay | Admitting: Internal Medicine

## 2022-07-15 DIAGNOSIS — Z1231 Encounter for screening mammogram for malignant neoplasm of breast: Secondary | ICD-10-CM

## 2022-07-25 IMAGING — US US ABDOMEN LIMITED
1 series · 14 of 25 positions shown · non-contrast
Comparison: 05/28/2015 CT

CLINICAL DATA: Epigastric abdominal pain.

EXAM:
ULTRASOUND ABDOMEN LIMITED RIGHT UPPER QUADRANT

[Series 1: us abdomen limited · 0.22mm/px · 14 of 68 slices shown]
[im 1/68]
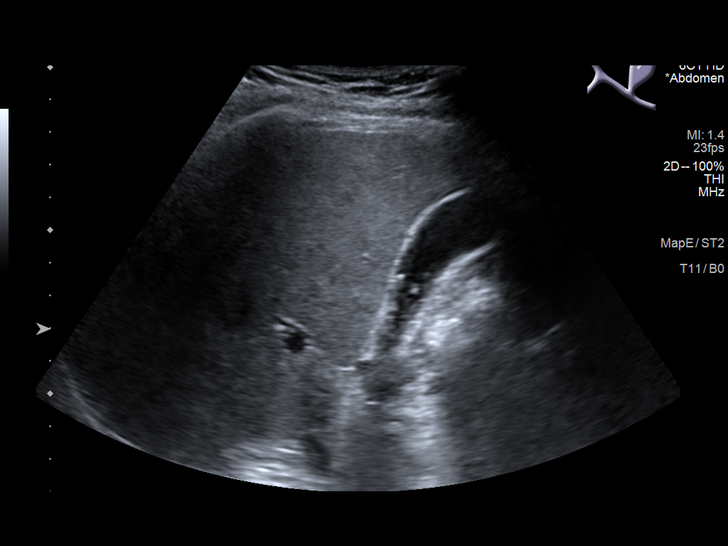
[im 6/68]
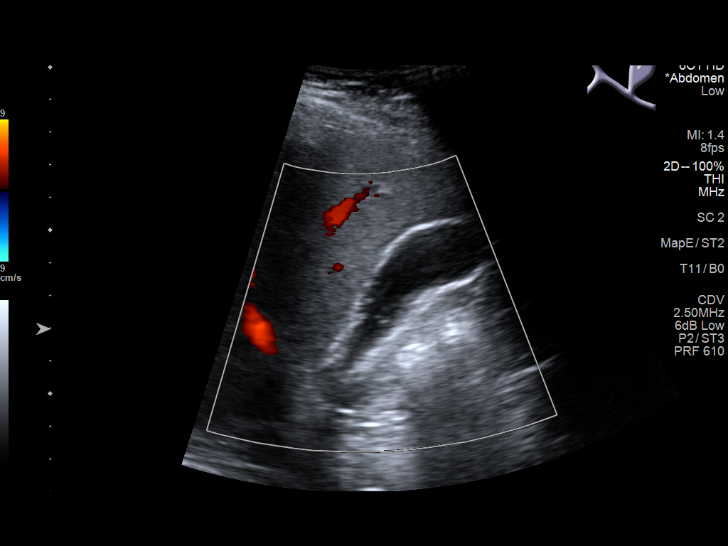
[im 12/68]
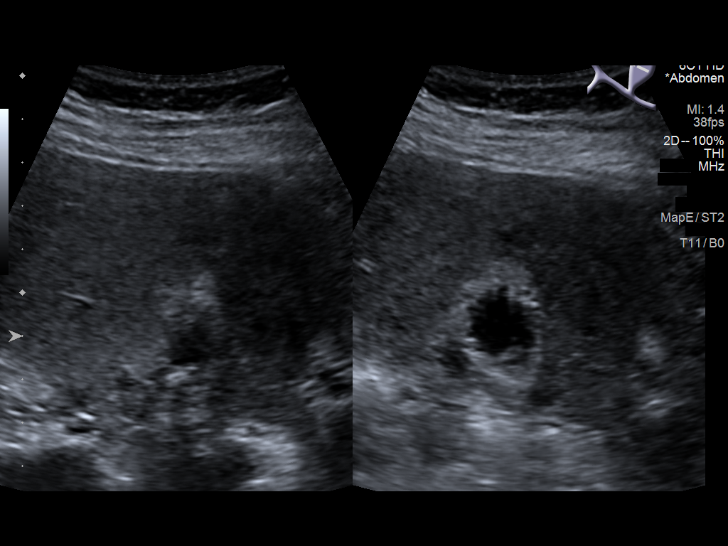
[im 17/68]
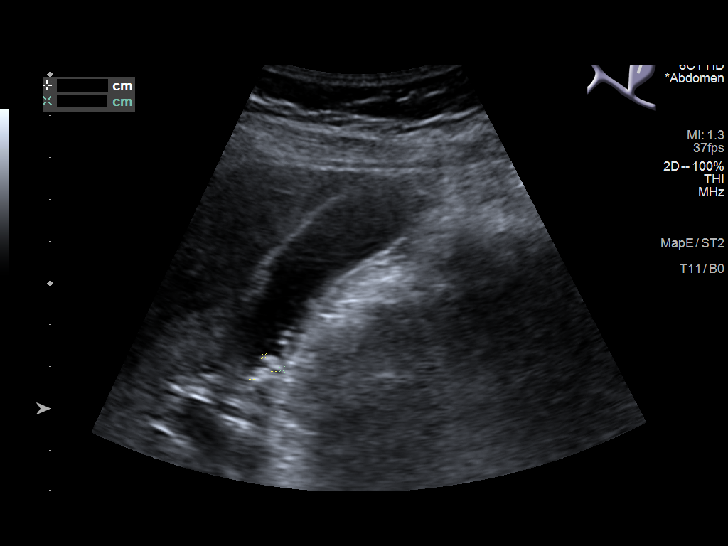
[im 23/68]
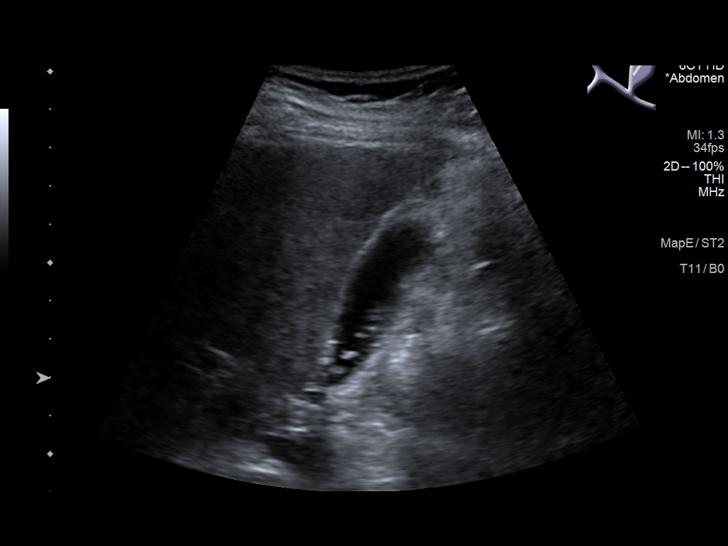
[im 26/68]
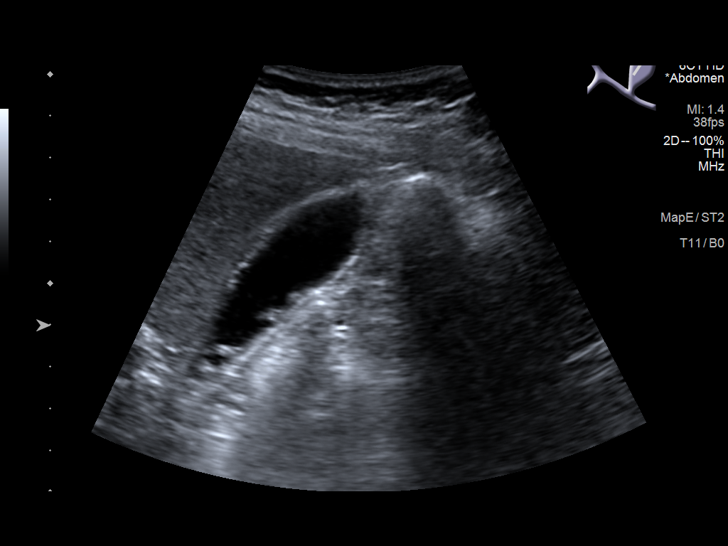
[im 31/68]
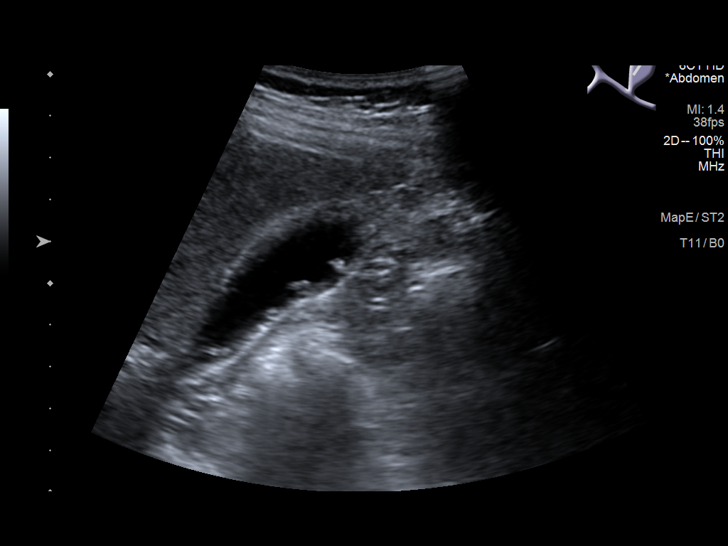
[im 37/68]
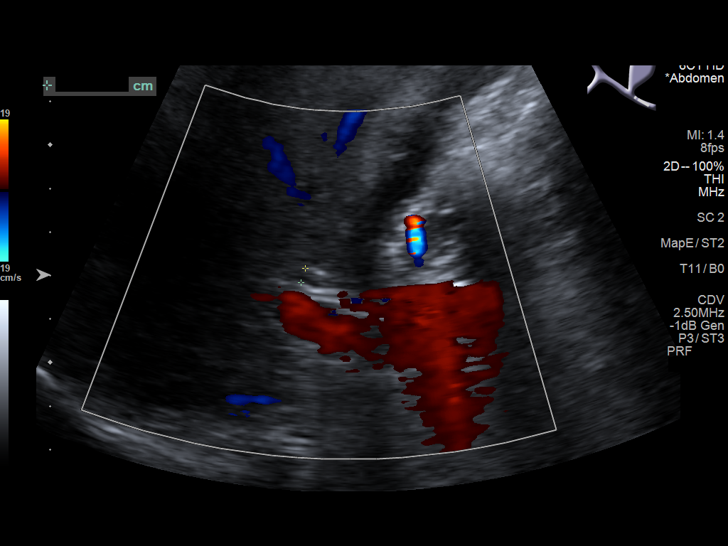
[im 42/68]
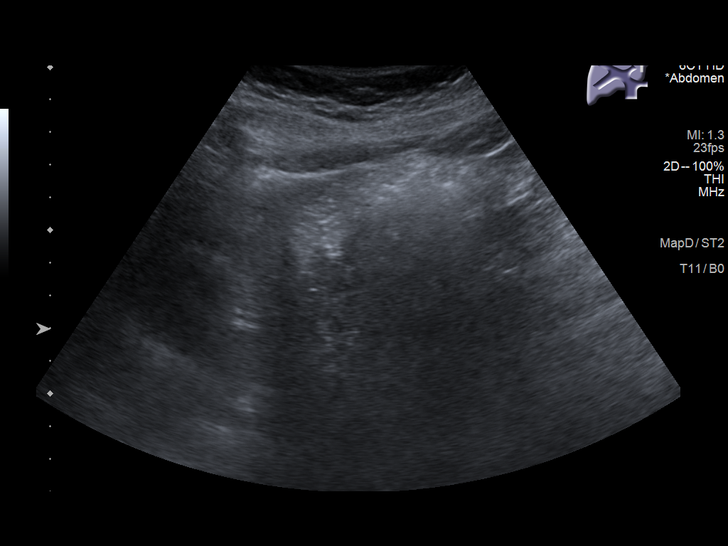
[im 45/68]
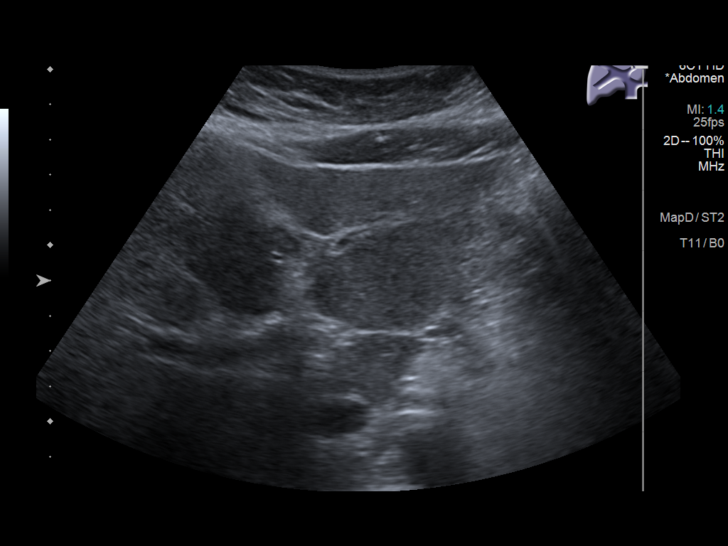
[im 51/68]
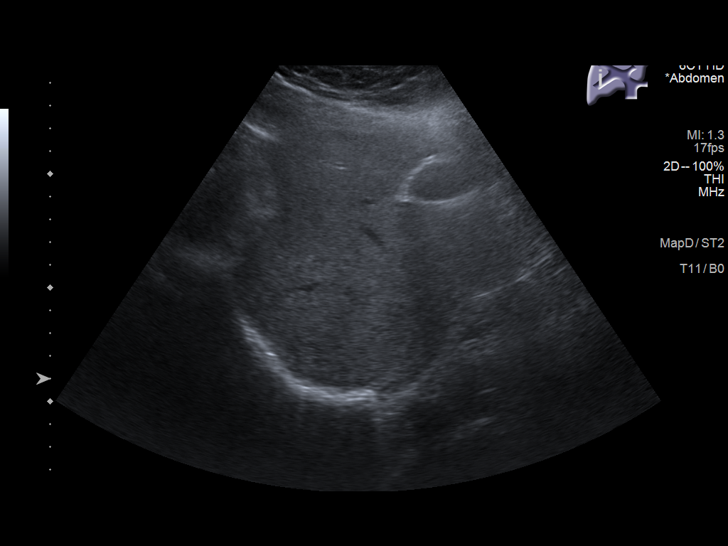
[im 56/68]
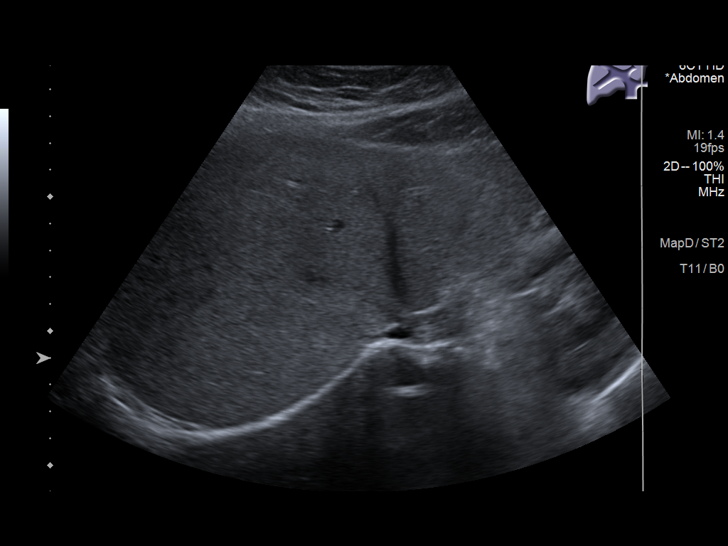
[im 62/68]
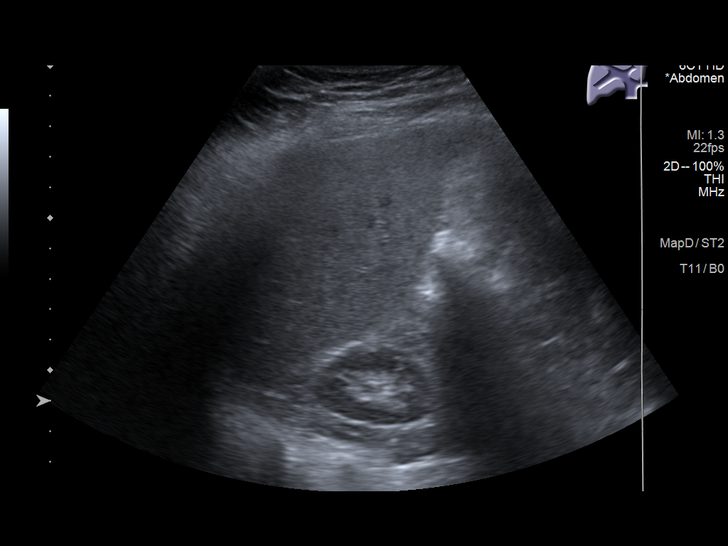
[im 68/68]
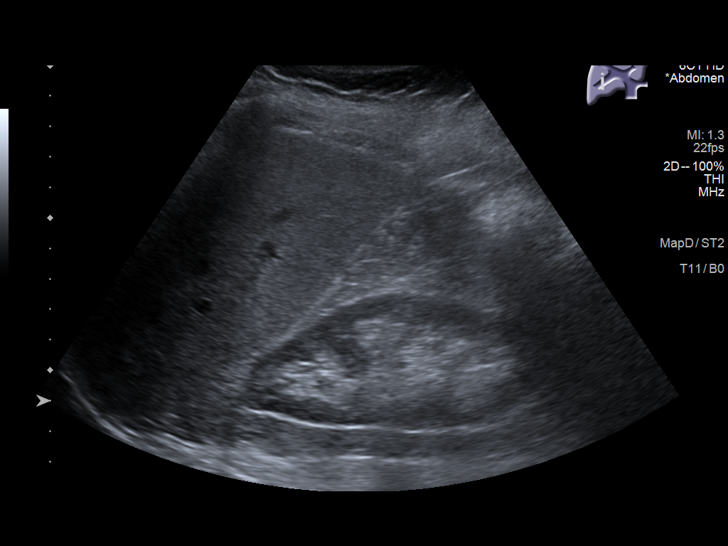

[14 of 25 positions shown; findings below may reference images not displayed]

FINDINGS: Gallbladder:

Gallstones of up to 7 mm. No pericholecystic fluid, wall thickening.
Sonographic Murphy's sign was not elicited. Subtle comet tail
artifact within the gallbladder fundus including on image 14.

Common bile duct:

Diameter: Normal, 3 mm.

Liver:

Mildly increased hepatic echogenicity. Portal vein is patent on
color Doppler imaging with normal direction of blood flow towards
the liver.

Other: None.
IMPRESSION: 1. Cholelithiasis without acute cholecystitis or biliary duct
dilatation.
2. Gallbladder adenomyomatosis.
3.  Increased hepatic echogenicity, favoring steatosis.

## 2022-09-04 ENCOUNTER — Ambulatory Visit (INDEPENDENT_AMBULATORY_CARE_PROVIDER_SITE_OTHER): Admitting: Dermatology

## 2022-09-04 VITALS — BP 109/71 | HR 71

## 2022-09-04 DIAGNOSIS — L309 Dermatitis, unspecified: Secondary | ICD-10-CM | POA: Diagnosis not present

## 2022-09-04 DIAGNOSIS — L649 Androgenic alopecia, unspecified: Secondary | ICD-10-CM

## 2022-09-04 DIAGNOSIS — L988 Other specified disorders of the skin and subcutaneous tissue: Secondary | ICD-10-CM | POA: Diagnosis not present

## 2022-09-04 MED ORDER — CLOBETASOL PROPIONATE 0.05 % EX CREA: TOPICAL_CREAM | CUTANEOUS | 1 refills | Status: DC

## 2022-09-04 MED ORDER — FINASTERIDE 5 MG PO TABS: 5.0000 mg | ORAL_TABLET | Freq: Every day | ORAL | 2 refills | Status: DC

## 2022-09-04 MED ORDER — MINOXIDIL 2.5 MG PO TABS: 2.5000 mg | ORAL_TABLET | Freq: Every day | ORAL | 2 refills | Status: DC

## 2022-09-04 MED ORDER — ALTRENO 0.05 % EX LOTN: TOPICAL_LOTION | CUTANEOUS | 5 refills | Status: DC

## 2022-09-04 NOTE — Progress Notes (Signed)
Follow-Up Visit   Subjective  Grace Shepard is a 57 y.o. female who presents for the following: Facial Elastosis (Patient presents for Rx refill, Altreno.) and Cracking (Thumb, has tried moisturizers but can't get area to heal.). She also has hair thinning over the past year. No family history of thinning hair.  No recent illnesses, surgeries, or increased stress.   The following portions of the chart were reviewed this encounter and updated as appropriate:       Review of Systems:  No other skin or systemic complaints except as noted in HPI or Assessment and Plan.  Objective  Well appearing patient in no apparent distress; mood and affect are within normal limits.  A focused examination was performed including face, hands. Relevant physical exam findings are noted in the Assessment and Plan.  hands, fingers Mild erythema and hyperkeratosis on the tip of R thumb and R index fingertip, with fissures.  face Rhytides and volume loss.   Scalp Diffuse thinning of the crown and slight widening of the midline part with retention of the frontal hairline - Reviewed progressive nature and prognosis. L>R temporal recession             Assessment & Plan  Hand dermatitis hands, fingers  Chronic and persistent condition with duration or expected duration over one year. Condition is bothersome/symptomatic for patient. Currently flared.   Hand Dermatitis is a chronic type of eczema that can come and go on the hands and fingers.  While there is no cure, the rash and symptoms can be managed with topical prescription medications, and for more severe cases, with systemic medications.  Recommend mild soap and routine use of moisturizing cream after handwashing.  Minimize soap/water exposure when possible.  Wear gloves when cleaning with chemicals. Sample of Neutrogena Gel Cream, Cetaphil Healing Ointment, CeraVe Healing Ointment.  Start clobetasol cream Apply to AA hands/fingers once to  twice daily until improved, avoid face, groin, axilla. Dsp 30g 1Rf. Topical steroids (such as triamcinolone, fluocinolone, fluocinonide, mometasone, clobetasol, halobetasol, betamethasone, hydrocortisone) can cause thinning and lightening of the skin if they are used for too long in the same area. Your physician has selected the right strength medicine for your problem and area affected on the body. Please use your medication only as directed by your physician to prevent side effects.       clobetasol cream (TEMOVATE) 0.05 % - hands, fingers Apply to affected areas hands, fingers twice daily until improved. Avoid face, groin, underarms.  Elastosis of skin face  Continue Altreno Lotion Apply to face QHS as tolerated dsp 45g 5Rf. Rx sent to [facility]. Topical retinoid medications like tretinoin/Retin-A, adapalene/Differin, tazarotene/Fabior, and Epiduo/Epiduo Forte can cause dryness and irritation when first started. Only apply a pea-sized amount to the entire affected area. Avoid applying it around the eyes, edges of mouth and creases at the nose. If you experience irritation, use a good moisturizer first and/or apply the medicine less often. If you are doing well with the medicine, you can increase how often you use it until you are applying every night. Be careful with sun protection while using this medication as it can make you sensitive to the sun. This medicine should not be used by pregnant women.   Recommend daily broad spectrum sunscreen SPF 30+ to sun-exposed areas, reapply every 2 hours as needed. Call for new or changing lesions.  Staying in the shade or wearing long sleeves, sun glasses (UVA+UVB protection) and wide brim hats (4-inch brim  around the entire circumference of the hat) are also recommended for sun protection.     Tretinoin (ALTRENO) 0.05 % LOTN - face Apply to face every night as tolerated.  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 Condition is symptomatic/ bothersome to patient. Not currently at goal.   Female Androgenic Alopecia is a chronic condition related to genetics and/or hormonal changes.  In women androgenetic alopecia is commonly associated with menopause but may occur any time after puberty.  It causes hair thinning primarily on the crown with widening of the part and temporal hairline recession.  Can use OTC Rogaine (minoxidil) 5% solution/foam as directed.  Oral treatments in female patients who have no contraindication may include : - Low dose oral minoxidil 1.25 - '5mg'$  daily - Spironolactone 50 - '100mg'$  bid - Finasteride 2.5 - 5 mg daily Adjunctive therapies include: - Low Level Laser Light Therapy (LLLT) - Platelet-rich plasma injections (PRP) - Hair Transplants or scalp reduction  CBC, CMP, TSH reviewed from 04/25/2022, wnl BP 109/71  Start finasteride 5 MG take 1/2 tablet po QD dsp #30 (Rx written 1 po QD)  Start minoxidil 2.5 MG take 1/2 tablet po QD dsp #30 (Rx written 1 po QD) Doses of minoxidil for hair loss are considered 'low dose'. This is because the doses used for hair loss are much lower than the doses which are used for conditions such as high blood pressure (hypertension). The doses used for hypertension are 10-'40mg'$  per day.  Side effects are uncommon at the low doses (up to 2.5 mg/day) used to treat hair loss. Potential side effects, more commonly seen at higher doses, include: Increase in hair growth (hypertrichosis) elsewhere on face and body Temporary hair shedding upon starting medication which may last up to 4 weeks Ankle swelling, fluid retention, rapid weight gain more than 5 pounds Low blood pressure and feeling lightheaded or dizzy when standing up quickly Fast or irregular heartbeat Headaches   finasteride (PROSCAR) 5 MG tablet - Scalp Take 1 tablet (5 mg total) by mouth daily.  minoxidil (LONITEN) 2.5 MG tablet - Scalp Take 1 tablet (2.5  mg total) by mouth daily.   Return in about 3 months (around 12/04/2022) for alopecia.  IJamesetta Orleans, CMA, am acting as scribe for Brendolyn Patty, MD .  Documentation: I have reviewed the above documentation for accuracy and completeness, and I agree with the above.  Brendolyn Patty MD

## 2022-09-04 NOTE — Patient Instructions (Addendum)
Female Androgenic Alopecia is a chronic condition related to genetics and/or hormonal changes.  In women androgenetic alopecia is commonly associated with menopause but may occur any time after puberty.  It causes hair thinning primarily on the crown with widening of the part and temporal hairline recession.  Can use OTC Rogaine (minoxidil) 5% solution/foam as directed.  Oral treatments in female patients who have no contraindication may include : - Low dose oral minoxidil 1.25 - '5mg'$  daily - Spironolactone 50 - '100mg'$  bid - Finasteride 2.5 - 5 mg daily Adjunctive therapies include: - Low Level Laser Light Therapy (LLLT) - Platelet-rich plasma injections (PRP) - Hair Transplants or scalp reduction  Start finasteride 5 MG take 1/2 tablet by mouth daily. Start minoxidil 2.5 MG take 1/2 tablet by mouth daily. Doses of minoxidil for hair loss are considered 'low dose'. This is because the doses used for hair loss are much lower than the doses which are used for conditions such as high blood pressure (hypertension). The doses used for hypertension are 10-'40mg'$  per day.  Side effects are uncommon at the low doses (up to 2.5 mg/day) used to treat hair loss. Potential side effects, more commonly seen at higher doses, include: Increase in hair growth (hypertrichosis) elsewhere on face and body Temporary hair shedding upon starting medication which may last up to 4 weeks Ankle swelling, fluid retention, rapid weight gain more than 5 pounds Low blood pressure and feeling lightheaded or dizzy when standing up quickly Fast or irregular heartbeat Headaches  Recommend minoxidil 5% (Rogaine for men) solution or foam to be applied to the scalp and left in. This should ideally be used twice daily for best results but it helps with hair regrowth when used at least three times per week. Rogaine initially can cause increased hair shedding for the first few weeks but this will stop with continued use. In studies, people  who used minoxidil (Rogaine) for at least 6 months had thicker hair than people who did not. Minoxidil topical (Rogaine) only works as long as it continues to be used. If if it is no longer used then the hair it has been helping to regrow can fall out. Minoxidil topical (Rogaine) can cause increased facial hair growth which can usually be managed easily with a battery-operated hair trimmer. If facial hair growth is bothersome, switching to the 2% women's version can decrease the risk of unwanted facial hair growth.   Due to recent changes in healthcare laws, you may see results of your pathology and/or laboratory studies on MyChart before the doctors have had a chance to review them. We understand that in some cases there may be results that are confusing or concerning to you. Please understand that not all results are received at the same time and often the doctors may need to interpret multiple results in order to provide you with the best plan of care or course of treatment. Therefore, we ask that you please give [place] 2 business days to thoroughly review all your results before contacting the office for clarification. Should we see a critical lab result, you will be contacted sooner.   If You Need Anything After Your Visit  If you have any questions or concerns for your doctor, please call our main line at [phone] and press option 4 to reach your doctor's medical assistant. If no one answers, please leave a voicemail as directed and we will return your call as soon as possible. Messages left after 4 pm will be answered  the following business day.   You may also send [place] a message via [place]. We typically respond to MyChart messages within 1-2 business days.  For prescription refills, please ask your pharmacy to contact our office. Our fax number is [phone].  If you have an urgent issue when the clinic is closed that cannot wait until the next business day, you can page your doctor at the  number below.    Please note that while we do our best to be available for urgent issues outside of office hours, we are not available 24/7.   If you have an urgent issue and are unable to reach [place], you may choose to seek medical care at your doctor's office, retail clinic, urgent care center, or emergency room.  If you have a medical emergency, please immediately call 911 or go to the emergency department.  Pager Numbers  - Dr. Nehemiah Massed: [phone]  - Dr. Laurence Ferrari: [phone]  - Dr. Nicole Kindred: [phone]  In the event of inclement weather, please call our main line at [phone] for an update on the status of any delays or closures.  Dermatology Medication Tips: Please keep the boxes that topical medications come in in order to help keep track of the instructions about where and how to use these. Pharmacies typically print the medication instructions only on the boxes and not directly on the medication tubes.   If your medication is too expensive, please contact our office at [phone] option 4 or send [place] a message through [place].   We are unable to tell what your co-pay for medications will be in advance as this is different depending on your insurance coverage. However, we may be able to find a substitute medication at lower cost or fill out paperwork to get insurance to cover a needed medication.   If a prior authorization is required to get your medication covered by your insurance company, please allow [place] 1-2 business days to complete this process.  Drug prices often vary depending on where the prescription is filled and some pharmacies may offer cheaper prices.  The website www.goodrx.com contains coupons for medications through different pharmacies. The prices here do not account for what the cost may be with help from insurance (it may be cheaper with your insurance), but the website can give you the price if you did not use any insurance.  - You can print the associated  coupon and take it with your prescription to the pharmacy.  - You may also stop by our office during regular business hours and pick up a GoodRx coupon card.  - If you need your prescription sent electronically to a different pharmacy, notify our office through [facility] or by phone at [phone] option 4.     Si Usted Necesita Algo Despus de Su Visita  Tambin puede enviarnos un mensaje a travs de Pharmacist, community. Por lo general respondemos a los mensajes de MyChart en el transcurso de 1 a 2 das hbiles.  Para renovar recetas, por favor pida a su farmacia que se ponga en contacto con nuestra oficina. Harland Dingwall de fax es [place] [phone].  Si tiene un asunto urgente cuando la clnica est cerrada y que no puede esperar hasta el siguiente da hbil, puede llamar/localizar a su doctor(a) al nmero que aparece a continuacin.   Por favor, tenga en cuenta que aunque hacemos todo lo posible para estar disponibles para asuntos urgentes fuera del horario de [place], no estamos disponibles las 24 horas del da, los  7 das de [employer].   Si tiene un problema urgente y no puede comunicarse con nosotros, puede optar por buscar atencin mdica  en el consultorio de su doctor(a), en una clnica privada, en un centro de atencin urgente o en una sala de emergencias.  Si tiene Engineering geologist, por favor llame inmediatamente al 911 o vaya a la sala de emergencias.  Nmeros de bper  - Dr. Nehemiah Massed: [phone]  - Dra. Moye: [phone]  - Dra. Nicole Kindred: [phone]  En caso de inclemencias del [place], por favor llame a Johnsie Kindred principal al [phone] para una actualizacin sobre el [place] de cualquier retraso o cierre.  Consejos para la medicacin en dermatologa: Por favor, guarde las cajas en las que vienen los medicamentos de uso tpico para ayudarle a seguir las instrucciones sobre dnde y cmo usarlos. Las farmacias generalmente imprimen las instrucciones del  medicamento slo en las cajas y no directamente en los tubos del [place].   Si su medicamento es muy caro, por favor, pngase en contacto con Zigmund Daniel llamando al [phone] y presione la opcin 4 o envenos un mensaje a travs de Pharmacist, community.   No podemos decirle cul ser su copago por los medicamentos por adelantado ya que esto es diferente dependiendo de la cobertura de su seguro. Sin embargo, es posible que podamos encontrar un medicamento sustituto a Electrical engineer un formulario para que el seguro cubra el medicamento que se considera necesario.   Si se requiere una autorizacin previa para que su compaa de seguros [place] su medicamento, por favor permtanos de 1 a 2 das hbiles para completar este proceso.  Los precios de los medicamentos varan con frecuencia dependiendo del Environmental consultant de dnde se surte la receta y alguna farmacias pueden ofrecer precios ms baratos.  El sitio web www.goodrx.com tiene cupones para medicamentos de Airline pilot. Los precios aqu no tienen en cuenta lo que podra costar con la ayuda del seguro (puede ser ms barato con su seguro), pero el sitio web puede darle el precio si no utiliz Research scientist (physical sciences).  - Puede imprimir el cupn correspondiente y llevarlo con su receta a la farmacia.  - Tambin puede pasar por nuestra oficina durante el horario de atencin regular y Charity fundraiser una tarjeta de cupones de GoodRx.  - Si necesita que su receta se enve electrnicamente a una farmacia diferente, informe a nuestra oficina a travs de MyChart de [place] o por telfono llamando al [phone] y presione la opcin 4.

## 2022-09-06 ENCOUNTER — Encounter

## 2022-09-06 DIAGNOSIS — Z1231 Encounter for screening mammogram for malignant neoplasm of breast: Secondary | ICD-10-CM

## 2022-10-10 ENCOUNTER — Encounter

## 2022-10-14 ENCOUNTER — Other Ambulatory Visit: Payer: Self-pay

## 2022-10-14 DIAGNOSIS — L649 Androgenic alopecia, unspecified: Secondary | ICD-10-CM

## 2022-10-14 MED ORDER — FINASTERIDE 5 MG PO TABS: 5.0000 mg | ORAL_TABLET | Freq: Every day | ORAL | 0 refills | Status: DC

## 2022-11-05 NOTE — Progress Notes (Unsigned)
PROVIDER NOTE: Information contained herein reflects review and annotations entered in association with encounter. Interpretation of such information and data should be left to medically-trained personnel. Information provided to patient can be located elsewhere in the medical record under "Patient Instructions". Document created using STT-dictation technology, any transcriptional errors that may result from process are unintentional.    Patient: Grace Shepard  Service Category: E/M  Provider: Gaspar Cola, MD  DOB: [birth year]  DOS: 11/06/2022  Referring Provider: [name], NP  MRN: [id number]  Specialty: Interventional Pain Management  PCP: [name], NP  Type: Established Patient  Setting: Ambulatory outpatient    Location: Office  Delivery: Face-to-face     HPI  [name], a 57 y.o. year old female, is here today because of her Chronic right-sided low back pain without sciatica [M54.50, G89.29]. Ms. Maisano primary complain today is Back Pain (lower)  Pertinent problems: Ms. Hebner has Fibromyalgia; Migraines; Chronic low back pain (1ry area of Pain) (Bilateral) (R>L) w/o sciatica; Chronic pain syndrome; Chronic lower extremity pain (2ry area of Pain) (Bilateral) (R>L); Chronic hip pain (Bilateral) (R>L); Chronic upper extremity pain (Bilateral); Cervicalgia (Right); Chronic occipital headache (Right); Cervicogenic headache (Right); Occipital neuralgia (lesser occipital nerve) (Right); Cervical facet syndrome (Right); Cervico-occipital neuralgia (Right); Chronic sacroiliac joint pain (Bilateral) (R>L); Lumbar facet syndrome (Bilateral) (R>L); Chronic sacroiliac joint pain (Right); Sacroiliac joint dysfunction (Bilateral) (R>L); Trochanteric bursitis of hip (Bilateral); Chronic hip pain (Left); Other spondylosis, sacral and sacrococcygeal region; Enthesopathy of hip region (Left); Greater trochanteric bursitis of hips (Bilateral); Trochanteric bursitis, right hip; Chronic  low back pain (Right) w/o sciatica; Low back pain of over 3 months duration; Abnormal NCS (nerve conduction studies) (02/12/2016); and Spondylosis without myelopathy or radiculopathy, lumbosacral region on their pertinent problem list. Pain Assessment: Severity of Chronic pain is reported as a 2 /10. Location: Back Lower/denies. Onset: More than a month ago. Quality: Pressure. Timing: Constant. Modifying factor(s): repositioning. Vitals:  height is 4\' 10"  (1.473 m) and weight is 145 lb (65.8 kg). Her temporal temperature is 97.2 F (36.2 C) (abnormal). Her blood pressure is 115/70 and her pulse is 83. Her respiration is 16 and oxygen saturation is 100%.  BMI: Estimated body mass index is 30.31 kg/m as calculated from the following:   Height as of this encounter: 4\' 10"  (1.473 m).   Weight as of this encounter: 145 lb (65.8 kg). Last encounter: 07/12/2021. Last procedure: 06/28/2021.  (Right SI joint block #3 + right trochanteric bursa injection #2) (0/0/90/90)  Reason for encounter: evaluation of worsening, or previously known (established) problem.  The patient called requesting a follow-up appointment secondary to flareup of the right lower back pain.  This is a known problem which we have been treating since 01/17/2020.  The last time that we provided treatment for this condition was on 06/28/2021 (496 days ago).  Today the patient comes in indicating that she has pain primarily in the right lower back and that she waited until she could not tolerate it anymore.  Today I took the time to explain to the patient that he is probably not a good idea to wait so long once the pain starts since this can lead to more inflammation and subsequently make it harder for [place] to eliminate that pain.  She describes the pain to be in the right lower back and feels as if it radiates to the mid lateral line over the iliac crest.  She denies any pain, numbness, or weakness  of the lower extremities.  She indicates having  some pain on the left side, but not as much as that on the right.  Physical exam today was positive for bilateral lumbar facet arthralgia on hyperextension and rotation maneuver of the lumbar spine with the pain being ipsilateral to the side that she was turning into.  The results were concordant with the [facility] maneuver.  Provocative Patrick maneuver was negative bilaterally for sacroiliac joint arthralgia as well as hip joint arthralgia.  Distraction maneuver was negative bilaterally, compression maneuver was also negative bilaterally.  Thigh thrust maneuver was negative bilaterally.  Gaenslen's test was also negative bilaterally.  Straight leg raise was negative bilaterally for any radicular symptoms.  The patient was able to toe walk and heel walk but had difficulty with straightening the lumbar spine.  Attempting hyperextension maneuver of the lumbar spine again trigger pain primarily on the right side of the lower back.  Today's exam is positive for bilateral lower lumbar facet arthralgia/arthropathy with a right side being worse than the left.  Today I will send the patient to have an updated x-ray of the lumbar spine on flexion and extension.  In addition I will schedule the patient to return for a diagnostic/therapeutic bilateral lumbar facet block.  The plan is to use fluoroscopic guidance to map the area of the pain and determine the levels that are involved.  From the location of the pain it would seem that it is involving the lower lumbar levels.  We will confirm with the diagnostic x-ray and physiologically with the fluoroscopic mapping of the pain.  Should the patient continue to have problems, then we will look at the possibility of radiofrequency ablation.  Pharmacotherapy Assessment  Analgesic: None. No opioid analgesics prescribed by our practice. MME/day: 0 mg/day   Monitoring: [place] PMP: PDMP reviewed during this encounter.       Pharmacotherapy: No side-effects or adverse reactions  reported. Compliance: No problems identified. Effectiveness: Clinically acceptable.  No notes on file  No results found for: "CBDTHCR" No results found for: "D8THCCBX" No results found for: "D9THCCBX"  UDS:  No results found for: "SUMMARY"    ROS  Constitutional: Denies any fever or chills Gastrointestinal: No reported hemesis, hematochezia, vomiting, or acute GI distress Musculoskeletal: Denies any acute onset joint swelling, redness, loss of ROM, or weakness Neurological: No reported episodes of acute onset apraxia, aphasia, dysarthria, agnosia, amnesia, paralysis, loss of coordination, or loss of consciousness  Medication Review  Cholecalciferol, acetaminophen, albuterol, baclofen, cetirizine, clobetasol cream, cyanocobalamin, divalproex, estradiol, finasteride, levothyroxine, montelukast, pantoprazole, pregabalin, rosuvastatin, topiramate, traZODone, triamcinolone cream, and vitamin E  History Review  Allergy: Ms. Henne is allergic to bactrim [sulfamethoxazole-trimethoprim], gabapentin, and tramadol. Drug: Ms. Garrelts  reports no history of drug use. Alcohol:  reports no history of alcohol use. Tobacco:  reports that she has never smoked. She has never used smokeless tobacco. Social: Ms. Lautzenheiser  reports that she has never smoked. She has never used smokeless tobacco. She reports that she does not drink alcohol and does not use drugs. 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 Surgical: Ms. Morini  has a past surgical history that includes Tubal ligation and Cholecystectomy (N/A, 02/08/2022). Family: family history includes Alcohol abuse in her paternal uncle and sister; Aneurysm in her  mother; Arthritis in  her sister; Breast cancer in her maternal aunt; Congestive Heart Failure in her father; Crohn's disease in her sister; Depression in her father, sister, and sister; Diabetes in her brother and father; Drug abuse in her sister; Hearing loss in her father; Heart attack in her maternal grandfather; Heart disease in her sister; Osteoarthritis in her mother; Rheum arthritis in her father; Stroke in her mother.  Laboratory Chemistry Profile   Renal Lab Results  Component Value Date   BUN 13 05/24/2021   CREATININE 0.88 [id number]   BCR NOT APPLICABLE [id number]   GFR 64.09 04/11/2020   GFRAA >60 01/17/2020   GFRNONAA 58 (L) 01/17/2020    Hepatic Lab Results  Component Value Date   AST 17 05/24/2021   ALT 25 05/24/2021   ALBUMIN 4.5 04/11/2020   ALKPHOS 106 04/11/2020   LIPASE 24.0 06/15/2015    Electrolytes Lab Results  Component Value Date   NA 142 05/24/2021   K 3.6 05/24/2021   CL 110 05/24/2021   CALCIUM 9.1 05/24/2021   MG 2.2 01/17/2020    Bone Lab Results  Component Value Date   VD25OH 78 03/06/2021    Inflammation (CRP: Acute Phase) (ESR: Chronic Phase) Lab Results  Component Value Date   CRP 1.4 (H) 01/17/2020   ESRSEDRATE 9 01/17/2020         Note: Above Lab results reviewed.  Recent Imaging Review  DG Cholangiogram Operative CLINICAL DATA:  Cholelithiasis  EXAM: INTRAOPERATIVE CHOLANGIOGRAM  TECHNIQUE: Cholangiographic images from the C-arm fluoroscopic device were submitted for interpretation post-operatively. Please see the procedural report for the amount of contrast and the fluoroscopy time utilized.  COMPARISON:  Ultrasound 12/03/2021  FINDINGS: A series of intraoperative fluoroscopic images document opacification of gallbladder and cystic duct. There is a mobile filling defect in the gallbladder. No filling defects in the cystic duct or CBD. Contrast flows into the duodenum. The intrahepatic biliary tree is not  opacified.  IMPRESSION: Negative for choledocholithiasis or obstruction  Electronically Signed   By: Lucrezia Europe M.D.   On: 02/08/2022 08:38 Note: Reviewed        Physical Exam  General appearance: Well nourished, well developed, and well hydrated. In no apparent acute distress Mental status: Alert, oriented x 3 (person, place, & time)       Respiratory: No evidence of acute respiratory distress Eyes: PERLA Vitals: BP 115/70   Pulse 83   Temp (!) 97.2 F (36.2 C) (Temporal)   Resp 16   Ht 4\' 10"  (1.473 m)   Wt 145 lb (65.8 kg)   LMP 12/12/2014 (Approximate)   SpO2 100%   BMI 30.31 kg/m  BMI: Estimated body mass index is 30.31 kg/m as calculated from the following:   Height as of this encounter: 4\' 10"  (1.473 m).   Weight as of this encounter: 145 lb (65.8 kg). Ideal: Patient must be at least 60 in tall to calculate ideal body weight  Assessment   Diagnosis Status  1. Chronic low back pain (Right) w/o sciatica   2. Low back pain of over 3 months duration   3. Lumbar facet syndrome (Bilateral) (R>L)   4. Spondylosis without myelopathy or radiculopathy, lumbosacral region   5. Trochanteric bursitis of hip (Bilateral)   6. 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    Spondylosis Without Myelopathy Or Radiculopathy, Lumbosacral Region  Lichen Sclerosus   Formatting of this note might be different from the original. Last Assessment & Plan: Formatting of this note might be different from the original. Rx for Clobetasol  0.5% cream twice daily.     Plan of Care  Problem-specific:  No problem-specific Assessment & Plan notes found for this encounter.  Ms. LORIEN PARSLOW has a current medication list which includes the following long-term medication(s): albuterol, divalproex, levothyroxine, montelukast, pantoprazole, pregabalin, topiramate, and trazodone.  Pharmacotherapy (Medications Ordered): No orders of the defined types were placed in this encounter.  Orders:  Orders Placed This Encounter  Procedures   LUMBAR FACET(MEDIAL BRANCH NERVE BLOCK) MBNB    Diagnosis: Lumbar Facet Syndrome (M47.816); Lumbosacral Facet Syndrome (M47.817); Lumbar Facet Joint Pain (M54.59) Medical Necessity Statement: 1.Severe chronic axial low back pain causing functional impairment documented by ongoing pain scale assessments. 2.Pain present for longer than 3 months (Chronic) documented to have failed noninvasive conservative therapies. 3.Absence of untreated radiculopathy. 4.There is no radiological evidence of untreated fractures, tumor, infection, or deformity.  Physical Examination Findings: Positive Kemp Maneuver: (Y)  Positive Lumbar Hyperextension-Rotation provocative test: (Y)    Standing Status:   Future    Standing Expiration Date:   02/06/2023    Scheduling Instructions:     Procedure: Lumbar facet Block     Type: Medial Branch Block     Side: Bilateral     Purpose: Diagnostic Radiologic Mapping     Level(s): L3-4, L4-5, L5-S1, and TBD by Fluoroscopic Mapping Facets (L2, L3, L4, L5, S1, and TBD Medial Branch)     Sedation: With Sedation.     Timeframe: ASAP    Order Specific Question:   Where will this procedure be performed?    Answer:   [facility]   DG Lumbar Spine Complete W/Bend    Patient presents with axial pain with possible radicular component. Please assist [place] in identifying specific level(s) and laterality of any additional findings such as: 1. Facet (Zygapophyseal) joint DJD  (Hypertrophy, space narrowing, subchondral sclerosis, and/or osteophyte formation) 2. DDD and/or IVDD (Loss of disc height, desiccation, gas patterns, osteophytes, endplate sclerosis, or "Black disc disease") 3. Pars defects 4. Spondylolisthesis, spondylosis, and/or spondyloarthropathies (include Degree/Grade of displacement in mm) (stability) 5. Vertebral body Fractures (acute/chronic) (state percentage of collapse) 6. Demineralization (osteopenia/osteoporotic) 7. Bone pathology 8. Foraminal narrowing  9. Surgical changes    Standing Status:   Future    Standing Expiration Date:   12/07/2022    Scheduling Instructions:     Please make sure that the patient understands that this needs to be done as soon as possible. Never have the patient do the imaging "just before the next appointment". Inform patient that having the imaging done within the [facility] will expedite the availability of the results and will provide      imaging availability to the requesting physician. In addition inform the patient that the imaging order has an expiration date and will not be renewed if not done within the active period.    Order Specific Question:   Reason for Exam (SYMPTOM  OR DIAGNOSIS REQUIRED)    Answer:   Low back pain    Order Specific  Question:   Is patient pregnant?    Answer:   No    Order Specific Question:   Preferred imaging location?    Answer:   [facility]    Order Specific Question:   Call Results- Best Contact Number?    Answer:   [phone] ([facility])    Order Specific Question:   Radiology Contrast Protocol - do NOT remove file path    Answer:   [email or web address]    Order Specific Question:   Release to patient    Answer:   Immediate   Follow-up plan:   Return for (ECT): (B) L-FCT Blk #1.      Interventional Therapies  Risk  Complexity Considerations:   Estimated body mass index is 31.35 kg/m as calculated from the  following:   Height as of this encounter: 4\' 10"  (1.473 m).   Weight as of this encounter: 150 lb (68 kg). WNL   Planned  Pending:   Pending further evaluation   Under consideration:   Diagnostic right IA hip joint injections  Diagnostic right-sided cervical ESI  Diagnostic right-sided occipital NB  Possible right occipital nerve RFA  Diagnostic right C2/TON NB  Possible right C2/TON RFA    Completed:   Therapeutic right inferior SI joint Blk x3 (06/28/2021) (0/0/90/90)  Therapeutic left IA hip inj. x2 (11/21/2020) (100/100/50/RLEP:60  LHip:70)  Therapeutic right trochanteric bursa inj. x2 (06/28/2021) (0/0/90/90)  Therapeutic left trochanteric bursa inj. x2 (11/21/2020) (100/100/50/RLEP:60  LHip:70)    Therapeutic  Palliative (PRN) options:   Palliative SI joint Blk (lower approach)  Palliative trochanteric bursa inj.    Palliative IA hip inj.        Recent Visits No visits were found meeting these conditions. Showing recent visits within past 90 days and meeting all other requirements Today's Visits Date Type Provider Dept  11/06/22 Office Visit Milinda Pointer, MD [facility]  Showing today's visits and meeting all other requirements Future Appointments Date Type Provider Dept  11/21/22 Appointment Milinda Pointer, MD [facility]  Showing future appointments within next 90 days and meeting all other requirements  I discussed the assessment and treatment plan with the patient. The patient was provided an opportunity to ask questions and all were answered. The patient agreed with the plan and demonstrated an understanding of the instructions.  Patient advised to call back or seek an in-person evaluation if the symptoms or condition worsens.  Duration of encounter: 46 minutes.  Total time on encounter, as per AMA guidelines included both the face-to-face and non-face-to-face time personally spent by the physician and/or other qualified health  care professional(s) on the day of the encounter (includes time in activities that require the physician or other qualified health care professional and does not include time in activities normally performed by clinical staff). Physician's time may include the following activities when performed: Preparing to see the patient (e.g., pre-charting review of records, searching for previously ordered imaging, lab work, and nerve conduction tests) Review of prior analgesic pharmacotherapies. Reviewing PMP Interpreting ordered tests (e.g., lab work, imaging, nerve conduction tests) Performing post-procedure evaluations, including interpretation of diagnostic procedures Obtaining and/or reviewing separately obtained history Performing a medically appropriate examination and/or evaluation Counseling and educating the patient/family/caregiver Ordering medications, tests, or procedures Referring and communicating with other health care professionals (when not separately reported) Documenting clinical information in the electronic or other health record Independently interpreting results (not separately reported) and communicating results to the patient/ family/caregiver Care coordination (not separately  reported)  Note by: Gaspar Cola, MD Date: 11/06/2022; Time: 9:11 AM

## 2022-11-05 NOTE — Patient Instructions (Signed)

## 2022-11-06 ENCOUNTER — Encounter: Payer: Self-pay | Admitting: Pain Medicine

## 2022-11-06 ENCOUNTER — Ambulatory Visit
Admission: RE | Admit: 2022-11-06 | Discharge: 2022-11-06 | Disposition: A | Discharge status: Home / Self Care | Source: Ambulatory Visit | Attending: Pain Medicine | Admitting: Pain Medicine

## 2022-11-06 ENCOUNTER — Ambulatory Visit: Admitting: Pain Medicine

## 2022-11-06 VITALS — BP 115/70 | HR 83 | Temp 97.2°F | Resp 16 | Ht <= 58 in | Wt 145.0 lb

## 2022-11-06 DIAGNOSIS — M47898 Other spondylosis, sacral and sacrococcygeal region: Secondary | ICD-10-CM

## 2022-11-06 DIAGNOSIS — G8929 Other chronic pain: Secondary | ICD-10-CM | POA: Insufficient documentation

## 2022-11-06 DIAGNOSIS — M47817 Spondylosis without myelopathy or radiculopathy, lumbosacral region: Secondary | ICD-10-CM

## 2022-11-06 DIAGNOSIS — M47816 Spondylosis without myelopathy or radiculopathy, lumbar region: Secondary | ICD-10-CM | POA: Insufficient documentation

## 2022-11-06 DIAGNOSIS — M7061 Trochanteric bursitis, right hip: Secondary | ICD-10-CM

## 2022-11-06 DIAGNOSIS — R94130 Abnormal response to nerve stimulation, unspecified: Secondary | ICD-10-CM | POA: Insufficient documentation

## 2022-11-06 DIAGNOSIS — M545 Low back pain, unspecified: Secondary | ICD-10-CM

## 2022-11-06 DIAGNOSIS — M7062 Trochanteric bursitis, left hip: Secondary | ICD-10-CM

## 2022-11-07 ENCOUNTER — Encounter

## 2022-11-11 ENCOUNTER — Telehealth: Payer: Self-pay | Admitting: Pain Medicine

## 2022-11-11 NOTE — Telephone Encounter (Signed)
All questions answered

## 2022-11-11 NOTE — Telephone Encounter (Signed)
Scheduled for bilateral lumbar facet block. Attempted to call patient, message left.

## 2022-11-11 NOTE — Telephone Encounter (Signed)
Patient called back, asked that we call her back after 1330.

## 2022-11-11 NOTE — Telephone Encounter (Signed)
PT called stated that she is unsure about the proc that is scheduled for 11-21-22. PT stated that she isn't sure about the location, PT wanted to see if Dr. Dossie Arbour will allow her to have a VV appt to discuss this matter. Please give patient a call. TY

## 2022-11-20 NOTE — Progress Notes (Signed)
PROVIDER NOTE: Interpretation of information contained herein should be left to medically-trained personnel. Specific patient instructions are provided elsewhere under "Patient Instructions" section of medical record. This document was created in part using STT-dictation technology, any transcriptional errors that may result from this process are unintentional.  Patient: Grace Shepard Type: Established DOB: 03/02/1966 MRN: [id number] PCP: Lorre Munroe, NP  Service: Procedure DOS: 11/21/2022 Setting: Ambulatory Location: Ambulatory outpatient facility Delivery: Face-to-face Provider: Oswaldo Done, MD Specialty: Interventional Pain Management Specialty designation: 09 Location: Outpatient facility Ref. Prov.: Delano Metz, MD       Interventional Therapy   Procedure: Lumbar Facet, Medial Branch Block(s) #1  Laterality: Bilateral  Level: L3, L4, L5, and S1 Medial Branch Level(s). Injecting these levels blocks the L4-5 and L5-S1 lumbar facet joints. Imaging: Fluoroscopic guidance         Anesthesia: Local anesthesia (1-2% Lidocaine) Anxiolysis: IV Versed 3.0 mg Sedation: Moderate Sedation None required. No Fentanyl administered.         DOS: 11/21/2022 Performed by: Oswaldo Done, MD  Primary Purpose: Diagnostic/Therapeutic Indications: Low back pain severe enough to impact quality of life or function. 1. Lumbar facet syndrome (Bilateral) (R>L)   2. Lumbar facet joint pain   3. Spondylosis without myelopathy or radiculopathy, lumbosacral region   4. Facet joint sclerosis (Lumbar)   5. Osteoarthritis of facet joint of lumbar spine   6. Chronic low back pain (1ry area of Pain) (Bilateral) (R>L) w/o sciatica   7. DDD (degenerative disc disease), lumbosacral   8. Low back pain of over 3 months duration   9. Encounter for therapeutic procedure    NAS-11 Pain score:   Pre-procedure: 7 /10   Post-procedure: 0-No pain/10   Note: No pain tolerance. We'll need to take  into account when reporting pain levels. Thrashes around w/ local. Constant movement during procedure. Non-compliant w/ instructions not to move. Requesting more sedation despite slurred speech and having been informed that general anesthesia is contraindicated during diagnostic nerve blocks due to high risk of nerve injury.     Position / Prep / Materials:  Position: Prone  Prep solution: DuraPrep (Iodine Povacrylex [0.7% available iodine] and Isopropyl Alcohol, 74% w/w) Area Prepped: Posterolateral Lumbosacral Spine (Wide prep: From the lower border of the scapula down to the end of the tailbone and from flank to flank.)  Materials:  Tray: Block Needle(s):  Type: Spinal  Gauge (G): 22  Length: 3.5-in Qty: 4      Pre-op H&P Assessment:  Grace Shepard is a 57 y.o. (year old), female patient, seen today for interventional treatment. She  has a past surgical history that includes Tubal ligation and Cholecystectomy (N/A, 02/08/2022). Grace Shepard has a current medication list which includes the following prescription(s): acetaminophen, albuterol, baclofen, cetirizine, vitamin d3, clobetasol cream, divalproex, estradiol, finasteride, levothyroxine, montelukast, pantoprazole, pregabalin, rosuvastatin, topiramate, trazodone, triamcinolone cream, cyanocobalamin, and vitamin e, and the following Facility-Administered Medications: fentanyl and ropivacaine (pf) 2 mg/ml (0.2%). Her primarily concern today is the Back Pain (lower)  Initial Vital Signs:  Pulse/HCG Rate: 74ECG Heart Rate: 86 (NSR) Temp: (!) 97.2 F (36.2 C) Resp: 14 BP: 105/79 SpO2: 100 %  BMI: Estimated body mass index is 30.31 kg/m as calculated from the following:   Height as of this encounter: 4\' 10"  (1.473 m).   Weight as of this encounter: 145 lb (65.8 kg).  Risk Assessment: Allergies: Reviewed. She is allergic to bactrim [sulfamethoxazole-trimethoprim], gabapentin, and tramadol.  Allergy Precautions: None  required Coagulopathies:  Reviewed. None identified.  Blood-thinner therapy: None at this time Active Infection(s): Reviewed. None identified. Grace Shepard is afebrile  Site Confirmation: Grace Shepard was asked to confirm the procedure and laterality before marking the site Procedure checklist: Completed Consent: Before the procedure and under the influence of no sedative(s), amnesic(s), or anxiolytics, the patient was informed of the treatment options, risks and possible complications. To fulfill our ethical and legal obligations, as recommended by the American Medical Association's Code of Ethics, I have informed the patient of my clinical impression; the nature and purpose of the treatment or procedure; the risks, benefits, and possible complications of the intervention; the alternatives, including doing nothing; the risk(s) and benefit(s) of the alternative treatment(s) or procedure(s); and the risk(s) and benefit(s) of doing nothing. The patient was provided information about the general risks and possible complications associated with the procedure. These may include, but are not limited to: failure to achieve desired goals, infection, bleeding, organ or nerve damage, allergic reactions, paralysis, and death. In addition, the patient was informed of those risks and complications associated to Spine-related procedures, such as failure to decrease pain; infection (i.e.: Meningitis, epidural or intraspinal abscess); bleeding (i.e.: epidural hematoma, subarachnoid hemorrhage, or any other type of intraspinal or peri-dural bleeding); organ or nerve damage (i.e.: Any type of peripheral nerve, nerve root, or spinal cord injury) with subsequent damage to sensory, motor, and/or autonomic systems, resulting in permanent pain, numbness, and/or weakness of one or several areas of the body; allergic reactions; (i.e.: anaphylactic reaction); and/or death. Furthermore, the patient was informed of those risks and  complications associated with the medications. These include, but are not limited to: allergic reactions (i.e.: anaphylactic or anaphylactoid reaction(s)); adrenal axis suppression; blood sugar elevation that in diabetics may result in ketoacidosis or comma; water retention that in patients with history of congestive heart failure may result in shortness of breath, pulmonary edema, and decompensation with resultant heart failure; weight gain; swelling or edema; medication-induced neural toxicity; particulate matter embolism and blood vessel occlusion with resultant organ, and/or nervous system infarction; and/or aseptic necrosis of one or more joints. Finally, the patient was informed that Medicine is not an exact science; therefore, there is also the possibility of unforeseen or unpredictable risks and/or possible complications that may result in a catastrophic outcome. The patient indicated having understood very clearly. We have given the patient no guarantees and we have made no promises. Enough time was given to the patient to ask questions, all of which were answered to the patient's satisfaction. Ms. Fosberg has indicated that she wanted to continue with the procedure. Attestation: I, the ordering provider, attest that I have discussed with the patient the benefits, risks, side-effects, alternatives, likelihood of achieving goals, and potential problems during recovery for the procedure that I have provided informed consent. Date  Time: 11/21/2022  8:20 AM   Pre-Procedure Preparation:  Monitoring: As per clinic protocol. Respiration, ETCO2, SpO2, BP, heart rate and rhythm monitor placed and checked for adequate function Safety Precautions: Patient was assessed for positional comfort and pressure points before starting the procedure. Time-out: I initiated and conducted the "Time-out" before starting the procedure, as per protocol. The patient was asked to participate by confirming the accuracy of the  "Time Out" information. Verification of the correct person, site, and procedure were performed and confirmed by me, the nursing staff, and the patient. "Time-out" conducted as per Joint Commission's Universal Protocol (UP.01.01.01). Time: 0849 Start Time: 0849 hrs.  Description of Procedure:  Laterality: (see above) Targeted Levels: (see above)  Safety Precautions: Aspiration looking for blood return was conducted prior to all injections. At no point did we inject any substances, as a needle was being advanced. Before injecting, the patient was told to immediately notify me if she was experiencing any new onset of "ringing in the ears, or metallic taste in the mouth". No attempts were made at seeking any paresthesias. Safe injection practices and needle disposal techniques used. Medications properly checked for expiration dates. SDV (single dose vial) medications used. After the completion of the procedure, all disposable equipment used was discarded in the proper designated medical waste containers. Local Anesthesia: Protocol guidelines were followed. The patient was positioned over the fluoroscopy table. The area was prepped in the usual manner. The time-out was completed. The target area was identified using fluoroscopy. A 12-in long, straight, sterile hemostat was used with fluoroscopic guidance to locate the targets for each level blocked. Once located, the skin was marked with an approved surgical skin marker. Once all sites were marked, the skin (epidermis, dermis, and hypodermis), as well as deeper tissues (fat, connective tissue and muscle) were infiltrated with a small amount of a short-acting local anesthetic, loaded on a 10cc syringe with a 25G, 1.5-in  Needle. An appropriate amount of time was allowed for local anesthetics to take effect before proceeding to the next step. Local Anesthetic: Lidocaine 2.0% The unused portion of the local anesthetic was discarded in the proper designated  containers. Technical description of process:   L3 Medial Branch Nerve Block (MBB): The target area for the L3 medial branch is at the junction of the postero-lateral aspect of the superior articular process and the superior, posterior, and medial edge of the transverse process of L4. Under fluoroscopic guidance, a Quincke needle was inserted until contact was made with os over the superior postero-lateral aspect of the pedicular shadow (target area). After negative aspiration for blood, 0.5 mL of the nerve block solution was injected without difficulty or complication. The needle was removed intact. L4 Medial Branch Nerve Block (MBB): The target area for the L4 medial branch is at the junction of the postero-lateral aspect of the superior articular process and the superior, posterior, and medial edge of the transverse process of L5. Under fluoroscopic guidance, a Quincke needle was inserted until contact was made with os over the superior postero-lateral aspect of the pedicular shadow (target area). After negative aspiration for blood, 0.5 mL of the nerve block solution was injected without difficulty or complication. The needle was removed intact. L5 Medial Branch Nerve Block (MBB): The target area for the L5 medial branch is at the junction of the postero-lateral aspect of the superior articular process and the superior, posterior, and medial edge of the sacral ala. Under fluoroscopic guidance, a Quincke needle was inserted until contact was made with os over the superior postero-lateral aspect of the pedicular shadow (target area). After negative aspiration for blood, 0.5 mL of the nerve block solution was injected without difficulty or complication. The needle was removed intact. S1 Medial Branch Nerve Block (MBB): The target area for the S1 medial branch is at the posterior and inferior 6 o'clock position of the L5-S1 facet joint. Under fluoroscopic guidance, the Quincke needle inserted for the L5 MBB  was redirected until contact was made with os over the inferior and postero aspect of the sacrum, at the 6 o' clock position under the L5-S1 facet joint (Target area). After negative aspiration for blood, 0.5 mL  of the nerve block solution was injected without difficulty or complication. The needle was removed intact.  Once the entire procedure was completed, the treated area was cleaned, making sure to leave some of the prepping solution back to take advantage of its long term bactericidal properties.         Illustration of the posterior view of the lumbar spine and the posterior neural structures. Laminae of L2 through S1 are labeled. DPRL5, dorsal primary ramus of L5; DPRS1, dorsal primary ramus of S1; DPR3, dorsal primary ramus of L3; FJ, facet (zygapophyseal) joint L3-L4; I, inferior articular process of L4; LB1, lateral branch of dorsal primary ramus of L1; IAB, inferior articular branches from L3 medial branch (supplies L4-L5 facet joint); IBP, intermediate branch plexus; MB3, medial branch of dorsal primary ramus of L3; NR3, third lumbar nerve root; S, superior articular process of L5; SAB, superior articular branches from L4 (supplies L4-5 facet joint also); TP3, transverse process of L3.   Facet Joint Innervation (* possible contribution)  L1-2 T12, L1 (L2*)  Medial Branch  L2-3 L1, L2 (L3*)         "          "  L3-4 L2, L3 (L4*)         "          "  L4-5 L3, L4 (L5*)         "          "  L5-S1 L4, L5, S1          "          "    Vitals:   11/21/22 0920 11/21/22 0928 11/21/22 0930 11/21/22 0938  BP: 96/66 98/64 99/68  112/64  Pulse:      Resp: 14 (!) 9 15 16   Temp:      TempSrc:      SpO2: 100% 100% 97% 95%  Weight:      Height:         End Time: 0859 hrs.  Imaging Guidance (Spinal):          Type of Imaging Technique: Fluoroscopy Guidance (Spinal) Indication(s): Assistance in needle guidance and placement for procedures requiring needle placement in or near specific  anatomical locations not easily accessible without such assistance. Exposure Time: Please see nurses notes. Contrast: None used. Fluoroscopic Guidance: I was personally present during the use of fluoroscopy. "Tunnel Vision Technique" used to obtain the best possible view of the target area. Parallax error corrected before commencing the procedure. "Direction-depth-direction" technique used to introduce the needle under continuous pulsed fluoroscopy. Once target was reached, antero-posterior, oblique, and lateral fluoroscopic projection used confirm needle placement in all planes. Images permanently stored in EMR. Interpretation: No contrast injected. I personally interpreted the imaging intraoperatively. Adequate needle placement confirmed in multiple planes. Permanent images saved into the patient's record.  Post-operative Assessment:  Post-procedure Vital Signs:  Pulse/HCG Rate: 7475 Temp: (!) 97.2 F (36.2 C) Resp: 16 BP: 112/64 SpO2: 95 %  EBL: None  Complications: No immediate post-treatment complications observed by team, or reported by patient.  Note: The patient tolerated the entire procedure well. A repeat set of vitals were taken after the procedure and the patient was kept under observation following institutional policy, for this type of procedure. Post-procedural neurological assessment was performed, showing return to baseline, prior to discharge. The patient was provided with post-procedure discharge instructions, including a section on how to identify potential problems. Should any problems arise  concerning this procedure, the patient was given instructions to immediately contact us, at any time, without hesitation. In any case, we plan to contact the patient by telephone for a follow-up status report regarding this interventional procedure.  Comments:  No additional relevant information.  Plan of Care (POC)  Orders:  Orders Placed This Encounter  Procedures   LUMBAR  FACET(MEDIAL BRANCH NERVE BLOCK) MBNB    Scheduling Instructions:     Procedure: Lumbar facet block (AKA.: Lumbosacral medial branch nerve block)     Side: Bilateral     Level: L3-4, L4-5, L5-S1, and TBD Facets (L2, L3, L4, L5, S1, and TBD Medial Branch Nerves)     Sedation: Patient's choice.     Timeframe: Today    Order Specific Question:   Where will this procedure be performed?    Answer:   [facility]   DG [facility] C-ARM 1-60 MIN NO REPORT    Intraoperative interpretation by procedural physician at [facility].    Standing Status:   Standing    Number of Occurrences:   1    Order Specific Question:   Reason for exam:    Answer:   Assistance in needle guidance and placement for procedures requiring needle placement in or near specific anatomical locations not easily accessible without such assistance.   Informed Consent Details: Physician/Practitioner Attestation; Transcribe to consent form and obtain patient signature    Nursing Order: Transcribe to consent form and obtain patient signature. Note: Always confirm laterality of pain with Ms. Loreta Ave, before procedure.    Order Specific Question:   Physician/Practitioner attestation of informed consent for procedure/surgical case    Answer:   I, the physician/practitioner, attest that I have discussed with the patient the benefits, risks, side effects, alternatives, likelihood of achieving goals and potential problems during recovery for the procedure that I have provided informed consent.    Order Specific Question:   Procedure    Answer:   Lumbar Facet Block  under fluoroscopic guidance    Order Specific Question:   Physician/Practitioner performing the procedure    Answer:   Maraki Macquarrie A. Laban Emperor MD    Order Specific Question:   Indication/Reason    Answer:   Low Back Pain, with our without leg pain, due to Facet Joint Arthralgia (Joint Pain) Spondylosis (Arthritis of the Spine), without myelopathy or radiculopathy  (Nerve Damage).   Provide equipment / supplies at bedside    Procedure tray: "Block Tray" (Disposable  single use) Skin infiltration needle: Regular 1.5-in, 25-G, (x1) Block Needle type: Spinal Amount/quantity: 4 Size: Regular (3.5-inch) Gauge: 22G    Standing Status:   Standing    Number of Occurrences:   1    Order Specific Question:   Specify    Answer:   Block Tray   Follow-up    Schedule Ms. Chavira for a post-procedure follow-up evaluation encounter 2 weeks from now.    Standing Status:   Future    Standing Expiration Date:   12/05/2022    Scheduling Instructions:     Schedule follow-up visit on afternoon of procedure day (T, Th)     Type: Face-to-face (F2F) Post-procedure (PP) evaluation (E/M)     When: 2 weeks from now   Chronic Opioid Analgesic:  None. No opioid analgesics prescribed by our practice. MME/day: 0 mg/day   Medications ordered for procedure: Meds ordered this encounter  Medications   lidocaine (XYLOCAINE) 2 % (with pres) injection 400 mg   pentafluoroprop-tetrafluoroeth (GEBAUERS)  aerosol   lactated ringers infusion   midazolam (VERSED) 5 MG/5ML injection 0.5-2 mg    Make sure Flumazenil is available in the pyxis when using this medication. If oversedation occurs, administer 0.2 mg IV over 15 sec. If after 45 sec no response, administer 0.2 mg again over 1 min; may repeat at 1 min intervals; not to exceed 4 doses (1 mg)   fentaNYL (SUBLIMAZE) injection 25-50 mcg    Make sure Narcan is available in the pyxis when using this medication. In the event of respiratory depression (RR< 8/min): Titrate NARCAN (naloxone) in increments of 0.1 to 0.2 mg IV at 2-3 minute intervals, until desired degree of reversal.   ropivacaine (PF) 2 mg/mL (0.2%) (NAROPIN) injection 18 mL   triamcinolone acetonide (KENALOG-40) injection 80 mg   Medications administered: We administered lidocaine, pentafluoroprop-tetrafluoroeth, lactated ringers, midazolam, and triamcinolone acetonide.   See the medical record for exact dosing, route, and time of administration.  Follow-up plan:   Return in about 2 weeks (around 12/05/2022) for Proc-day (T,Th), (Face2F), (PPE).       Interventional Therapies  Risk Factors  Considerations:   Note: No pain tolerance. We'll need to take into account when reporting pain levels. Thrashes around w/ local. Constant movement during procedure. Non-compliant w/ instructions not to move. Requesting more sedation despite slurred speech and having been informed that general anesthesia is contraindicated during diagnostic nerve blocks due to high risk of nerve injury.    Planned  Pending:   Diagnostic bilateral lumbar facet MBB w/ mapping #1 (11/21/2022)    Under consideration:      Completed:   Diagnostic bilateral lumbar facet MBB w/ mapping x1 (11/21/2022) (7-0/10)  Therapeutic right inferior SI joint Blk x3 (06/28/2021) (0/0/90/90)  Therapeutic left IA hip inj. x2 (11/21/2020) (100/100/50/RLEP:60  LHip:70)  Therapeutic right trochanteric bursa inj. x2 (06/28/2021) (0/0/90/90)  Therapeutic left trochanteric bursa inj. x2 (11/21/2020) (100/100/50/RLEP:60  LHip:70)    Therapeutic  Palliative (PRN) options:         Recent Visits Date Type Provider Dept  11/06/22 Office Visit Delano Metz, MD [facility]  Showing recent visits within past 90 days and meeting all other requirements Today's Visits Date Type Provider Dept  11/21/22 Procedure visit Delano Metz, MD [facility]  Showing today's visits and meeting all other requirements Future Appointments Date Type Provider Dept  12/05/22 Appointment Delano Metz, MD [facility]  Showing future appointments within next 90 days and meeting all other requirements  Disposition: Discharge home  Discharge (Date  Time): 11/21/2022; 0944 hrs.   Primary Care Physician: Lorre Munroe, NP Location: [facility] Note by:  Oswaldo Done, MD (TTS technology used. I apologize for any typographical errors that were not detected and corrected.) Date: 11/21/2022; Time: 10:36 AM  Disclaimer:  Medicine is not an Visual merchandiser. The only guarantee in medicine is that nothing is guaranteed. It is important to note that the decision to proceed with this intervention was based on the information collected from the patient. The Data and conclusions were drawn from the patient's questionnaire, the interview, and the physical examination. Because the information was provided in large part by the patient, it cannot be guaranteed that it has not been purposely or unconsciously manipulated. Every effort has been made to obtain as much relevant data as possible for this evaluation. It is important to note that the conclusions that lead to this procedure are derived in large part from the available  data. Always take into account that the treatment will also be dependent on availability of resources and existing treatment guidelines, considered by other Pain Management Practitioners as being common knowledge and practice, at the time of the intervention. For Medico-Legal purposes, it is also important to point out that variation in procedural techniques and pharmacological choices are the acceptable norm. The indications, contraindications, technique, and results of the above procedure should only be interpreted and judged by a Board-Certified Interventional Pain Specialist with extensive familiarity and expertise in the same exact procedure and technique.

## 2022-11-21 ENCOUNTER — Ambulatory Visit
Admission: RE | Admit: 2022-11-21 | Discharge: 2022-11-21 | Disposition: A | Discharge status: Home / Self Care | Source: Ambulatory Visit | Attending: Pain Medicine | Admitting: Pain Medicine

## 2022-11-21 ENCOUNTER — Ambulatory Visit: Attending: Pain Medicine | Admitting: Pain Medicine

## 2022-11-21 ENCOUNTER — Encounter: Payer: Self-pay | Admitting: Pain Medicine

## 2022-11-21 VITALS — BP 112/64 | HR 74 | Temp 97.2°F | Resp 16 | Ht <= 58 in | Wt 145.0 lb

## 2022-11-21 DIAGNOSIS — M5459 Other low back pain: Secondary | ICD-10-CM | POA: Diagnosis present

## 2022-11-21 DIAGNOSIS — M5137 Other intervertebral disc degeneration, lumbosacral region: Secondary | ICD-10-CM | POA: Insufficient documentation

## 2022-11-21 DIAGNOSIS — Z5189 Encounter for other specified aftercare: Secondary | ICD-10-CM | POA: Diagnosis present

## 2022-11-21 DIAGNOSIS — M47816 Spondylosis without myelopathy or radiculopathy, lumbar region: Secondary | ICD-10-CM | POA: Diagnosis present

## 2022-11-21 DIAGNOSIS — M47817 Spondylosis without myelopathy or radiculopathy, lumbosacral region: Secondary | ICD-10-CM | POA: Insufficient documentation

## 2022-11-21 DIAGNOSIS — M545 Low back pain, unspecified: Secondary | ICD-10-CM | POA: Insufficient documentation

## 2022-11-21 DIAGNOSIS — M47819 Spondylosis without myelopathy or radiculopathy, site unspecified: Secondary | ICD-10-CM | POA: Diagnosis present

## 2022-11-21 DIAGNOSIS — G8929 Other chronic pain: Secondary | ICD-10-CM | POA: Diagnosis present

## 2022-11-21 MED ORDER — LIDOCAINE HCL 2 % IJ SOLN
INTRAMUSCULAR | Status: AC
  Filled 2022-11-21: qty 20

## 2022-11-21 MED ORDER — PENTAFLUOROPROP-TETRAFLUOROETH EX AERO
INHALATION_SPRAY | Freq: Once | CUTANEOUS | Status: AC
  Administered 2022-11-21: 30 via TOPICAL

## 2022-11-21 MED ORDER — LACTATED RINGERS IV SOLN: Freq: Once | INTRAVENOUS | Status: AC

## 2022-11-21 MED ORDER — LIDOCAINE HCL 2 % IJ SOLN
20.0000 mL | Freq: Once | INTRAMUSCULAR | Status: AC
  Administered 2022-11-21: 400 mg

## 2022-11-21 MED ORDER — ROPIVACAINE HCL 2 MG/ML IJ SOLN: 18.0000 mL | Freq: Once | INTRAMUSCULAR | Status: DC

## 2022-11-21 MED ORDER — FENTANYL CITRATE (PF) 100 MCG/2ML IJ SOLN: 25.0000 ug | INTRAMUSCULAR | Status: DC | PRN

## 2022-11-21 MED ORDER — TRIAMCINOLONE ACETONIDE 40 MG/ML IJ SUSP
80.0000 mg | Freq: Once | INTRAMUSCULAR | Status: AC
  Administered 2022-11-21: 80 mg

## 2022-11-21 MED ORDER — TRIAMCINOLONE ACETONIDE 40 MG/ML IJ SUSP
INTRAMUSCULAR | Status: AC
  Filled 2022-11-21: qty 2

## 2022-11-21 MED ORDER — ROPIVACAINE HCL 2 MG/ML IJ SOLN
INTRAMUSCULAR | Status: AC
  Filled 2022-11-21: qty 20

## 2022-11-21 MED ORDER — FENTANYL CITRATE (PF) 100 MCG/2ML IJ SOLN
INTRAMUSCULAR | Status: AC
  Filled 2022-11-21: qty 2

## 2022-11-21 MED ORDER — MIDAZOLAM HCL 5 MG/5ML IJ SOLN
0.5000 mg | Freq: Once | INTRAMUSCULAR | Status: AC
  Administered 2022-11-21: 3 mg via INTRAVENOUS

## 2022-11-21 MED ORDER — MIDAZOLAM HCL 5 MG/5ML IJ SOLN
INTRAMUSCULAR | Status: AC
  Filled 2022-11-21: qty 5

## 2022-11-21 NOTE — Patient Instructions (Signed)

## 2022-11-22 ENCOUNTER — Telehealth: Payer: Self-pay

## 2022-11-22 NOTE — Telephone Encounter (Signed)
Called PpDenies any needs at this time. Instructed to call if needed.

## 2022-11-28 ENCOUNTER — Encounter

## 2022-12-03 ENCOUNTER — Ambulatory Visit (INDEPENDENT_AMBULATORY_CARE_PROVIDER_SITE_OTHER): Admitting: Dermatology

## 2022-12-03 ENCOUNTER — Encounter: Payer: Self-pay | Admitting: Dermatology

## 2022-12-03 DIAGNOSIS — L309 Dermatitis, unspecified: Secondary | ICD-10-CM | POA: Diagnosis not present

## 2022-12-03 DIAGNOSIS — L988 Other specified disorders of the skin and subcutaneous tissue: Secondary | ICD-10-CM

## 2022-12-03 DIAGNOSIS — L649 Androgenic alopecia, unspecified: Secondary | ICD-10-CM

## 2022-12-03 MED ORDER — CLOBETASOL PROPIONATE 0.05 % EX CREA: TOPICAL_CREAM | CUTANEOUS | 1 refills | Status: DC

## 2022-12-03 MED ORDER — FINASTERIDE 5 MG PO TABS: 5.0000 mg | ORAL_TABLET | Freq: Every day | ORAL | 0 refills | Status: DC

## 2022-12-03 MED ORDER — MINOXIDIL 2.5 MG PO TABS: 2.5000 mg | ORAL_TABLET | Freq: Every day | ORAL | 2 refills | Status: DC

## 2022-12-03 MED ORDER — ALTRENO 0.05 % EX LOTN: TOPICAL_LOTION | CUTANEOUS | 5 refills | Status: AC

## 2022-12-03 NOTE — Patient Instructions (Addendum)
For hair growth  Continue finasteride 5 MG take 1/2 tablet by mouth daily   Start minoxidil 2.5 MG take 1/2 tablet by mouth daily   Doses of minoxidil for hair loss are considered 'low dose'. This is because the doses used for hair loss are much lower than the doses which are used for conditions such as high blood pressure (hypertension). The doses used for hypertension are 10-40mg  per day.  Side effects are uncommon at the low doses (up to 2.5 mg/day) used to treat hair loss. Potential side effects, more commonly seen at higher doses, include: Increase in hair growth (hypertrichosis) elsewhere on face and body Temporary hair shedding upon starting medication which may last up to 4 weeks Ankle swelling, fluid retention, rapid weight gain more than 5 pounds Low blood pressure and feeling lightheaded or dizzy when standing up quickly Fast or irregular heartbeat Headaches   Basic OTC daily skin care regimen to prevent photoaging:   Recommend facial moisturizer with sunscreen SPF 30 every morning (OTC brands include CeraVe AM, Neutrogena, Eucerin, Cetaphil, Aveeno, La Roche Posay).  Can also apply a topical Vit C serum which is an antioxidant (OTC brands include CeraVe, La Roche Posay, and The Ordinary) underneath sunscreen in morning. If you are outside during the day in the summer for extended periods, especially swimming and/or sweating, make sure you apply a water resistant facial sunscreen lotion spf 30 or higher.   At night recommend a cream with retinol (a vitamin A derivative which stimulates collagen production) like CeraVe skin renewing retinol serum or ROC retinol correxion cream or Neutrogena rapid wrinkle repair cream. Retinol may cause skin irritation in people with sensitive skin.  Can use it every other day and/or apply on top of a hyaluronic acid (HA) moisturizer/serum (Neutrogena Hydroboost water cream) if better tolerated that way.  Retinol may also help with lightening brown spots.    Our office sells high quality, medically tested skin care lines such as Elta MD sunscreens (with Zinc), and Alastin skin care products, which are very effective in treating photoaging. The Alastin line includes cosmeceutical grade Vit.C serum, HA serum, Elastin stimulating moisturizers/serums, lightening serum, and sunscreens.  If you want prescription treatment, then you would need an appointment (Rx tretinoin and fade creams, Botox, filler injections, laser treatments, etc.) These prescriptions and procedures are not covered by insurance but work very well.    Due to recent changes in healthcare laws, you may see results of your pathology and/or laboratory studies on MyChart before the doctors have had a chance to review them. We understand that in some cases there may be results that are confusing or concerning to you. Please understand that not all results are received at the same time and often the doctors may need to interpret multiple results in order to provide you with the best plan of care or course of treatment. Therefore, we ask that you please give [place] 2 business days to thoroughly review all your results before contacting the office for clarification. Should we see a critical lab result, you will be contacted sooner.   If You Need Anything After Your Visit  If you have any questions or concerns for your doctor, please call our main line at [phone] and press option 4 to reach your doctor's medical assistant. If no one answers, please leave a voicemail as directed and we will return your call as soon as possible. Messages left after 4 pm will be answered the following business day.  You may also send [place] a message via MyChart. We typically respond to MyChart messages within 1-2 business days.  For prescription refills, please ask your pharmacy to contact our office. Our fax number is [phone].  If you have an urgent issue when the clinic is closed that cannot wait until the  next business day, you can page your doctor at the number below.    Please note that while we do our best to be available for urgent issues outside of office hours, we are not available 24/7.   If you have an urgent issue and are unable to reach [place], you may choose to seek medical care at your doctor's office, retail clinic, urgent care center, or emergency room.  If you have a medical emergency, please immediately call 911 or go to the emergency department.  Pager Numbers  - Dr. Gwen Pounds: [phone]  - Dr. Neale Burly: [phone]  - Dr. Roseanne Reno: [phone]  In the event of inclement weather, please call our main line at [phone] for an update on the status of any delays or closures.  Dermatology Medication Tips: Please keep the boxes that topical medications come in in order to help keep track of the instructions about where and how to use these. Pharmacies typically print the medication instructions only on the boxes and not directly on the medication tubes.   If your medication is too expensive, please contact our office at [phone] option 4 or send [place] a message through MyChart.   We are unable to tell what your co-pay for medications will be in advance as this is different depending on your insurance coverage. However, we may be able to find a substitute medication at lower cost or fill out paperwork to get insurance to cover a needed medication.   If a prior authorization is required to get your medication covered by your insurance company, please allow [place] 1-2 business days to complete this process.  Drug prices often vary depending on where the prescription is filled and some pharmacies may offer cheaper prices.  The website www.goodrx.com contains coupons for medications through different pharmacies. The prices here do not account for what the cost may be with help from insurance (it may be cheaper with your insurance), but the website can give you the price if you did not  use any insurance.  - You can print the associated coupon and take it with your prescription to the pharmacy.  - You may also stop by our office during regular business hours and pick up a GoodRx coupon card.  - If you need your prescription sent electronically to a different pharmacy, notify our office through [facility] or by phone at [phone] option 4.     Si Usted Necesita Algo Despus de Su Visita  Tambin puede enviarnos un mensaje a travs de Clinical cytogeneticist. Por lo general respondemos a los mensajes de MyChart en el transcurso de 1 a 2 das hbiles.  Para renovar recetas, por favor pida a su farmacia que se ponga en contacto con nuestra oficina. Annie Sable de fax es [place] [phone].  Si tiene un asunto urgente cuando la clnica est cerrada y que no puede esperar hasta el siguiente da hbil, puede llamar/localizar a su doctor(a) al nmero que aparece a continuacin.   Por favor, tenga en cuenta que aunque hacemos todo lo posible para estar disponibles para asuntos urgentes fuera del horario de [place], no estamos disponibles las 24 horas del da, los 7 [address]  [address] de la [place].  Si tiene un problema urgente y no puede comunicarse con nosotros, puede optar por buscar atencin mdica  en el consultorio de su doctor(a), en una clnica privada, en un centro de atencin urgente o en una sala de emergencias.  Si tiene Engineer, drilling, por favor llame inmediatamente al 911 o vaya a la sala de emergencias.  Nmeros de bper  - Dr. Gwen Pounds: [phone]  - Dra. Moye: [phone]  - Dra. Roseanne Reno: [phone]  En caso de inclemencias del [place], por favor llame a Lacy Duverney principal al [phone] para una actualizacin sobre el [place] de cualquier retraso o cierre.  Consejos para la medicacin en dermatologa: Por favor, guarde las cajas en las que vienen los medicamentos de uso tpico para ayudarle a seguir las instrucciones sobre dnde y cmo usarlos. Las farmacias  generalmente imprimen las instrucciones del medicamento slo en las cajas y no directamente en los tubos del [place].   Si su medicamento es muy caro, por favor, pngase en contacto con Rolm Gala llamando al [phone] y presione la opcin 4 o envenos un mensaje a travs de Clinical cytogeneticist.   No podemos decirle cul ser su copago por los medicamentos por adelantado ya que esto es diferente dependiendo de la cobertura de su seguro. Sin embargo, es posible que podamos encontrar un medicamento sustituto a Audiological scientist un formulario para que el seguro cubra el medicamento que se considera necesario.   Si se requiere una autorizacin previa para que su compaa de seguros [place] su medicamento, por favor permtanos de 1 a 2 das hbiles para completar [address]  Los precios de los medicamentos varan con frecuencia dependiendo del Environmental consultant de dnde se surte la receta y alguna farmacias pueden ofrecer precios ms baratos.  El sitio web www.goodrx.com tiene cupones para medicamentos de Health and safety inspector. Los precios aqu no tienen en cuenta lo que podra costar con la ayuda del seguro (puede ser ms barato con su seguro), pero el sitio web puede darle el precio si no utiliz Tourist information centre manager.  - Puede imprimir el cupn correspondiente y llevarlo con su receta a la farmacia.  - Tambin puede pasar por nuestra oficina durante el horario de atencin regular y Education officer, museum una tarjeta de cupones de GoodRx.  - Si necesita que su receta se enve electrnicamente a una farmacia diferente, informe a nuestra oficina a travs de MyChart de [place] o por telfono llamando al [phone] y presione la opcin 4.

## 2022-12-03 NOTE — Progress Notes (Signed)
Follow-Up Visit   Subjective  Grace Shepard is a 57 y.o. female who presents for the following: 3 month hand dermatitis using clobetasol cream as needed for flares and follow up. 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  Feels like it is helping.  No side effects noted.   The following portions of the chart were reviewed this encounter and updated as appropriate: medications, allergies, medical history  Review of Systems:  No other skin or systemic complaints except as noted in HPI or Assessment and Plan.  Objective  Well appearing patient in no apparent distress; mood and affect are within normal limits.   A focused examination was performed of the following areas: scalp, face, and b/l hands   Relevant exam findings are noted in the Assessment and Plan.    Assessment & Plan   Hand dermatitis hands, fingers  Exam:  Mild erythema and scale on mcp and pip joints   Chronic condition with duration or expected duration over one year. Currently well-controlled.    Hand Dermatitis is a chronic type of eczema that can come and go on the hands and fingers.  While there is no cure, the rash and symptoms can be managed with topical prescription medications, and for more severe cases, with systemic medications.  Recommend mild soap and routine use of moisturizing cream after handwashing.  Minimize soap/water exposure when possible.  Wear gloves when cleaning with chemicals. Sample of Neutrogena Gel Cream, Cetaphil Healing Ointment, CeraVe Healing Ointment.   Continue as needed for flares clobetasol cream Apply to AA hands/fingers once to twice daily until improved, avoid face, groin, axilla.  Topical steroids (such as triamcinolone, fluocinolone, fluocinonide, mometasone, clobetasol, halobetasol, betamethasone, hydrocortisone) can cause thinning and lightening of the skin if they are used for too long in the same area. Your physician has selected the right strength medicine  for your problem and area affected on the body. Please use your medication only as directed by your physician to prevent side effects.   Elastosis of skin face   Continue [place] Lotion Apply to face QHS as tolerated   Androgenetic alopecia Scalp  Frontal scalp thinning with intact frontal hairline and miniaturization.  Improvement noted when compared to baseline photos.   Chronic and persistent condition with duration or expected duration over one year. Condition is symptomatic/ bothersome to patient. Not currently at goal. Stable with some improvement    Has improved some while taking finasteride   Female Androgenic Alopecia is a chronic condition related to genetics and/or hormonal changes.  In women androgenetic alopecia is commonly associated with menopause but may occur any time after puberty.  It causes hair thinning primarily on the crown with widening of the part and temporal hairline recession.  Can use OTC Rogaine (minoxidil) 5% solution/foam as directed.  Oral treatments in female patients who have no contraindication may include : - Low dose oral minoxidil 1.25 -  daily - Spironolactone 50 -  bid - Finasteride 2.5 - 5 mg daily Adjunctive therapies include: - Low Level Laser Light Therapy (LLLT) - Platelet-rich plasma injections (PRP) - Hair Transplants or scalp reduction    Continue finasteride 5 MG take 1/2 tablet po QD   Start minoxidil 2.5 MG take 1/2 tablet po QD   Doses of minoxidil for hair loss are considered 'low dose'. This is because the doses used for hair loss are much lower than the doses which are used for conditions such as high blood pressure (hypertension). The  doses used for hypertension are 10-40mg  per day.  Side effects are uncommon at the low doses (up to 2.5 mg/day) used to treat hair loss. Potential side effects, more commonly seen at higher doses, include: Increase in hair growth (hypertrichosis) elsewhere on face and body Temporary hair  shedding upon starting medication which may last up to 4 weeks Ankle swelling, fluid retention, rapid weight gain more than 5 pounds Low blood pressure and feeling lightheaded or dizzy when standing up quickly Fast or irregular heartbeat Headaches   Return for 3 - 4 month alopecia and hand derm .  I, Asher Muir, CMA, am acting as scribe for Willeen Niece, MD.   Documentation: I have reviewed the above documentation for accuracy and completeness, and I agree with the above.  Willeen Niece, MD

## 2022-12-05 ENCOUNTER — Encounter: Payer: Self-pay | Admitting: Pain Medicine

## 2022-12-05 ENCOUNTER — Ambulatory Visit: Attending: Pain Medicine | Admitting: Pain Medicine

## 2022-12-05 VITALS — BP 109/73 | HR 89 | Temp 97.2°F | Resp 16 | Ht <= 58 in | Wt 145.0 lb

## 2022-12-05 DIAGNOSIS — M47816 Spondylosis without myelopathy or radiculopathy, lumbar region: Secondary | ICD-10-CM

## 2022-12-05 DIAGNOSIS — M545 Low back pain, unspecified: Secondary | ICD-10-CM | POA: Diagnosis present

## 2022-12-05 DIAGNOSIS — M5459 Other low back pain: Secondary | ICD-10-CM | POA: Diagnosis present

## 2022-12-05 DIAGNOSIS — M25551 Pain in right hip: Secondary | ICD-10-CM | POA: Insufficient documentation

## 2022-12-05 DIAGNOSIS — M79605 Pain in left leg: Secondary | ICD-10-CM

## 2022-12-05 DIAGNOSIS — G8929 Other chronic pain: Secondary | ICD-10-CM | POA: Diagnosis present

## 2022-12-05 DIAGNOSIS — M79604 Pain in right leg: Secondary | ICD-10-CM | POA: Insufficient documentation

## 2022-12-05 DIAGNOSIS — M25552 Pain in left hip: Secondary | ICD-10-CM | POA: Insufficient documentation

## 2022-12-05 NOTE — Patient Instructions (Signed)
____________________________________________________________________________________________  Pain Prevention Technique  Definition:   A technique used to minimize the effects of an activity known to cause inflammation or swelling, which in turn leads to an increase in pain.  Purpose: To prevent swelling from occurring. It is based on the fact that it is easier to prevent swelling from happening than it is to get rid of it, once it occurs.  Contraindications: Anyone with allergy or hypersensitivity to the recommended medications. Anyone taking anticoagulants (Blood Thinners) (e.g., Coumadin, Warfarin, Plavix, etc.). Patients in Renal Failure.  Technique: Before you undertake an activity known to cause pain, or a flare-up of your chronic pain, and before you experience any pain, do the following:  On a full stomach, take 4 (four) over the counter Ibuprofens 200mg tablets (Motrin), for a total of 800 mg. In addition, take over the counter Magnesium 400 to 500 mg, before doing the activity.  Six (6) hours later, again on a full stomach, repeat the Ibuprofen. That night, take a warm shower and stretch under the running warm water.  This technique may be sufficient to abort the pain and discomfort before it happens. Keep in mind that it takes a lot less medication to prevent swelling than it takes to eliminate it once it occurs.  ____________________________________________________________________________________________   

## 2022-12-05 NOTE — Progress Notes (Signed)
Safety precautions to be maintained throughout the outpatient stay will include: orient to surroundings, keep bed in low position, maintain call bell within reach at all times, provide assistance with transfer out of bed and ambulation.  

## 2022-12-05 NOTE — Progress Notes (Signed)
PROVIDER NOTE: Information contained herein reflects review and annotations entered in association with encounter. Interpretation of such information and data should be left to medically-trained personnel. Information provided to patient can be located elsewhere in the medical record under "Patient Instructions". Document created using STT-dictation technology, any transcriptional errors that may result from process are unintentional.    Patient: Grace Shepard  Service Category: E/M  Provider: Oswaldo Done, MD  DOB: 06-18-1966  DOS: 12/05/2022  Referring Provider: Lorre Munroe, NP  MRN: [id number]  Specialty: Interventional Pain Management  PCP: Lorre Munroe, NP  Type: Established Patient  Setting: Ambulatory outpatient    Location: Office  Delivery: Face-to-face     HPI  [name], a 57 y.o. year old female, is here today because of her Chronic bilateral low back pain without sciatica [M54.50, G89.29]. Ms. Pomeroy primary complain today is No chief complaint on file.  Pertinent problems: Ms. Marcelli has Fibromyalgia; Migraines; Chronic low back pain (1ry area of Pain) (Bilateral) (R>L) w/o sciatica; Chronic pain syndrome; Chronic lower extremity pain (2ry area of Pain) (Bilateral) (R>L); Chronic hip pain (Bilateral) (R>L); Chronic upper extremity pain (Bilateral); Cervicalgia (Right); Chronic occipital headache (Right); Cervicogenic headache (Right); Occipital neuralgia (lesser occipital nerve) (Right); Cervical facet syndrome (Right); Cervico-occipital neuralgia (Right); Chronic sacroiliac joint pain (Bilateral) (R>L); Lumbar facet syndrome (Bilateral) (R>L); Chronic sacroiliac joint pain (Right); Sacroiliac joint dysfunction (Bilateral) (R>L); Trochanteric bursitis of hip (Bilateral); Chronic hip pain (Left); Other spondylosis, sacral and sacrococcygeal region; Enthesopathy of hip region (Left); Greater trochanteric bursitis of hips (Bilateral); Trochanteric bursitis, right hip;  Chronic low back pain (Right) w/o sciatica; Low back pain of over 3 months duration; Abnormal NCS (nerve conduction studies) (02/12/2016); Spondylosis without myelopathy or radiculopathy, lumbosacral region; DDD (degenerative disc disease), lumbosacral; Lumbar facet joint pain; Facet joint sclerosis (Lumbar); and Osteoarthritis of facet joint of lumbar spine on their pertinent problem list. Pain Assessment: Severity of Chronic pain is reported as a 2 /10. Location: Back Lower, Right/radiates down right leg at times to  knee. Onset: More than a month ago. Quality: Dull. Timing: Intermittent. Modifying factor(s): procedure. Vitals:  height is 4\' 10"  (1.473 m) and weight is 145 lb (65.8 kg). Her temperature is 97.2 F (36.2 C) (abnormal). Her blood pressure is 109/73 and her pulse is 89. Her respiration is 16 and oxygen saturation is 100%.  BMI: Estimated body mass index is 30.31 kg/m as calculated from the following:   Height as of this encounter: 4\' 10"  (1.473 m).   Weight as of this encounter: 145 lb (65.8 kg). Last encounter: 11/06/2022. Last procedure: 11/21/2022.  Reason for encounter: post-procedure evaluation and assessment.  The patient attained 100% relief of the pain for the duration of the local anesthetic which has continued and seems to be ongoing on today's 2-week follow-up.  The patient was provided today with information regarding the facet joint disease as well and has recommendations on physical activities and techniques such as "Preemptive Analgesia" for the purpose of avoiding flareups.  The patient understood and accepted.  The patient was instructed to give [place] a call if and when the pain starts going back.  Post-procedure evaluation   Procedure: Lumbar Facet, Medial Branch Block(s) #1  Laterality: Bilateral  Level: L3, L4, L5, and S1 Medial Branch Level(s). Injecting these levels blocks the L4-5 and L5-S1 lumbar facet joints. Imaging: Fluoroscopic guidance         Anesthesia: Local  anesthesia (1-2% Lidocaine) Anxiolysis: IV Versed 3.0  mg Sedation: Moderate Sedation None required. No Fentanyl administered.         DOS: 11/21/2022 Performed by: Oswaldo Done, MD  Primary Purpose: Diagnostic/Therapeutic Indications: Low back pain severe enough to impact quality of life or function. 1. Lumbar facet syndrome (Bilateral) (R>L)   2. Lumbar facet joint pain   3. Spondylosis without myelopathy or radiculopathy, lumbosacral region   4. Facet joint sclerosis (Lumbar)   5. Osteoarthritis of facet joint of lumbar spine   6. Chronic low back pain (1ry area of Pain) (Bilateral) (R>L) w/o sciatica   7. DDD (degenerative disc disease), lumbosacral   8. Low back pain of over 3 months duration   9. Encounter for therapeutic procedure    NAS-11 Pain score:   Pre-procedure: 7 /10   Post-procedure: 0-No pain/10   Note: No pain tolerance. We'll need to take into account when reporting pain levels. Thrashes around w/ local. Constant movement during procedure. Non-compliant w/ instructions not to move. Requesting more sedation despite slurred speech and having been informed that general anesthesia is contraindicated during diagnostic nerve blocks due to high risk of nerve injury.      Effectiveness:  Initial hour after procedure: 100 %. Subsequent 4-6 hours post-procedure: 100 %. Analgesia past initial 6 hours: 100 % (ongoing bilaterally). Ongoing improvement:  Analgesic: The patient indicates having attained an ongoing 100% relief of the low back pain.  This confirms that we were dealing with a lumbar facet syndrome. Function: Ms. Kasa reports improvement in function ROM: Ms. Bowe reports improvement in ROM  Pharmacotherapy Assessment  Analgesic: None. No opioid analgesics prescribed by our practice. MME/day: 0 mg/day   Monitoring: [place] PMP: PDMP reviewed during this encounter.       Pharmacotherapy: No side-effects or adverse reactions reported. Compliance: No  problems identified. Effectiveness: Clinically acceptable.  Valerie Salts, RN  12/05/2022  2:15 PM  Sign when Signing Visit Safety precautions to be maintained throughout the outpatient stay will include: orient to surroundings, keep bed in low position, maintain call bell within reach at all times, provide assistance with transfer out of bed and ambulation.    No results found for: "CBDTHCR" No results found for: "D8THCCBX" No results found for: "D9THCCBX"  UDS:  No results found for: "SUMMARY"    ROS  Constitutional: Denies any fever or chills Gastrointestinal: No reported hemesis, hematochezia, vomiting, or acute GI distress Musculoskeletal: Denies any acute onset joint swelling, redness, loss of ROM, or weakness Neurological: No reported episodes of acute onset apraxia, aphasia, dysarthria, agnosia, amnesia, paralysis, loss of coordination, or loss of consciousness  Medication Review  Tretinoin, Vitamin D3, acetaminophen, albuterol, baclofen, cetirizine, clobetasol cream, cyanocobalamin, divalproex, estradiol, finasteride, levothyroxine, minoxidil, montelukast, pantoprazole, pregabalin, rosuvastatin, topiramate, traZODone, triamcinolone cream, and vitamin E  History Review  Allergy: Ms. Enriques is allergic to bactrim [sulfamethoxazole-trimethoprim], gabapentin, and tramadol. Drug: Ms. Sidell  reports no history of drug use. Alcohol:  reports no history of alcohol use. Tobacco:  reports that she has never smoked. She has never used smokeless tobacco. Social: Ms. Bolick  reports that she has never smoked. She has never used smokeless tobacco. She reports that she does not drink alcohol and does not use drugs. 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 Surgical: Ms. Decock  has a past surgical history  that includes Tubal ligation and Cholecystectomy (N/A, 02/08/2022). Family: family history includes Alcohol abuse in her paternal uncle and sister; Aneurysm in her mother; Arthritis in her sister; Breast cancer in her maternal aunt; Congestive Heart Failure in her father; Crohn's disease in her sister; Depression in her father, sister, and sister; Diabetes in her brother and father; Drug abuse in her sister; Hearing loss in her father; Heart attack in her maternal grandfather; Heart disease in her sister; Osteoarthritis in her mother; Rheum arthritis in her father; Stroke in her mother.  Laboratory Chemistry Profile   Renal Lab Results  Component Value Date   BUN 13 05/24/2021   CREATININE 0.88 05/24/2021   BCR NOT APPLICABLE 05/24/2021   GFR 64.09 04/11/2020   GFRAA >60 01/17/2020   GFRNONAA 58 (L) 01/17/2020    Hepatic Lab Results  Component Value Date   AST 17 05/24/2021   ALT 25 05/24/2021   ALBUMIN 4.5 04/11/2020   ALKPHOS 106 04/11/2020   LIPASE 24.0 06/15/2015    Electrolytes Lab Results  Component Value Date   NA 142 05/24/2021   K 3.6 05/24/2021   CL 110 05/24/2021   CALCIUM 9.1 05/24/2021   MG 2.2 01/17/2020    Bone Lab Results  Component Value Date   VD25OH 78 03/06/2021    Inflammation (CRP: Acute Phase) (ESR: Chronic Phase) Lab Results  Component Value Date   CRP 1.4 (H) 01/17/2020   ESRSEDRATE 9 01/17/2020         Note: Above Lab results reviewed.  Recent Imaging Review  DG PAIN CLINIC C-ARM 1-60 MIN NO REPORT Fluoro was used, but no Radiologist interpretation will be provided.  Please refer to "NOTES" tab for provider progress note. Note: Reviewed        Physical Exam  General appearance: Well nourished, well developed, and well hydrated. In no apparent acute distress Mental status: Alert, oriented x 3 (person, place, & time)       Respiratory: No  evidence of acute respiratory distress Eyes: PERLA Vitals: BP 109/73   Pulse 89   Temp (!) 97.2 F (36.2 C)   Resp 16   Ht  (1.473 m)   Wt 145 lb (65.8 kg)   LMP 12/12/2014 (Approximate)   SpO2 100%   BMI 30.31 kg/m  BMI: Estimated body mass index is 30.31 kg/m as calculated from the following:   Height as of this encounter:  (1.473 m).   Weight as of this encounter: 145 lb (65.8 kg). Ideal: Patient must be at least 60 in tall to calculate ideal body weight  Assessment   Diagnosis Status  1. Chronic low back pain (1ry area of Pain) (Bilateral) (R>L) w/o sciatica   2. Lumbar facet joint pain   3. Lumbar facet syndrome (Bilateral) (R>L)   4. Chronic hip pain (Bilateral) (R>L)   5. Chronic lower extremity pain (2ry area of Pain) (Bilateral) (R>L)    Resolved Resolved Resolved   Updated Problems: No problems updated.  Plan of Care  Problem-specific:  No problem-specific Assessment & Plan notes found for this encounter.  Ms. THOMASENA VANDENHEUVEL has a current medication list which includes the following long-term medication(s): albuterol, divalproex, levothyroxine, minoxidil, montelukast, pantoprazole, pregabalin, topiramate, and trazodone.  Pharmacotherapy (Medications Ordered): No orders of the defined types were placed in this encounter.  Orders:  No orders of the defined types were placed in this encounter.  Follow-up plan:   Return if symptoms worsen or fail to improve.  Interventional Therapies  Risk Factors  Considerations:   Note: No pain tolerance. We'll need to take into account when reporting pain levels. Thrashes around w/ local. Constant movement during procedure. Non-compliant w/ instructions not to move. Requesting more sedation despite slurred speech and having been informed that general anesthesia is contraindicated during diagnostic nerve blocks due to high risk of nerve injury.    Planned  Pending:   Patient provided today with  information regarding the long-term prognosis of lumbar facet disease including recommendations on physical restrictions and techniques to avoid flareups such as "Preemptive Analgesia".   Under consideration:   Diagnostic bilateral (L4-5, L5-S1) lumbar facet (L3-S1) MBB #2  Possible bilateral lumbar facet RFA #1    Completed:   Diagnostic bilateral (L4-5, L5-S1) lumbar facet (L3-S1) MBB w/ mapping x1 (11/21/2022) (7-0/10) (100/100/100/100) Therapeutic right inferior SI joint Blk x3 (06/28/2021) (0/0/90/90)  Therapeutic left IA hip inj. x2 (11/21/2020) (100/100/50/RLEP:60  LHip:70)  Therapeutic right trochanteric bursa inj. x2 (06/28/2021) (0/0/90/90)  Therapeutic left trochanteric bursa inj. x2 (11/21/2020) (100/100/50/RLEP:60  LHip:70)    Therapeutic  Palliative (PRN) options:         Recent Visits Date Type Provider Dept  11/21/22 Procedure visit Delano Metz, MD [facility]  11/06/22 Office Visit Delano Metz, MD [facility]  Showing recent visits within past 90 days and meeting all other requirements Today's Visits Date Type Provider Dept  12/05/22 Office Visit Delano Metz, MD [facility]  Showing today's visits and meeting all other requirements Future Appointments No visits were found meeting these conditions. Showing future appointments within next 90 days and meeting all other requirements  I discussed the assessment and treatment plan with the patient. The patient was provided an opportunity to ask questions and all were answered. The patient agreed with the plan and demonstrated an understanding of the instructions.  Patient advised to call back or seek an in-person evaluation if the symptoms or condition worsens.  Duration of encounter: 39 minutes.  Total time on encounter, as per AMA guidelines included both the face-to-face and non-face-to-face time personally spent by the physician and/or other qualified health care  professional(s) on the day of the encounter (includes time in activities that require the physician or other qualified health care professional and does not include time in activities normally performed by clinical staff). Physician's time may include the following activities when performed: Preparing to see the patient (e.g., pre-charting review of records, searching for previously ordered imaging, lab work, and nerve conduction tests) Review of prior analgesic pharmacotherapies. Reviewing PMP Interpreting ordered tests (e.g., lab work, imaging, nerve conduction tests) Performing post-procedure evaluations, including interpretation of diagnostic procedures Obtaining and/or reviewing separately obtained history Performing a medically appropriate examination and/or evaluation Counseling and educating the patient/family/caregiver Ordering medications, tests, or procedures Referring and communicating with other health care professionals (when not separately reported) Documenting clinical information in the electronic or other health record Independently interpreting results (not separately reported) and communicating results to the patient/ family/caregiver Care coordination (not separately reported)  Note by: Oswaldo Done, MD Date: 12/05/2022; Time: 3:01 PM

## 2022-12-23 ENCOUNTER — Emergency Department

## 2022-12-23 ENCOUNTER — Emergency Department
Admission: EM | Admit: 2022-12-23 | Discharge: 2022-12-23 | Disposition: A | Discharge status: Home / Self Care | Attending: Emergency Medicine | Admitting: Emergency Medicine

## 2022-12-23 ENCOUNTER — Other Ambulatory Visit: Payer: Self-pay | Admitting: Dermatology

## 2022-12-23 ENCOUNTER — Other Ambulatory Visit: Payer: Self-pay

## 2022-12-23 DIAGNOSIS — M7918 Myalgia, other site: Secondary | ICD-10-CM | POA: Insufficient documentation

## 2022-12-23 DIAGNOSIS — Z1152 Encounter for screening for COVID-19: Secondary | ICD-10-CM | POA: Insufficient documentation

## 2022-12-23 DIAGNOSIS — R1032 Left lower quadrant pain: Secondary | ICD-10-CM | POA: Insufficient documentation

## 2022-12-23 DIAGNOSIS — L309 Dermatitis, unspecified: Secondary | ICD-10-CM

## 2022-12-23 DIAGNOSIS — R197 Diarrhea, unspecified: Secondary | ICD-10-CM | POA: Diagnosis present

## 2022-12-23 LAB — URINALYSIS, ROUTINE W REFLEX MICROSCOPIC
Bacteria, UA: NONE SEEN
Glucose, UA: NEGATIVE mg/dL
Ketones, ur: NEGATIVE mg/dL
Leukocytes,Ua: NEGATIVE
Nitrite: NEGATIVE
Protein, ur: 30 mg/dL — AB
Specific Gravity, Urine: >1.03 — ABNORMAL HIGH (ref 1.005–1.030)
pH: 5 (ref 5.0–8.0)

## 2022-12-23 LAB — CBC WITH DIFFERENTIAL/PLATELET
Abs Immature Granulocytes: 0.01 10*3/uL (ref 0.00–0.07)
Basophils Absolute: 0 10*3/uL (ref 0.0–0.1)
Basophils Relative: 0 %
Eosinophils Absolute: 0 10*3/uL (ref 0.0–0.5)
Eosinophils Relative: 0 %
HCT: 46.3 % — ABNORMAL HIGH (ref 36.0–46.0)
Hemoglobin: 15.3 g/dL — ABNORMAL HIGH (ref 12.0–15.0)
Immature Granulocytes: 0 %
Lymphocytes Relative: 12 %
Lymphs Abs: 0.8 10*3/uL (ref 0.7–4.0)
MCH: 29.2 pg (ref 26.0–34.0)
MCHC: 33 g/dL (ref 30.0–36.0)
MCV: 88.4 fL (ref 80.0–100.0)
Monocytes Absolute: 0.3 10*3/uL (ref 0.1–1.0)
Monocytes Relative: 5 %
Neutro Abs: 5.1 10*3/uL (ref 1.7–7.7)
Neutrophils Relative %: 83 %
Platelets: 212 10*3/uL (ref 150–400)
RBC: 5.24 MIL/uL — ABNORMAL HIGH (ref 3.87–5.11)
RDW: 13.4 % (ref 11.5–15.5)
WBC: 6.2 10*3/uL (ref 4.0–10.5)
nRBC: 0 % (ref 0.0–0.2)

## 2022-12-23 LAB — COMPREHENSIVE METABOLIC PANEL
ALT: 59 U/L — ABNORMAL HIGH (ref 0–44)
AST: 56 U/L — ABNORMAL HIGH (ref 15–41)
Albumin: 3.7 g/dL (ref 3.5–5.0)
Alkaline Phosphatase: 97 U/L (ref 38–126)
Anion gap: 9 (ref 5–15)
BUN: 13 mg/dL (ref 6–20)
CO2: 17 mmol/L — ABNORMAL LOW (ref 22–32)
Calcium: 8.8 mg/dL — ABNORMAL LOW (ref 8.9–10.3)
Chloride: 112 mmol/L — ABNORMAL HIGH (ref 98–111)
Creatinine, Ser: 0.93 mg/dL (ref 0.44–1.00)
GFR, Estimated: >60 mL/min (ref >60)
Glucose, Bld: 117 mg/dL — ABNORMAL HIGH (ref 70–99)
Potassium: 3 mmol/L — ABNORMAL LOW (ref 3.5–5.1)
Sodium: 138 mmol/L (ref 135–145)
Total Bilirubin: 0.8 mg/dL (ref 0.3–1.2)
Total Protein: 7 g/dL (ref 6.5–8.1)

## 2022-12-23 LAB — LIPASE, BLOOD: Lipase: 25 U/L (ref 11–51)

## 2022-12-23 LAB — MAGNESIUM: Magnesium: 2 mg/dL (ref 1.7–2.4)

## 2022-12-23 LAB — SARS CORONAVIRUS 2 BY RT PCR: SARS Coronavirus 2 by RT PCR: NEGATIVE

## 2022-12-23 MED ORDER — IOHEXOL 300 MG/ML  SOLN
100.0000 mL | Freq: Once | INTRAMUSCULAR | Status: AC | PRN
  Administered 2022-12-23: 100 mL via INTRAVENOUS

## 2022-12-23 MED ORDER — LACTATED RINGERS IV BOLUS
1000.0000 mL | Freq: Once | INTRAVENOUS | Status: AC
  Administered 2022-12-23: 1000 mL via INTRAVENOUS

## 2022-12-23 MED ORDER — ONDANSETRON 4 MG PO TBDP: 4.0000 mg | ORAL_TABLET | Freq: Three times a day (TID) | ORAL | 0 refills | Status: AC | PRN

## 2022-12-23 MED ORDER — KETOROLAC TROMETHAMINE 30 MG/ML IJ SOLN
15.0000 mg | Freq: Once | INTRAMUSCULAR | Status: AC
  Administered 2022-12-23: 15 mg via INTRAVENOUS
  Filled 2022-12-23: qty 1

## 2022-12-23 MED ORDER — ONDANSETRON HCL 4 MG/2ML IJ SOLN
4.0000 mg | Freq: Once | INTRAMUSCULAR | Status: AC
  Administered 2022-12-23: 4 mg via INTRAVENOUS
  Filled 2022-12-23: qty 2

## 2022-12-23 MED ORDER — POTASSIUM CHLORIDE CRYS ER 20 MEQ PO TBCR
40.0000 meq | EXTENDED_RELEASE_TABLET | Freq: Once | ORAL | Status: AC
  Administered 2022-12-23: 40 meq via ORAL
  Filled 2022-12-23: qty 2

## 2022-12-23 NOTE — ED Provider Notes (Signed)
[facility] Provider Note    Event Date/Time   First MD Initiated Contact with Patient 12/23/22 0340     (approximate)   History   flu symptoms    HPI  Grace Shepard is a 57 y.o. female who presents to the ED for evaluation of flu symptoms    I reviewed GI telemedicine visit from February.  History of adenomatous polyps of the colon, fibromyalgia  Patient presents to the ED for evaluation of about 24 hours of diarrhea and myalgias.  No emesis or particular abdominal pain, she reports diffuse myalgias.  She estimates 12-15 episodes of watery stool without melena or hematochezia.  Lesser urinary output without hematuria or dysuria.  No fevers.  No syncope or falls.  Physical Exam   Triage Vital Signs: ED Triage Vitals  Enc Vitals Group     BP      Pulse      Resp      Temp      Temp src      SpO2      Weight      Height      Head Circumference      Peak Flow      Pain Score      Pain Loc      Pain Edu?      Excl. in GC?     Most recent vital signs: Vitals:   12/23/22 0500 12/23/22 0652  BP: 115/68 112/70  Pulse: (!) 107 (!) 101  Resp: 19 20  Temp:    SpO2: 95% 99%    General: Awake, no distress.  Flat affect. CV:  Good peripheral perfusion.  Tachycardic and regular Resp:  Normal effort.  Abd:  No distention.  Mild LLQ tenderness without peritoneal features.  Upper abdomen is benign. MSK:  No deformity noted.  Neuro:  No focal deficits appreciated. Other:     ED Results / Procedures / Treatments   Labs (all labs ordered are listed, but only abnormal results are displayed) Labs Reviewed  CBC WITH DIFFERENTIAL/PLATELET - Abnormal; Notable for the following components:      Result Value   RBC 5.24 (*)    Hemoglobin 15.3 (*)    HCT 46.3 (*)    All other components within normal limits  COMPREHENSIVE METABOLIC PANEL - Abnormal; Notable for the following components:   Potassium 3.0 (*)    Chloride 112 (*)    CO2 17 (*)     Glucose, Bld 117 (*)    Calcium 8.8 (*)    AST 56 (*)    ALT 59 (*)    All other components within normal limits  URINALYSIS, ROUTINE W REFLEX MICROSCOPIC - Abnormal; Notable for the following components:   Specific Gravity, Urine >1.030 (*)    Hgb urine dipstick SMALL (*)    Bilirubin Urine SMALL (*)    Protein, ur 30 (*)    All other components within normal limits  SARS CORONAVIRUS 2 BY RT PCR  MAGNESIUM  LIPASE, BLOOD    EKG Sinus tachycardia with rate of 106 bpm.  Normal axis and intervals.  Wandering V3.  No clear signs of acute ischemia.  RADIOLOGY CT abdomen/pelvis interpreted by me without evidence of SBO  Official radiology report(s): CT ABDOMEN PELVIS W CONTRAST  Result Date: 12/23/2022 CLINICAL DATA:  Left lower quadrant pain and diarrhea. Generalized body aches for 19 hours. EXAM: CT ABDOMEN AND PELVIS WITH CONTRAST TECHNIQUE: Multidetector CT imaging of  the abdomen and pelvis was performed using the standard protocol following bolus administration of intravenous contrast. RADIATION DOSE REDUCTION: This exam was performed according to the departmental dose-optimization program which includes automated exposure control, adjustment of the mA and/or kV according to patient size and/or use of iterative reconstruction technique. CONTRAST:  OMNIPAQUE IOHEXOL 300 MG/ML  SOLN COMPARISON:  05/28/2015 FINDINGS: Lower chest:  No contributory findings. Hepatobiliary: No focal liver abnormality.Cholecystectomy. No biliary dilatation. Pancreas: Unremarkable. Spleen: Unremarkable. Adrenals/Urinary Tract: Negative adrenals. No hydronephrosis or stone. 7 mm simple cyst at the interpolar right kidney. No follow-up imaging is recommended. Unremarkable bladder. Stomach/Bowel: Distal colonic fluid levels suggesting diarrheal illness. No bowel wall thickening. No appendicitis. Vascular/Lymphatic: No acute vascular abnormality. No mass or adenopathy. Reproductive:No pathologic findings. Other:  No ascites or pneumoperitoneum. Musculoskeletal: No acute abnormalities. IMPRESSION: Colonic fluid levels suggesting diarrheal illness. No bowel wall thickening. Electronically Signed   By: Tiburcio Pea M.D.   On: 12/23/2022 06:20    PROCEDURES and INTERVENTIONS:  Procedures  Medications  potassium chloride SA (KLOR-CON M) CR tablet 40 mEq (has no administration in time range)  lactated ringers bolus 1,000 mL (0 mLs Intravenous Stopped 12/23/22 0500)  ondansetron (ZOFRAN) injection 4 mg (4 mg Intravenous Given 12/23/22 0402)  ketorolac (TORADOL) 30 MG/ML injection 15 mg (15 mg Intravenous Given 12/23/22 0541)  lactated ringers bolus 1,000 mL (1,000 mLs Intravenous New Bag/Given 12/23/22 0541)  iohexol (OMNIPAQUE) 300 MG/ML solution 100 mL (100 mLs Intravenous Contrast Given 12/23/22 0556)     IMPRESSION / MDM / ASSESSMENT AND PLAN / ED COURSE  I reviewed the triage vital signs and the nursing notes.  Differential diagnosis includes, but is not limited to, SBO, diverticulitis, cystitis, dehydration, gastroenteritis  {Patient presents with symptoms of an acute illness or injury that is potentially life-threatening.  57 year old woman presents to the ED with 24 hours of diarrhea suitable for outpatient management with antiemetics.  Mild LLQ tenderness without peritoneal features.  Abdomen is otherwise benign.  She is initially tachycardic in a sinus tach, improving with fluid resuscitation.  Remained stable.  Blood work with hypokalemia that is replaced orally.  None anion gap metabolic acidosis for which she received 2 L of LR.  No leukocytosis or signs of sepsis.  Normal lipase and magnesium level.  Urine without infectious features.  CT without obstructive pathology or signs of enteritis/colitis.  Improving symptoms and she is tolerating p.o. intake.  Suitable for outpatient management.  Clinical Course as of 12/23/22 0711  Mon Dec 23, 2022  0703 Reassessed.  Patient was feeling better.  She  is ambulating back to the from the toilet when I go reassess her.  We discussed workup so far and plan of care.  She reports feeling better after IV rehydration and is asking for something to drink.  Discussed likely etiology of her symptoms and care at home.  She is comfortable going home.  We discussed antiemetics, fluid rehydration's and return precautions. [DS]    Clinical Course User Index [DS] Delton Prairie, MD     FINAL CLINICAL IMPRESSION(S) / ED DIAGNOSES   Final diagnoses:  Diarrhea, unspecified type     Rx / DC Orders   ED Discharge Orders          Ordered    ondansetron (ZOFRAN-ODT) 4 MG disintegrating tablet  Every 8 hours PRN        12/23/22 0703             Note:  This  document was prepared using Conservation officer, historic buildings and may include unintentional dictation errors.    Delton Prairie, MD 12/23/22 [phone]

## 2022-12-23 NOTE — ED Notes (Signed)
Pt was given pants to change into--she states that she had an accident when the CT scan was done.  Husband remains at bedside

## 2022-12-23 NOTE — ED Triage Notes (Signed)
Pt reports generalized body aches for the last 19 hours. Reports decreased appetite and oral intake. Reports diarrhea as well.  Reports subjective fever and chills. Reports increased fatigue. Denies n/v. Pt ambulatory to triage. Alert and oriented. Breathing unlabored with symmetric chest rise and fall.

## 2022-12-23 NOTE — ED Notes (Signed)
ED Provider at bedside. 

## 2022-12-23 NOTE — Discharge Instructions (Addendum)
Use the Zofran as needed for any further nausea or upset stomach.  Please take Tylenol and ibuprofen/Advil for your pain.  It is safe to take them together, or to alternate them every few hours.  Take up to 1000mg  of Tylenol at a time, up to 4 times per day.  Do not take more than 4000 mg of Tylenol in 24 hours.  For ibuprofen, take 400-600 mg, 3 - 4 times per day.  If you develop any fevers, severe pain or worsening symptoms despite these measures then please return to the ED.

## 2022-12-24 ENCOUNTER — Telehealth: Payer: Self-pay

## 2022-12-24 NOTE — Transitions of Care (Post Inpatient/ED Visit) (Signed)
12/24/2022  Name: Grace Shepard MRN: [id number] DOB: 1966/02/25  Today's TOC FU Call Status: Today's TOC FU Call Status:: Successful TOC FU Call Competed TOC FU Call Complete Date: 12/24/22  Transition Care Management Follow-up Telephone Call Date of Discharge: 12/23/22 Discharge Facility: [facility]) Type of Discharge: Emergency Department How have you been since you were released from the hospital?: Better Any questions or concerns?: No  Items Reviewed: Did you receive and understand the discharge instructions provided?: Yes Medications obtained,verified, and reconciled?: Yes (Medications Reviewed) Any new allergies since your discharge?: No Dietary orders reviewed?: NA  Medications Reviewed Today: Medications Reviewed Today     Reviewed by Delano Metz, MD (Physician) on 12/05/22 at 1501  Med List Status: <None>   Medication Order Taking? Sig Documenting Provider Last Dose Status Informant  acetaminophen (TYLENOL) 325 MG tablet [id number] Yes Take by mouth. [provider] Taking Active   albuterol (VENTOLIN HFA) 108 (90 Base) MCG/ACT inhaler [id number] Yes USE 1 INHALATION EVERY 4 HOURS AS NEEDED FOR WHEEZING OR SHORTNESS OF BREATH Baity, Salvadore Oxford, NP Taking Active   baclofen (LIORESAL) 10 MG tablet [id number] Yes Take 1 tablet (10 mg total) by mouth 3 (three) times daily as needed for muscle spasms. Lorre Munroe, NP Taking Active   cetirizine (ZYRTEC) 10 MG tablet [id number] Yes Take 10 mg by mouth daily. [provider] Taking Active   Cholecalciferol (VITAMIN D3) 125 MCG (5000 UT) CAPS [id number] Yes Take 4,000 Units by mouth. [provider] Taking Active   clobetasol cream (TEMOVATE) 0.05 % [id number] Yes Apply to affected areas hands, fingers twice daily until improved. Avoid face, groin, underarms. Willeen Niece, MD Taking Active   divalproex (DEPAKOTE) 250 MG DR tablet [id number] Yes Take by mouth. [provider] Taking Active   estradiol (ESTRACE) 0.1 MG/GM vaginal cream [id number] Yes Place 1 Applicatorful vaginally 3 (three) times a week. Lorre Munroe, NP Taking Active   finasteride (PROSCAR) 5 MG tablet [id number] Yes Take 1 tablet (5 mg total) by mouth daily. Willeen Niece, MD Taking Active   levothyroxine (SYNTHROID) 75 MCG tablet [id number] Yes Take 1 tablet (75 mcg total) by mouth daily. Lorre Munroe, NP Taking Active   minoxidil (LONITEN) 2.5 MG tablet [id number] Yes Take 1 tablet (2.5 mg total) by mouth daily. Willeen Niece, MD Taking Active   montelukast (SINGULAIR) 10 MG tablet [id number] Yes TAKE 1 TABLET AT BEDTIME Lorre Munroe, NP Taking Active   pantoprazole (PROTONIX) 40 MG tablet [id number] Yes Take 1 tablet (40 mg total) by mouth daily.  Patient taking differently: Take 40 mg by mouth 2 (two) times daily.   Lorre Munroe, NP Taking Active   pregabalin (LYRICA) 75 MG capsule [id number] Yes Take 1 capsule (75 mg total) by mouth 2 (two) times daily. Lorre Munroe, NP Taking Active   rosuvastatin (CRESTOR) 10 MG tablet [id number] Yes  [provider] Taking Active   topiramate (TOPAMAX) 100 MG tablet [id number] Yes Take 100 mg by mouth daily. [provider] Taking Active   traZODone (DESYREL) 50 MG tablet [id number] Yes Take 1-2 tablets (50-100 mg total) by mouth at bedtime as needed for sleep.  Patient taking differently: Take 50 mg by mouth at bedtime as needed for sleep.   Lorre Munroe, NP Taking Active   Tretinoin (ALTRENO) 0.05 % LOTN [id number] Yes Apply to face every night as tolerated. Willeen Niece, MD Taking Active   triamcinolone cream (KENALOG) 0.1 %  [id number] Yes APPLY 1 APPLICATION TOPICALLY TWICE A DAY AS NEEDED. AVOID FACE, GROIN, Carolyne Fiscal, NP Taking Active   vitamin B-12 (CYANOCOBALAMIN) 1000 MCG tablet [id number] Yes Take 50 mcg by mouth every other day.  [provider] Taking Active Self           Med Note  (GENTLE, JEB C   Sun Jun 16, 2016  7:22 PM)    vitamin E 180 MG (400 UNITS) capsule [id number] Yes Take 400 Units by mouth daily. [provider] Taking Active             Home Care and Equipment/Supplies: Were Home Health Services Ordered?: NA Any new equipment or medical supplies ordered?: NA  Functional Questionnaire: Do you need assistance with bathing/showering or dressing?: No Do you need assistance with meal preparation?: No Do you need assistance with eating?: No Do you have difficulty maintaining continence: No Do you need assistance with getting out of bed/getting out of a chair/moving?: No Do you have difficulty managing or taking your medications?: No  Follow up appointments reviewed: PCP Follow-up appointment confirmed?: NA Specialist Hospital Follow-up appointment confirmed?: NA Do you need transportation to your follow-up appointment?: No Do you understand care options if your condition(s) worsen?: Yes-patient verbalized understanding    Oneal Grout, [facility]) Lovie Macadamia [phone]

## 2023-01-10 ENCOUNTER — Other Ambulatory Visit: Payer: Self-pay | Admitting: Dermatology

## 2023-01-10 DIAGNOSIS — L649 Androgenic alopecia, unspecified: Secondary | ICD-10-CM

## 2023-03-06 ENCOUNTER — Other Ambulatory Visit: Payer: Self-pay | Admitting: Internal Medicine

## 2023-03-06 DIAGNOSIS — G8929 Other chronic pain: Secondary | ICD-10-CM

## 2023-03-11 ENCOUNTER — Ambulatory Visit
Admission: RE | Admit: 2023-03-11 | Discharge: 2023-03-11 | Disposition: A | Discharge status: Home / Self Care | Source: Ambulatory Visit | Attending: Internal Medicine | Admitting: Internal Medicine

## 2023-03-11 DIAGNOSIS — M5440 Lumbago with sciatica, unspecified side: Secondary | ICD-10-CM | POA: Diagnosis present

## 2023-03-11 DIAGNOSIS — G8929 Other chronic pain: Secondary | ICD-10-CM | POA: Insufficient documentation

## 2023-03-24 ENCOUNTER — Ambulatory Visit: Admitting: Dermatology

## 2023-03-24 VITALS — BP 130/89 | HR 79

## 2023-03-24 DIAGNOSIS — L821 Other seborrheic keratosis: Secondary | ICD-10-CM | POA: Diagnosis not present

## 2023-03-24 DIAGNOSIS — L820 Inflamed seborrheic keratosis: Secondary | ICD-10-CM

## 2023-03-24 DIAGNOSIS — L649 Androgenic alopecia, unspecified: Secondary | ICD-10-CM

## 2023-03-24 DIAGNOSIS — Z79899 Other long term (current) drug therapy: Secondary | ICD-10-CM

## 2023-03-24 MED ORDER — FINASTERIDE 5 MG PO TABS
5.0000 mg | ORAL_TABLET | Freq: Every day | ORAL | 2 refills | Status: DC
Start: 2023-03-24 — End: 2023-09-30

## 2023-03-24 MED ORDER — MINOXIDIL 2.5 MG PO TABS
2.5000 mg | ORAL_TABLET | Freq: Every day | ORAL | 11 refills | Status: DC
Start: 2023-03-24 — End: 2023-09-30

## 2023-03-24 NOTE — Progress Notes (Signed)
Follow-Up Visit   Subjective  Grace Shepard is a 57 y.o. female who presents for the following: 4 months f/u. 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 No side effects.  She tried to increase the minoxidil and noticed increase hair shedding, so she went back to 1/2 tab. The patient has spots, moles and lesions to be evaluated, some may be new or changing and the patient may have concern these could be cancer. Several itchy spots on lower legs to check.   The following portions of the chart were reviewed this encounter and updated as appropriate: medications, allergies, medical history  Review of Systems:  No other skin or systemic complaints except as noted in HPI or Assessment and Plan.  Objective  Well appearing patient in no apparent distress; mood and affect are within normal limits.  . A focused examination was performed of the following areas:face,scalp,hands    Relevant exam findings are noted in the Assessment and Plan.  left ankle x 1, left lower calf x 1, right lower calf x 1 (3) Stuck-on, waxy, tan-brown papules -- Discussed benign etiology and prognosis.     Assessment & Plan   Inflamed seborrheic keratosis (3) left ankle x 1, left lower calf x 1, right lower calf x 1  Symptomatic, irritating, patient would like treated.   Destruction of lesion - left ankle x 1, left lower calf x 1, right lower calf x 1 (3)  Destruction method: cryotherapy   Informed consent: discussed and consent obtained   Lesion destroyed using liquid nitrogen: Yes   Region frozen until ice ball extended beyond lesion: Yes   Outcome: patient tolerated procedure well with no complications   Post-procedure details: wound care instructions given   Additional details:  Prior to procedure, discussed risks of blister formation, small wound, skin dyspigmentation, or rare scar following  cryotherapy. Recommend Vaseline ointment to treated areas while healing.   Androgenetic alopecia  Related Medications minoxidil (LONITEN) 2.5 MG tablet Take 1 tablet (2.5 mg total) by mouth daily.  finasteride (PROSCAR) 5 MG tablet Take 1 tablet (5 mg total) by mouth daily.   SEBORRHEIC KERATOSIS - Stuck-on, waxy, tan-brown papules and/or plaques  - Benign-appearing - Discussed benign etiology and prognosis. - Observe - Call for any changes    Androgenetic alopecia Scalp BL Temporal hairline with mild thinning.  Improvement noted when compared to baseline photos.     Chronic and persistent condition with duration or expected duration over one year. Condition is improving with treatment but not currently at goal.     Has improved some while taking finasteride and minoxidil    Female Androgenic Alopecia is a chronic condition related to genetics and/or hormonal changes.  In women androgenetic alopecia is commonly associated with menopause but may occur any time after puberty.  It causes hair thinning primarily on the crown with widening of the part and temporal hairline recession.  Can use OTC Rogaine (minoxidil) 5% solution/foam as directed.  Oral treatments in female patients who have no contraindication may include : - Low dose oral minoxidil 1.25 - 5mg  daily - Spironolactone 50 - 100mg  bid - Finasteride 2.5 - 5 mg daily Adjunctive therapies include: - Low Level Laser Light Therapy (LLLT) - Platelet-rich plasma injections (PRP) - Hair Transplants or scalp reduction     Continue finasteride 5 MG take 1 tablet po QD   Cont minoxidil  2.5 MG take 1/2 tablet po every day, may increase to 1 tablet as tolerated   Doses of minoxidil for hair loss are considered 'low dose'. This is because the doses used for hair loss are much lower than the doses which are used for conditions such as high blood pressure (hypertension). The doses used for hypertension are 10-40mg  per day.  Side  effects are uncommon at the low doses (up to 2.5 mg/day) used to treat hair loss. Potential side effects, more commonly seen at higher doses, include: Increase in hair growth (hypertrichosis) elsewhere on face and body Temporary hair shedding upon starting medication which may last up to 4 weeks Ankle swelling, fluid retention, rapid weight gain more than 5 pounds Low blood pressure and feeling lightheaded or dizzy when standing up quickly Fast or irregular heartbeat Headaches  Long term medication management.  Patient is using long term (months to years) prescription medication  to control their dermatologic condition.  These medications require periodic monitoring to evaluate for efficacy and side effects and may require periodic laboratory monitoring.   Return in about 6 months (around 09/24/2023) for Alopecia .  I, Angelique Holm, CMA, am acting as scribe for Willeen Niece, MD .   Documentation: I have reviewed the above documentation for accuracy and completeness, and I agree with the above.  Willeen Niece, MD

## 2023-03-24 NOTE — Patient Instructions (Addendum)

## 2023-04-10 ENCOUNTER — Ambulatory Visit: Attending: Neurology

## 2023-04-15 ENCOUNTER — Ambulatory Visit

## 2023-04-17 ENCOUNTER — Encounter

## 2023-05-01 ENCOUNTER — Encounter

## 2023-05-13 ENCOUNTER — Encounter

## 2023-05-15 ENCOUNTER — Encounter

## 2023-05-15 ENCOUNTER — Ambulatory Visit: Attending: Neurology

## 2023-05-15 DIAGNOSIS — M6281 Muscle weakness (generalized): Secondary | ICD-10-CM | POA: Diagnosis present

## 2023-05-15 DIAGNOSIS — G8929 Other chronic pain: Secondary | ICD-10-CM | POA: Diagnosis present

## 2023-05-15 DIAGNOSIS — M545 Low back pain, unspecified: Secondary | ICD-10-CM | POA: Insufficient documentation

## 2023-05-15 DIAGNOSIS — M5459 Other low back pain: Secondary | ICD-10-CM | POA: Diagnosis present

## 2023-05-15 NOTE — Therapy (Signed)
OUTPATIENT PHYSICAL THERAPY THORACOLUMBAR EVALUATION   Patient Name: Grace Shepard MRN: [id number] DOB:Dec 26, 1965, 57 y.o., female Today's Date: 05/15/2023  END OF SESSION:  PT End of Session - 05/15/23 0959     Visit Number 1    Number of Visits 17    Date for PT Re-Evaluation 07/11/23    PT Start Time 1000    PT Stop Time 1032    PT Time Calculation (min) 32 min    Activity Tolerance Patient tolerated treatment well    Behavior During Therapy [facility] for tasks assessed/performed             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   Hyperlipidemia    Hypothyroidism    Ischemic colitis (HCC)    Migraine    Thyroid disease    UTI (lower urinary tract infection)    Past Surgical History:  Procedure Laterality Date   CHOLECYSTECTOMY N/A 02/08/2022   Procedure: LAPAROSCOPIC CHOLECYSTECTOMY WITH INTRAOPERATIVE CHOLANGIOGRAM;  Surgeon: Earline Mayotte, MD;  Location: [facility] ORS;  Service: General;  Laterality: N/A;  Sonda Rumble, RNFA to assist   TUBAL LIGATION     Patient Active Problem List   Diagnosis Date Noted   DDD (degenerative disc disease), lumbosacral 11/21/2022   Lumbar facet joint pain 11/21/2022   Facet joint sclerosis (Lumbar) 11/21/2022   Osteoarthritis of facet joint of lumbar spine 11/21/2022   Chronic low back pain (Right) w/o sciatica 11/06/2022   Low back pain of over 3 months duration 11/06/2022   Abnormal NCS (nerve conduction studies) (02/12/2016) 11/06/2022   Spondylosis without myelopathy or radiculopathy, lumbosacral region 11/06/2022   Lichen sclerosus 09/24/2021   Class 1 obesity due to excess calories with body mass index (BMI) of 30.0 to 30.9 in adult 02/19/2021   Eczema 12/11/2020   Postmenopausal atrophic  vaginitis 12/11/2020   Other spondylosis, sacral and sacrococcygeal region 02/08/2020   Enthesopathy of hip region (Left) 02/08/2020   Greater trochanteric bursitis of hips (Bilateral) 02/08/2020   Chronic sacroiliac joint pain (Right) 02/07/2020   Sacroiliac joint dysfunction (Bilateral) (R>L) 02/07/2020   Trochanteric bursitis of hip (Bilateral) 02/07/2020   Chronic hip pain (Left) 02/07/2020   Trochanteric bursitis, right hip 02/07/2020   Chronic pain syndrome 01/17/2020   Chronic lower extremity pain (2ry area of Pain) (Bilateral) (R>L) 01/17/2020   Chronic hip pain (Bilateral) (R>L) 01/17/2020   Chronic upper extremity pain (Bilateral) 01/17/2020   Cervicalgia (Right) 01/17/2020   Chronic occipital headache (Right) 01/17/2020   Cervicogenic headache (Right) 01/17/2020   Occipital neuralgia (lesser occipital nerve) (Right) 01/17/2020   Cervical facet syndrome (Right) 01/17/2020   Cervico-occipital neuralgia (Right) 01/17/2020   Chronic sacroiliac joint pain (Bilateral) (R>L) 01/17/2020   Lumbar facet syndrome (Bilateral) (R>L) 01/17/2020   HLD (hyperlipidemia) 03/04/2019   Anxiety and depression 05/20/2016   GERD (gastroesophageal reflux disease) 06/15/2015   Chronic low back pain (1ry area of Pain) (Bilateral) (R>L) w/o sciatica 06/02/2015   Insomnia 05/22/2015   Fibromyalgia 04/21/2015   Migraines 04/21/2015   Acquired hypothyroidism 12/19/2014    PCP: Enid Baas, Judie Petit   REFERRING PROVIDER: Morene Crocker, MD   REFERRING DIAG: M54.50 (ICD-10-CM) - Low back  pain  Rationale for Evaluation and Treatment: Rehabilitation  THERAPY DIAG:  Other low back pain - Plan: PT plan of care cert/re-cert  Muscle weakness (generalized) - Plan: PT plan of care cert/re-cert  ONSET DATE: 2 weeks ago  SUBJECTIVE:                                                                                                                                                                                            SUBJECTIVE STATEMENT: Low back pain (mainly on R side but has L low back pain at times). 3-4/10 back pain currently (pt sitting on a chair). 8/10 at worst for the past 3 months.      PERTINENT HISTORY:  Low back  pain.  R low back pain, has continuous hurt in r low back . Has been keeping her 65.8 month old grand daughter and has been flaring. The flare up started within the last 2 weeks. No LE radiating symptoms.  Pain has been the same since 2 weeks ago when the flare-up occurred. Had to keep her grand-daughter 3-4 days a week and has taken its toll.   No HTN per pt.   PAIN:  Are you having pain? Yes: NPRS scale: 4/10 Pain location: R low back > L Pain description: achy, sore Aggravating factors: lifting her 66.67 month old grand daughter (20 lbs), sleeping (any position), standing and doing dishes  Relieving factors: Heat, repositioning  PRECAUTIONS: None  RED FLAGS: Bowel or bladder incontinence: No and Cauda equina syndrome: No   WEIGHT BEARING RESTRICTIONS: No  FALLS:  Has patient fallen in last 6 months? No  LIVING ENVIRONMENT: Lives with: lives with their spouse and lives with their son Lives in: House/apartment Stairs: Yes: External: 7 steps; can reach both  Has following equipment at home: Ramped entry  OCCUPATION: substitute teacher  PLOF: Independent  PATIENT GOALS: Strengthen her back, figure out what is best for her arthritis  NEXT MD VISIT: November 2024  OBJECTIVE:  Note: Objective measures were completed at Evaluation unless otherwise noted.  DIAGNOSTIC FINDINGS:   MR LUMBAR SPINE WO CONTRAST 03/11/2023   CLINICAL DATA:  Chronic, progressively worsening low back, hip, and leg pain, worse on the right. No prior surgery.   EXAM: MRI LUMBAR SPINE WITHOUT CONTRAST   TECHNIQUE: Multiplanar, multisequence MR imaging of the lumbar spine was performed. No intravenous contrast was administered.   COMPARISON:  CT abdomen pelvis dated  Dec 23, 2022.   FINDINGS: Segmentation: Hypoplastic ribs at T12. Partial sacralization of L5.   Alignment:  Physiologic.   Vertebrae: No fracture, evidence of discitis, or bone lesion. Mild right  facet marrow edema at L4-L5.   Conus medullaris and cauda equina: Conus extends to the T12-L1 level. Conus and cauda equina appear normal.   Paraspinal and other soft tissues: Negative.   Disc levels:   T11-T12 to L1-L2: Negative.   L2-L3:  Disc desiccation and minimal disc bulging.  No stenosis.   L3-L4:  Disc desiccation and minimal disc bulging.  No stenosis.   L4-L5: Mild disc bulging. Moderate right and mild left facet arthropathy. No stenosis.   L5-S1:  Transitional level.  Negative.   IMPRESSION: 1. Transitional anatomy. Correlation with radiographs is recommended prior to any operative intervention. 2. Mild degenerative disc disease from L2-L3 through L4-L5. No stenosis or impingement. 3. Moderate right facet arthropathy at L4-L5 with mild degenerative marrow edema, which can be a source of pain.     Electronically Signed   By: Obie Dredge M.D.   On: 03/21/2023 08:41     DG Lumbar Spine Complete W/Bend 11/06/2022  CLINICAL DATA:  Low back pain for 3 months.   EXAM: LUMBAR SPINE - COMPLETE WITH BENDING VIEWS   COMPARISON:  January 17, 2020   FINDINGS: There is no evidence of lumbar spine fracture. Minimal curvature of spine. Mild decreased intervertebral space is identified in the lower lumbar spine with facet joint sclerosis.   IMPRESSION: Mild degenerative joint changes of lower lumbar spine.     Electronically Signed   By: Sherian Rein M.D.   On: 11/06/2022 10:11    PATIENT SURVEYS:  FOTO Lumbar Spine FOTO 57 (05/15/2023)  SCREENING FOR RED FLAGS: Bowel or bladder incontinence: No Cauda equina syndrome: No    COGNITION: Overall cognitive status: Within functional limits for tasks assessed     SENSATION:   MUSCLE LENGTH:   POSTURE:   forward neck, B protracted shoulders, R lateral shift, movement preference around L2/3 area. R greater trochanter and knee higher   Decreased back pain with R lateral shift correction  PALPATION: R ASIS more anterior and inferior   LUMBAR ROM:   AROM eval  Flexion Full, with pain  Extension WFL with R low back pain  Right lateral flexion WFL with R low back pain  Left lateral flexion WFL with slight R low back pain and stretch  Right rotation full  Left rotation full with R low back pain   (Blank rows = not tested)  LOWER EXTREMITY ROM:     Passive  Right eval Left eval  Hip flexion    Hip extension    Hip abduction    Hip adduction    Hip internal rotation (at 90/90) 26 20  Hip external rotation    Knee flexion    Knee extension    Ankle dorsiflexion    Ankle plantarflexion    Ankle inversion    Ankle eversion     (Blank rows = not tested)  LOWER EXTREMITY MMT:    MMT Right eval Left eval  Hip flexion    Hip extension 4- with R low back pain 4  Hip abduction 4 with R low back pain 4  Hip adduction    Hip internal rotation    Hip external rotation    Knee flexion    Knee extension    Ankle dorsiflexion    Ankle plantarflexion    Ankle inversion    Ankle eversion     (Blank rows = not tested)  LUMBAR SPECIAL TESTS:  (+) Eli test R and L (+) Long sit test suggesting anterior  nutation of R innominate.   FUNCTIONAL TESTS:    GAIT:     TODAY'S TREATMENT:                                                                                                                              DATE: 05/15/2023    Therapeutic exercise  Supine SKTC   R 30 seconds x 3  Pt was recommended to use an ice pack for her low back (with cloth barrier) 15-20 minutes at a time for 4-5 times daily to help decrease pain. Pt verbalized understanding.    Improved exercise technique, movement at target joints, use of target muscles after mod verbal, visual, tactile cues.    Pt states back feels better after session.     PATIENT EDUCATION:  Education details: there-ex, HEP, POC Person educated: Patient Education method: Explanation, Demonstration, Tactile cues, Verbal cues, and Handouts Education comprehension: verbalized understanding and returned demonstration  HOME EXERCISE PROGRAM: Access Code: [email or web address] URL: [email or web address] Date: 05/15/2023 Prepared by: Loralyn Freshwater  Exercises - Hooklying Single Knee to Chest Stretch  - 3 x daily - 7 x weekly - 1 sets - 5 reps - 30 seconds hold    ASSESSMENT:  CLINICAL IMPRESSION: Patient is a 57 y.o. female who was seen today for physical therapy evaluation and treatment for chronic low back pain. She also presents with altered posture, reproduction of symptoms with lumbar AROM, decreased trunk and glute strength, positive special test suggesting lumbopelvic involvement, and difficulty performing tasks which involve bending, lifting and standing secondary to pain. Pt will benefit from skilled physical therapy services to address the aforementioned deficits.      OBJECTIVE IMPAIRMENTS: decreased activity tolerance, decreased strength, improper body mechanics, postural dysfunction, and pain.   ACTIVITY LIMITATIONS: carrying, lifting, bending, standing, sleeping, and caring for others  PARTICIPATION LIMITATIONS:   PERSONAL FACTORS: Fitness, Past/current experiences, Time since onset of injury/illness/exacerbation, and 3+ comorbidities: anxiety, chronic pain syndrome, depression, fibromyalgia  are also affecting patient's functional outcome.   REHAB POTENTIAL: Fair    CLINICAL DECISION MAKING: Stable/uncomplicated  EVALUATION COMPLEXITY: Low   GOALS: Goals reviewed with patient? Yes  SHORT TERM GOALS: Target date: 06/06/2023  Pt will be independent with her initial HEP to decrease pain, improve strength and function.  Baseline: Pt has started her initial HEP (05/15/2023) Goal  status: INITIAL   LONG TERM GOALS: Target date: 07/11/2023  Pt will have a decrease in low back pain to 3/10 or less at worst to promote ability to perform tasks which involve bending over, lifting, and standing more comfortably for her back.  Baseline: 8/10 at worst for the past 3 months (05/15/2023) Goal status: INITIAL  2.  Pt will improve her B hip extension and abduction strength by at least 1/2 MMT grade to promote ability to perform standing tasks more comfortably for her back.  Baseline:  MMT Right eval Left eval  Hip extension 4- with R low back  pain 4  Hip abduction 4 with R low back pain 4  (05/15/2023)   Goal status: INITIAL  3.  Pt will improve her lumbar spine FOTO score by at least 10 points as a demonstration of improved function.  Baseline: Lumbar Spine FOTO 57 (05/15/2023) Goal status: INITIAL   PLAN:  PT FREQUENCY: 2x/week  PT DURATION: 8 weeks  PLANNED INTERVENTIONS: Therapeutic exercises, Therapeutic activity, Neuromuscular re-education, Balance training, Gait training, Patient/Family education, Joint mobilization, Joint manipulation, Aquatic Therapy, Dry Needling, Electrical stimulation, Spinal manipulation, Spinal mobilization, Ionotophoresis 4mg /ml Dexamethasone, Manual therapy, and Re-evaluation.  PLAN FOR NEXT SESSION: Posture, trunk and glute med, max strength, manual techniques, modalities PRN     Ancel Easler, PT, DPT 05/15/2023, 11:41 AM

## 2023-05-19 ENCOUNTER — Encounter

## 2023-05-19 ENCOUNTER — Ambulatory Visit

## 2023-05-19 DIAGNOSIS — G8929 Other chronic pain: Secondary | ICD-10-CM

## 2023-05-19 DIAGNOSIS — M5459 Other low back pain: Secondary | ICD-10-CM | POA: Diagnosis not present

## 2023-05-19 DIAGNOSIS — M6281 Muscle weakness (generalized): Secondary | ICD-10-CM

## 2023-05-19 DIAGNOSIS — M545 Low back pain, unspecified: Secondary | ICD-10-CM

## 2023-05-19 NOTE — Therapy (Signed)
OUTPATIENT PHYSICAL THERAPY Treatment    Patient Name: Grace Shepard MRN: [id number] DOB:1966/02/26, 57 y.o., female Today's Date: 05/19/2023  END OF SESSION:  PT End of Session - 05/19/23 0949     Visit Number 2    Number of Visits 17    Date for PT Re-Evaluation 07/11/23    PT Start Time 0950    PT Stop Time 1029    PT Time Calculation (min) 39 min    Activity Tolerance Patient tolerated treatment well    Behavior During Therapy [facility] for tasks assessed/performed              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   Hyperlipidemia    Hypothyroidism    Ischemic colitis (HCC)    Migraine    Thyroid disease    UTI (lower urinary tract infection)    Past Surgical History:  Procedure Laterality Date   CHOLECYSTECTOMY N/A 02/08/2022   Procedure: LAPAROSCOPIC CHOLECYSTECTOMY WITH INTRAOPERATIVE CHOLANGIOGRAM;  Surgeon: Earline Mayotte, MD;  Location: [facility] ORS;  Service: General;  Laterality: N/A;  Sonda Rumble, RNFA to assist   TUBAL LIGATION     Patient Active Problem List   Diagnosis Date Noted   DDD (degenerative disc disease), lumbosacral 11/21/2022   Lumbar facet joint pain 11/21/2022   Facet joint sclerosis (Lumbar) 11/21/2022   Osteoarthritis of facet joint of lumbar spine 11/21/2022   Chronic low back pain (Right) w/o sciatica 11/06/2022   Low back pain of over 3 months duration 11/06/2022   Abnormal NCS (nerve conduction studies) (02/12/2016) 11/06/2022   Spondylosis without myelopathy or radiculopathy, lumbosacral region 11/06/2022   Lichen sclerosus 09/24/2021   Class 1 obesity due to excess calories with body mass index (BMI) of 30.0 to 30.9 in adult 02/19/2021   Eczema 12/11/2020   Postmenopausal atrophic vaginitis  12/11/2020   Other spondylosis, sacral and sacrococcygeal region 02/08/2020   Enthesopathy of hip region (Left) 02/08/2020   Greater trochanteric bursitis of hips (Bilateral) 02/08/2020   Chronic sacroiliac joint pain (Right) 02/07/2020   Sacroiliac joint dysfunction (Bilateral) (R>L) 02/07/2020   Trochanteric bursitis of hip (Bilateral) 02/07/2020   Chronic hip pain (Left) 02/07/2020   Trochanteric bursitis, right hip 02/07/2020   Chronic pain syndrome 01/17/2020   Chronic lower extremity pain (2ry area of Pain) (Bilateral) (R>L) 01/17/2020   Chronic hip pain (Bilateral) (R>L) 01/17/2020   Chronic upper extremity pain (Bilateral) 01/17/2020   Cervicalgia (Right) 01/17/2020   Chronic occipital headache (Right) 01/17/2020   Cervicogenic headache (Right) 01/17/2020   Occipital neuralgia (lesser occipital nerve) (Right) 01/17/2020   Cervical facet syndrome (Right) 01/17/2020   Cervico-occipital neuralgia (Right) 01/17/2020   Chronic sacroiliac joint pain (Bilateral) (R>L) 01/17/2020   Lumbar facet syndrome (Bilateral) (R>L) 01/17/2020   HLD (hyperlipidemia) 03/04/2019   Anxiety and depression 05/20/2016   GERD (gastroesophageal reflux disease) 06/15/2015   Chronic low back pain (1ry area of Pain) (Bilateral) (R>L) w/o sciatica 06/02/2015   Insomnia 05/22/2015   Fibromyalgia 04/21/2015   Migraines 04/21/2015   Acquired hypothyroidism 12/19/2014    PCP: Enid Baas, Judie Petit   REFERRING PROVIDER: Morene Crocker, MD   REFERRING DIAG: M54.50 (ICD-10-CM) - Low  back pain  Rationale for Evaluation and Treatment: Rehabilitation  THERAPY DIAG:  Muscle weakness (generalized)  Other low back pain  Chronic midline low back pain without sciatica  ONSET DATE: 2 weeks ago  SUBJECTIVE:                                                                                                                                                                                           SUBJECTIVE  STATEMENT: Low back is pretty good right now, only slight pain, 0-1/10 low back pain currently. The ice helps. Also took ibuprofen yesterday and just now. Going to have her grand daughter this afternoon.    PERTINENT HISTORY:  Low back  pain.  R low back pain, has continuous hurt in r low back . Has been keeping her 31.1 month old grand daughter and has been flaring. The flare up started within the last 2 weeks. No LE radiating symptoms.  Pain has been the same since 2 weeks ago when the flare-up occurred. Had to keep her grand-daughter 3-4 days a week and has taken its toll.   No HTN per pt.   PAIN:  Are you having pain? Yes: NPRS scale: 4/10 Pain location: R low back > L Pain description: achy, sore Aggravating factors: lifting her 2.77 month old grand daughter (20 lbs), sleeping (any position), standing and doing dishes  Relieving factors: Heat, repositioning  PRECAUTIONS: None  RED FLAGS: Bowel or bladder incontinence: No and Cauda equina syndrome: No   WEIGHT BEARING RESTRICTIONS: No  FALLS:  Has patient fallen in last 6 months? No  LIVING ENVIRONMENT: Lives with: lives with their spouse and lives with their son Lives in: House/apartment Stairs: Yes: External: 7 steps; can reach both  Has following equipment at home: Ramped entry  OCCUPATION: substitute teacher  PLOF: Independent  PATIENT GOALS: Strengthen her back, figure out what is best for her arthritis  NEXT MD VISIT: November 2024  OBJECTIVE:  Note: Objective measures were completed at Evaluation unless otherwise noted.  DIAGNOSTIC FINDINGS:   MR LUMBAR SPINE WO CONTRAST 03/11/2023   CLINICAL DATA:  Chronic, progressively worsening low back, hip, and leg pain, worse on the right. No prior surgery.   EXAM: MRI LUMBAR SPINE WITHOUT CONTRAST   TECHNIQUE: Multiplanar, multisequence MR imaging of the lumbar spine was performed. No intravenous contrast was administered.   COMPARISON:  CT abdomen pelvis dated  Dec 23, 2022.   FINDINGS: Segmentation: Hypoplastic ribs at T12. Partial sacralization of L5.   Alignment:  Physiologic.   Vertebrae: No fracture, evidence of discitis, or bone lesion. Mild right facet marrow edema at L4-L5.  Conus medullaris and cauda equina: Conus extends to the T12-L1 level. Conus and cauda equina appear normal.   Paraspinal and other soft tissues: Negative.   Disc levels:   T11-T12 to L1-L2: Negative.   L2-L3:  Disc desiccation and minimal disc bulging.  No stenosis.   L3-L4:  Disc desiccation and minimal disc bulging.  No stenosis.   L4-L5: Mild disc bulging. Moderate right and mild left facet arthropathy. No stenosis.   L5-S1:  Transitional level.  Negative.   IMPRESSION: 1. Transitional anatomy. Correlation with radiographs is recommended prior to any operative intervention. 2. Mild degenerative disc disease from L2-L3 through L4-L5. No stenosis or impingement. 3. Moderate right facet arthropathy at L4-L5 with mild degenerative marrow edema, which can be a source of pain.     Electronically Signed   By: Obie Dredge M.D.   On: 03/21/2023 08:41     DG Lumbar Spine Complete W/Bend 11/06/2022  CLINICAL DATA:  Low back pain for 3 months.   EXAM: LUMBAR SPINE - COMPLETE WITH BENDING VIEWS   COMPARISON:  January 17, 2020   FINDINGS: There is no evidence of lumbar spine fracture. Minimal curvature of spine. Mild decreased intervertebral space is identified in the lower lumbar spine with facet joint sclerosis.   IMPRESSION: Mild degenerative joint changes of lower lumbar spine.     Electronically Signed   By: Sherian Rein M.D.   On: 11/06/2022 10:11    PATIENT SURVEYS:  FOTO Lumbar Spine FOTO 57 (05/15/2023)  SCREENING FOR RED FLAGS: Bowel or bladder incontinence: No Cauda equina syndrome: No    COGNITION: Overall cognitive status: Within functional limits for tasks assessed     SENSATION:   MUSCLE LENGTH:   POSTURE:   forward neck, B protracted shoulders, R lateral shift, movement preference around L2/3 area. R greater trochanter and knee higher   Decreased back pain with R lateral shift correction  PALPATION: R ASIS more anterior and inferior   LUMBAR ROM:   AROM eval  Flexion Full, with pain  Extension WFL with R low back pain  Right lateral flexion WFL with R low back pain  Left lateral flexion WFL with slight R low back pain and stretch  Right rotation full  Left rotation full with R low back pain   (Blank rows = not tested)  LOWER EXTREMITY ROM:     Passive  Right eval Left eval  Hip flexion    Hip extension    Hip abduction    Hip adduction    Hip internal rotation (at 90/90) 26 20  Hip external rotation    Knee flexion    Knee extension    Ankle dorsiflexion    Ankle plantarflexion    Ankle inversion    Ankle eversion     (Blank rows = not tested)  LOWER EXTREMITY MMT:    MMT Right eval Left eval  Hip flexion    Hip extension 4- with R low back pain 4  Hip abduction 4 with R low back pain 4  Hip adduction    Hip internal rotation    Hip external rotation    Knee flexion    Knee extension    Ankle dorsiflexion    Ankle plantarflexion    Ankle inversion    Ankle eversion     (Blank rows = not tested)  LUMBAR SPECIAL TESTS:  (+) Eli test R and L (+) Long sit test suggesting anterior nutation of R innominate.   FUNCTIONAL  TESTS:    GAIT:     TODAY'S TREATMENT:                                                                                                                              DATE: 05/19/2023    Therapeutic exercise Standing squats (for ergonomic lifting)  10x  Picking up a pillow with 20 lbs total ankle weights around it from the floor to simulate picking up her grand daughter ergonomically at home  Then walking about  10 ft (proper hold of baby, not at side) 5x   Cues for transversus abdominis muscle activation.   Seated transversus  abdominis contraction 10x5 seconds   Hooklying  Crunch 10x2   March 10x3 each LE  Standing with contralateral UE assist   Single leg dead lift   R 10x3  Standing B shoulder low rows red band 10x5 seconds      Improved exercise technique, movement at target joints, use of target muscles after mod verbal, visual, tactile cues.       PATIENT EDUCATION:  Education details: there-ex, HEP, POC Person educated: Patient Education method: Explanation, Demonstration, Tactile cues, Verbal cues, and Handouts Education comprehension: verbalized understanding and returned demonstration  HOME EXERCISE PROGRAM: Access Code: [email or web address] URL: [email or web address] Date: 05/15/2023 Prepared by: Loralyn Freshwater  Exercises - Hooklying Single Knee to Chest Stretch  - 3 x daily - 7 x weekly - 1 sets - 5 reps - 30 seconds hold - Seated Transversus Abdominis Bracing  - 5 x daily - 7 x weekly - 3 sets - 10 reps - 5 seconds hold  - Seated Transversus Abdominis Bracing  - 5 x daily - 7 x weekly - 3 sets - 10 reps - 5 seconds hold - Curl Up with Arms Crossed  - 1 x daily - 7 x weekly - 2 sets - 10 reps     ASSESSMENT:  CLINICAL IMPRESSION: Worked on ergonomic lifting, transversus abdominis activation, glute strengthening to decrease stress to her low back, especially when taking care of her 20 lb grand daughter. No back pain reported after session. Pt will benefit from continued skilled physical therapy services to decrease pain, improve strength and function.         OBJECTIVE IMPAIRMENTS: decreased activity tolerance, decreased strength, improper body mechanics, postural dysfunction, and pain.   ACTIVITY LIMITATIONS: carrying, lifting, bending, standing, sleeping, and caring for others  PARTICIPATION LIMITATIONS:   PERSONAL FACTORS: Fitness, Past/current experiences, Time since onset of injury/illness/exacerbation, and 3+ comorbidities: anxiety, chronic pain syndrome,  depression, fibromyalgia  are also affecting patient's functional outcome.   REHAB POTENTIAL: Fair    CLINICAL DECISION MAKING: Stable/uncomplicated  EVALUATION COMPLEXITY: Low   GOALS: Goals reviewed with patient? Yes  SHORT TERM GOALS: Target date: 06/06/2023  Pt will be independent with her initial HEP to decrease pain, improve strength and function.  Baseline: Pt has started her initial HEP (05/15/2023) Goal status: INITIAL   LONG  TERM GOALS: Target date: 07/11/2023  Pt will have a decrease in low back pain to 3/10 or less at worst to promote ability to perform tasks which involve bending over, lifting, and standing more comfortably for her back.  Baseline: 8/10 at worst for the past 3 months (05/15/2023) Goal status: INITIAL  2.  Pt will improve her B hip extension and abduction strength by at least 1/2 MMT grade to promote ability to perform standing tasks more comfortably for her back.  Baseline:  MMT Right eval Left eval  Hip extension 4- with R low back pain 4  Hip abduction 4 with R low back pain 4  (05/15/2023)   Goal status: INITIAL  3.  Pt will improve her lumbar spine FOTO score by at least 10 points as a demonstration of improved function.  Baseline: Lumbar Spine FOTO 57 (05/15/2023) Goal status: INITIAL   PLAN:  PT FREQUENCY: 2x/week  PT DURATION: 8 weeks  PLANNED INTERVENTIONS: Therapeutic exercises, Therapeutic activity, Neuromuscular re-education, Balance training, Gait training, Patient/Family education, Joint mobilization, Joint manipulation, Aquatic Therapy, Dry Needling, Electrical stimulation, Spinal manipulation, Spinal mobilization, Ionotophoresis 4mg /ml Dexamethasone, Manual therapy, and Re-evaluation.  PLAN FOR NEXT SESSION: Posture, trunk and glute med, max strength, manual techniques, modalities PRN     Karrington Mccravy, PT, DPT 05/19/2023, 11:28 AM

## 2023-05-20 ENCOUNTER — Ambulatory Visit

## 2023-05-21 ENCOUNTER — Encounter

## 2023-05-21 ENCOUNTER — Ambulatory Visit

## 2023-05-25 LAB — COLOGUARD: COLOGUARD: NEGATIVE

## 2023-05-25 LAB — EXTERNAL GENERIC LAB PROCEDURE: COLOGUARD: NEGATIVE

## 2023-05-26 ENCOUNTER — Encounter

## 2023-05-27 ENCOUNTER — Encounter

## 2023-05-28 ENCOUNTER — Encounter

## 2023-06-02 ENCOUNTER — Encounter

## 2023-06-03 ENCOUNTER — Ambulatory Visit

## 2023-06-03 ENCOUNTER — Telehealth: Payer: Self-pay

## 2023-06-03 NOTE — Telephone Encounter (Signed)
No show. Called patient and left a message pertaining to appointment and a reminder for the next follow up session. Return phone call requested. Phone number ([phone]) provided.   

## 2023-06-04 ENCOUNTER — Encounter

## 2023-06-06 ENCOUNTER — Ambulatory Visit

## 2023-06-06 ENCOUNTER — Telehealth: Payer: Self-pay

## 2023-06-06 NOTE — Telephone Encounter (Signed)
No show. Called patient and left a message pertaining to appointment and a reminder for the next follow up session. Return phone call requested. Phone number ([phone]) provided.   

## 2023-06-09 ENCOUNTER — Encounter

## 2023-06-10 ENCOUNTER — Ambulatory Visit

## 2023-06-10 DIAGNOSIS — M5459 Other low back pain: Secondary | ICD-10-CM | POA: Diagnosis not present

## 2023-06-10 DIAGNOSIS — M6281 Muscle weakness (generalized): Secondary | ICD-10-CM

## 2023-06-10 DIAGNOSIS — G8929 Other chronic pain: Secondary | ICD-10-CM

## 2023-06-10 NOTE — Therapy (Signed)
OUTPATIENT PHYSICAL THERAPY Treatment    Patient Name: Grace Shepard MRN: [id number] DOB:[birth year], 57 y.o., female Today's Date: 06/10/2023  END OF SESSION:  PT End of Session - 06/10/23 1525     Visit Number 3    Number of Visits 17    Date for PT Re-Evaluation 07/11/23    PT Start Time 1525    PT Stop Time 1612    PT Time Calculation (min) 47 min    Activity Tolerance Patient tolerated treatment well    Behavior During Therapy [facility] for tasks assessed/performed               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   Hyperlipidemia    Hypothyroidism    Ischemic colitis (HCC)    Migraine    Thyroid disease    UTI (lower urinary tract infection)    Past Surgical History:  Procedure Laterality Date   CHOLECYSTECTOMY N/A 02/08/2022   Procedure: LAPAROSCOPIC CHOLECYSTECTOMY WITH INTRAOPERATIVE CHOLANGIOGRAM;  Surgeon: Earline Mayotte, MD;  Location: [facility] ORS;  Service: General;  Laterality: N/A;  Sonda Rumble, RNFA to assist   TUBAL LIGATION     Patient Active Problem List   Diagnosis Date Noted   DDD (degenerative disc disease), lumbosacral 11/21/2022   Lumbar facet joint pain 11/21/2022   Facet joint sclerosis (Lumbar) 11/21/2022   Osteoarthritis of facet joint of lumbar spine 11/21/2022   Chronic low back pain (Right) w/o sciatica 11/06/2022   Low back pain of over 3 months duration 11/06/2022   Abnormal NCS (nerve conduction studies) (02/12/2016) 11/06/2022   Spondylosis without myelopathy or radiculopathy, lumbosacral region 11/06/2022   Lichen sclerosus 09/24/2021   Class 1 obesity due to excess calories with body mass index (BMI) of 30.0 to 30.9 in adult 02/19/2021   Eczema 12/11/2020   Postmenopausal atrophic vaginitis  12/11/2020   Other spondylosis, sacral and sacrococcygeal region 02/08/2020   Enthesopathy of hip region (Left) 02/08/2020   Greater trochanteric bursitis of hips (Bilateral) 02/08/2020   Chronic sacroiliac joint pain (Right) 02/07/2020   Sacroiliac joint dysfunction (Bilateral) (R>L) 02/07/2020   Trochanteric bursitis of hip (Bilateral) 02/07/2020   Chronic hip pain (Left) 02/07/2020   Trochanteric bursitis, right hip 02/07/2020   Chronic pain syndrome 01/17/2020   Chronic lower extremity pain (2ry area of Pain) (Bilateral) (R>L) 01/17/2020   Chronic hip pain (Bilateral) (R>L) 01/17/2020   Chronic upper extremity pain (Bilateral) 01/17/2020   Cervicalgia (Right) 01/17/2020   Chronic occipital headache (Right) 01/17/2020   Cervicogenic headache (Right) 01/17/2020   Occipital neuralgia (lesser occipital nerve) (Right) 01/17/2020   Cervical facet syndrome (Right) 01/17/2020   Cervico-occipital neuralgia (Right) 01/17/2020   Chronic sacroiliac joint pain (Bilateral) (R>L) 01/17/2020   Lumbar facet syndrome (Bilateral) (R>L) 01/17/2020   HLD (hyperlipidemia) 03/04/2019   Anxiety and depression 05/20/2016   GERD (gastroesophageal reflux disease) 06/15/2015   Chronic low back pain (1ry area of Pain) (Bilateral) (R>L) w/o sciatica 06/02/2015   Insomnia 05/22/2015   Fibromyalgia 04/21/2015   Migraines 04/21/2015   Acquired hypothyroidism 12/19/2014    PCP: Enid Baas, Judie Petit   REFERRING PROVIDER: Morene Crocker, MD   REFERRING DIAG: M54.50 (ICD-10-CM) -  Low back pain  Rationale for Evaluation and Treatment: Rehabilitation  THERAPY DIAG:  Muscle weakness (generalized)  Other low back pain  Chronic midline low back pain without sciatica  ONSET DATE: 2 weeks ago  SUBJECTIVE:                                                                                                                                                                                           SUBJECTIVE  STATEMENT: Low back has been hurting. 2/10 sitting, 3/10 walking currently. 8/10 at most for the past 7 days.       PERTINENT HISTORY:  Low back  pain.  R low back pain, has continuous hurt in r low back . Has been keeping her 73.35 month old grand daughter and has been flaring. The flare up started within the last 2 weeks. No LE radiating symptoms.  Pain has been the same since 2 weeks ago when the flare-up occurred. Had to keep her grand-daughter 3-4 days a week and has taken its toll.   No HTN per pt.   PAIN:  Are you having pain? Yes: NPRS scale: 4/10 Pain location: R low back > L Pain description: achy, sore Aggravating factors: lifting her 26.11 month old grand daughter (20 lbs), sleeping (any position), standing and doing dishes  Relieving factors: Heat, repositioning  PRECAUTIONS: None  RED FLAGS: Bowel or bladder incontinence: No and Cauda equina syndrome: No   WEIGHT BEARING RESTRICTIONS: No  FALLS:  Has patient fallen in last 6 months? No  LIVING ENVIRONMENT: Lives with: lives with their spouse and lives with their son Lives in: House/apartment Stairs: Yes: External: 7 steps; can reach both  Has following equipment at home: Ramped entry  OCCUPATION: substitute teacher  PLOF: Independent  PATIENT GOALS: Strengthen her back, figure out what is best for her arthritis  NEXT MD VISIT: November 2024  OBJECTIVE:  Note: Objective measures were completed at Evaluation unless otherwise noted.  DIAGNOSTIC FINDINGS:   MR LUMBAR SPINE WO CONTRAST 03/11/2023   CLINICAL DATA:  Chronic, progressively worsening low back, hip, and leg pain, worse on the right. No prior surgery.   EXAM: MRI LUMBAR SPINE WITHOUT CONTRAST   TECHNIQUE: Multiplanar, multisequence MR imaging of the lumbar spine was performed. No intravenous contrast was administered.   COMPARISON:  CT abdomen pelvis dated Dec 23, 2022.   FINDINGS: Segmentation: Hypoplastic ribs at T12. Partial  sacralization of L5.   Alignment:  Physiologic.   Vertebrae: No fracture, evidence of discitis, or bone lesion. Mild right facet marrow edema at L4-L5.   Conus medullaris and cauda equina: Conus extends to the T12-L1  level. Conus and cauda equina appear normal.   Paraspinal and other soft tissues: Negative.   Disc levels:   T11-T12 to L1-L2: Negative.   L2-L3:  Disc desiccation and minimal disc bulging.  No stenosis.   L3-L4:  Disc desiccation and minimal disc bulging.  No stenosis.   L4-L5: Mild disc bulging. Moderate right and mild left facet arthropathy. No stenosis.   L5-S1:  Transitional level.  Negative.   IMPRESSION: 1. Transitional anatomy. Correlation with radiographs is recommended prior to any operative intervention. 2. Mild degenerative disc disease from L2-L3 through L4-L5. No stenosis or impingement. 3. Moderate right facet arthropathy at L4-L5 with mild degenerative marrow edema, which can be a source of pain.     Electronically Signed   By: Obie Dredge M.D.   On: 03/21/2023 08:41     DG Lumbar Spine Complete W/Bend 11/06/2022  CLINICAL DATA:  Low back pain for 3 months.   EXAM: LUMBAR SPINE - COMPLETE WITH BENDING VIEWS   COMPARISON:  January 17, 2020   FINDINGS: There is no evidence of lumbar spine fracture. Minimal curvature of spine. Mild decreased intervertebral space is identified in the lower lumbar spine with facet joint sclerosis.   IMPRESSION: Mild degenerative joint changes of lower lumbar spine.     Electronically Signed   By: Sherian Rein M.D.   On: 11/06/2022 10:11    PATIENT SURVEYS:  FOTO Lumbar Spine FOTO 57 (05/15/2023)  SCREENING FOR RED FLAGS: Bowel or bladder incontinence: No Cauda equina syndrome: No    COGNITION: Overall cognitive status: Within functional limits for tasks assessed     SENSATION:   MUSCLE LENGTH:   POSTURE:  forward neck, B protracted shoulders, R lateral shift, movement preference  around L2/3 area. R greater trochanter and knee higher   Decreased back pain with R lateral shift correction  PALPATION: R ASIS more anterior and inferior   LUMBAR ROM:   AROM eval  Flexion Full, with pain  Extension WFL with R low back pain  Right lateral flexion WFL with R low back pain  Left lateral flexion WFL with slight R low back pain and stretch  Right rotation full  Left rotation full with R low back pain   (Blank rows = not tested)  LOWER EXTREMITY ROM:     Passive  Right eval Left eval  Hip flexion    Hip extension    Hip abduction    Hip adduction    Hip internal rotation (at 90/90) 26 20  Hip external rotation    Knee flexion    Knee extension    Ankle dorsiflexion    Ankle plantarflexion    Ankle inversion    Ankle eversion     (Blank rows = not tested)  LOWER EXTREMITY MMT:    MMT Right eval Left eval  Hip flexion    Hip extension 4- with R low back pain 4  Hip abduction 4 with R low back pain 4  Hip adduction    Hip internal rotation    Hip external rotation    Knee flexion    Knee extension    Ankle dorsiflexion    Ankle plantarflexion    Ankle inversion    Ankle eversion     (Blank rows = not tested)  LUMBAR SPECIAL TESTS:  (+) Eli test R and L (+) Long sit test suggesting anterior nutation of R innominate.   FUNCTIONAL TESTS:    GAIT:     TODAY'S  TREATMENT:                                                                                                                              DATE: 06/10/2023    Therapeutic exercise  Standing R lateral shift correction 10x2 with 5 second holds   Supine hip flexor stretch for R LE 1 min  R low back discomfort.   Sitting with R foot on foot stool. Decreased low back pain  Then with trunk flexion 10x5 seconds for 2 sets  Decreased low back pain to 1/10 afterwards  Hooklying  posterior pelvic tilt 10x10 seconds   B shoulder horizontal abduction to promote thoracic extension and  decrease extension stress to low back 10x5 seconds for 2 sets  posterior pelvic tilt 10x10 seconds   Then with hip extension isometrics, leg straight   R 8x5 seconds    L 8x5 seconds   Good R and L hip flexor muscle stretch reported during exercise  posterior pelvic tilt 2x10 seconds. L low back discomfort which decreases with L SKTC  Supine L piriformis stretch 30 seconds    Improved exercise technique, movement at target joints, use of target muscles after mod verbal, visual, tactile cues.       PATIENT EDUCATION:  Education details: there-ex, HEP, POC Person educated: Patient Education method: Explanation, Demonstration, Tactile cues, Verbal cues, and Handouts Education comprehension: verbalized understanding and returned demonstration  HOME EXERCISE PROGRAM: Access Code: [email or web address] URL: [email or web address] Date: 05/15/2023 Prepared by: Loralyn Freshwater  Exercises - Hooklying Single Knee to Chest Stretch  - 3 x daily - 7 x weekly - 1 sets - 5 reps - 30 seconds hold - Seated Transversus Abdominis Bracing  - 5 x daily - 7 x weekly - 3 sets - 10 reps - 5 seconds hold  - Seated Transversus Abdominis Bracing  - 5 x daily - 7 x weekly - 3 sets - 10 reps - 5 seconds hold - Curl Up with Arms Crossed  - 1 x daily - 7 x weekly - 2 sets - 10 reps - Seated Flexion Stretch  - 1 x daily - 7 x weekly - 3 sets - 10 reps - 5 seconds hold    ASSESSMENT:  CLINICAL IMPRESSION:  Decreased low back pain with treatment to improve core strength, thoracic extension to help decrease extension stress to lumbar spine. Not really having any back pain in standing reported after session. Demonstrates B hip flexor tightness as well which may be contributing to her lumbar extension stress. Worked on improving hip extension ROM and glute max strength as tolerated to help address. Pt will benefit from continued skilled physical therapy services to decrease pain, improve strength and function.          OBJECTIVE IMPAIRMENTS: decreased activity tolerance, decreased strength, improper body mechanics, postural dysfunction, and pain.   ACTIVITY LIMITATIONS: carrying, lifting, bending, standing, sleeping, and caring for others  PARTICIPATION LIMITATIONS:  PERSONAL FACTORS: Fitness, Past/current experiences, Time since onset of injury/illness/exacerbation, and 3+ comorbidities: anxiety, chronic pain syndrome, depression, fibromyalgia  are also affecting patient's functional outcome.   REHAB POTENTIAL: Fair    CLINICAL DECISION MAKING: Stable/uncomplicated  EVALUATION COMPLEXITY: Low   GOALS: Goals reviewed with patient? Yes  SHORT TERM GOALS: Target date: 06/06/2023  Pt will be independent with her initial HEP to decrease pain, improve strength and function.  Baseline: Pt has started her initial HEP (05/15/2023) Goal status: INITIAL   LONG TERM GOALS: Target date: 07/11/2023  Pt will have a decrease in low back pain to 3/10 or less at worst to promote ability to perform tasks which involve bending over, lifting, and standing more comfortably for her back.  Baseline: 8/10 at worst for the past 3 months (05/15/2023) Goal status: INITIAL  2.  Pt will improve her B hip extension and abduction strength by at least 1/2 MMT grade to promote ability to perform standing tasks more comfortably for her back.  Baseline:  MMT Right eval Left eval  Hip extension 4- with R low back pain 4  Hip abduction 4 with R low back pain 4  (05/15/2023)   Goal status: INITIAL  3.  Pt will improve her lumbar spine FOTO score by at least 10 points as a demonstration of improved function.  Baseline: Lumbar Spine FOTO 57 (05/15/2023) Goal status: INITIAL   PLAN:  PT FREQUENCY: 2x/week  PT DURATION: 8 weeks  PLANNED INTERVENTIONS: Therapeutic exercises, Therapeutic activity, Neuromuscular re-education, Balance training, Gait training, Patient/Family education, Joint mobilization, Joint  manipulation, Aquatic Therapy, Dry Needling, Electrical stimulation, Spinal manipulation, Spinal mobilization, Ionotophoresis 4mg /ml Dexamethasone, Manual therapy, and Re-evaluation.  PLAN FOR NEXT SESSION: Posture, trunk and glute med, max strength, manual techniques, modalities PRN     Ozro Russett, PT, DPT 06/10/2023, 4:15 PM

## 2023-06-11 ENCOUNTER — Encounter

## 2023-06-16 ENCOUNTER — Encounter

## 2023-06-17 ENCOUNTER — Ambulatory Visit

## 2023-06-18 ENCOUNTER — Encounter

## 2023-06-19 ENCOUNTER — Ambulatory Visit: Attending: Neurology

## 2023-06-19 ENCOUNTER — Encounter

## 2023-06-19 DIAGNOSIS — M545 Low back pain, unspecified: Secondary | ICD-10-CM | POA: Diagnosis present

## 2023-06-19 DIAGNOSIS — G8929 Other chronic pain: Secondary | ICD-10-CM | POA: Diagnosis present

## 2023-06-19 DIAGNOSIS — M5459 Other low back pain: Secondary | ICD-10-CM | POA: Insufficient documentation

## 2023-06-19 DIAGNOSIS — M6281 Muscle weakness (generalized): Secondary | ICD-10-CM | POA: Diagnosis present

## 2023-06-19 NOTE — Therapy (Signed)
OUTPATIENT PHYSICAL THERAPY TREATMENT    Patient Name: Grace Shepard MRN: [id number] DOB:1966/03/09, 57 y.o., female Today's Date: 06/19/2023  END OF SESSION:  PT End of Session - 06/19/23 0902     Visit Number 4    Number of Visits 17    Date for PT Re-Evaluation 07/11/23    PT Start Time 0903    PT Stop Time 0945    PT Time Calculation (min) 42 min    Activity Tolerance Patient tolerated treatment well    Behavior During Therapy [facility] for tasks assessed/performed                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   Hyperlipidemia    Hypothyroidism    Ischemic colitis (HCC)    Migraine    Thyroid disease    UTI (lower urinary tract infection)    Past Surgical History:  Procedure Laterality Date   CHOLECYSTECTOMY N/A 02/08/2022   Procedure: LAPAROSCOPIC CHOLECYSTECTOMY WITH INTRAOPERATIVE CHOLANGIOGRAM;  Surgeon: Earline Mayotte, MD;  Location: [facility] ORS;  Service: General;  Laterality: N/A;  Sonda Rumble, RNFA to assist   TUBAL LIGATION     Patient Active Problem List   Diagnosis Date Noted   DDD (degenerative disc disease), lumbosacral 11/21/2022   Lumbar facet joint pain 11/21/2022   Facet joint sclerosis (Lumbar) 11/21/2022   Osteoarthritis of facet joint of lumbar spine 11/21/2022   Chronic low back pain (Right) w/o sciatica 11/06/2022   Low back pain of over 3 months duration 11/06/2022   Abnormal NCS (nerve conduction studies) (02/12/2016) 11/06/2022   Spondylosis without myelopathy or radiculopathy, lumbosacral region 11/06/2022   Lichen sclerosus 09/24/2021   Class 1 obesity due to excess calories with body mass index (BMI) of 30.0 to 30.9 in adult 02/19/2021   Eczema 12/11/2020   Postmenopausal atrophic vaginitis  12/11/2020   Other spondylosis, sacral and sacrococcygeal region 02/08/2020   Enthesopathy of hip region (Left) 02/08/2020   Greater trochanteric bursitis of hips (Bilateral) 02/08/2020   Chronic sacroiliac joint pain (Right) 02/07/2020   Sacroiliac joint dysfunction (Bilateral) (R>L) 02/07/2020   Trochanteric bursitis of hip (Bilateral) 02/07/2020   Chronic hip pain (Left) 02/07/2020   Trochanteric bursitis, right hip 02/07/2020   Chronic pain syndrome 01/17/2020   Chronic lower extremity pain (2ry area of Pain) (Bilateral) (R>L) 01/17/2020   Chronic hip pain (Bilateral) (R>L) 01/17/2020   Chronic upper extremity pain (Bilateral) 01/17/2020   Cervicalgia (Right) 01/17/2020   Chronic occipital headache (Right) 01/17/2020   Cervicogenic headache (Right) 01/17/2020   Occipital neuralgia (lesser occipital nerve) (Right) 01/17/2020   Cervical facet syndrome (Right) 01/17/2020   Cervico-occipital neuralgia (Right) 01/17/2020   Chronic sacroiliac joint pain (Bilateral) (R>L) 01/17/2020   Lumbar facet syndrome (Bilateral) (R>L) 01/17/2020   HLD (hyperlipidemia) 03/04/2019   Anxiety and depression 05/20/2016   GERD (gastroesophageal reflux disease) 06/15/2015   Chronic low back pain (1ry area of Pain) (Bilateral) (R>L) w/o sciatica 06/02/2015   Insomnia 05/22/2015   Fibromyalgia 04/21/2015   Migraines 04/21/2015   Acquired hypothyroidism 12/19/2014    PCP: Enid Baas, Judie Petit   REFERRING PROVIDER: Morene Crocker, MD   REFERRING DIAG: M54.50 (ICD-10-CM) -  Low back pain  Rationale for Evaluation and Treatment: Rehabilitation  THERAPY DIAG:  Muscle weakness (generalized)  Other low back pain  Chronic midline low back pain without sciatica  ONSET DATE: 2 weeks ago  SUBJECTIVE:                                                                                                                                                                                           SUBJECTIVE  STATEMENT:  Pt reports that the back is bothering her in a different spot today.  Pt notes that she was baking yesterday and standing for 4 hours in one spot and that may have cause her acute influx in pain.  Pt reporting 5/10 pain level upon arrival.    PERTINENT HISTORY:  Low back  pain.  R low back pain, has continuous hurt in r low back . Has been keeping her 47.29 month old grand daughter and has been flaring. The flare up started within the last 2 weeks. No LE radiating symptoms.  Pain has been the same since 2 weeks ago when the flare-up occurred. Had to keep her grand-daughter 3-4 days a week and has taken its toll.   No HTN per pt.   PAIN:  Are you having pain? Yes: NPRS scale: 4/10 Pain location: R low back > L Pain description: achy, sore Aggravating factors: lifting her 36.66 month old grand daughter (20 lbs), sleeping (any position), standing and doing dishes  Relieving factors: Heat, repositioning  PRECAUTIONS: None  RED FLAGS: Bowel or bladder incontinence: No and Cauda equina syndrome: No   WEIGHT BEARING RESTRICTIONS: No  FALLS:  Has patient fallen in last 6 months? No  LIVING ENVIRONMENT: Lives with: lives with their spouse and lives with their son Lives in: House/apartment Stairs: Yes: External: 7 steps; can reach both  Has following equipment at home: Ramped entry  OCCUPATION: substitute teacher  PLOF: Independent  PATIENT GOALS: Strengthen her back, figure out what is best for her arthritis  NEXT MD VISIT: November 2024  OBJECTIVE:  Note: Objective measures were completed at Evaluation unless otherwise noted.  DIAGNOSTIC FINDINGS:   MR LUMBAR SPINE WO CONTRAST 03/11/2023   CLINICAL DATA:  Chronic, progressively worsening low back, hip, and leg pain, worse on the right. No prior surgery.   EXAM: MRI LUMBAR SPINE WITHOUT CONTRAST   TECHNIQUE: Multiplanar, multisequence MR imaging of the lumbar spine was performed. No intravenous contrast was  administered.   COMPARISON:  CT abdomen pelvis dated Dec 23, 2022.   FINDINGS: Segmentation: Hypoplastic ribs at T12. Partial sacralization of L5.   Alignment:  Physiologic.   Vertebrae:  No fracture, evidence of discitis, or bone lesion. Mild right facet marrow edema at L4-L5.   Conus medullaris and cauda equina: Conus extends to the T12-L1 level. Conus and cauda equina appear normal.   Paraspinal and other soft tissues: Negative.   Disc levels:   T11-T12 to L1-L2: Negative.   L2-L3:  Disc desiccation and minimal disc bulging.  No stenosis.   L3-L4:  Disc desiccation and minimal disc bulging.  No stenosis.   L4-L5: Mild disc bulging. Moderate right and mild left facet arthropathy. No stenosis.   L5-S1:  Transitional level.  Negative.   IMPRESSION: 1. Transitional anatomy. Correlation with radiographs is recommended prior to any operative intervention. 2. Mild degenerative disc disease from L2-L3 through L4-L5. No stenosis or impingement. 3. Moderate right facet arthropathy at L4-L5 with mild degenerative marrow edema, which can be a source of pain.     Electronically Signed   By: Obie Dredge M.D.   On: 03/21/2023 08:41     DG Lumbar Spine Complete W/Bend 11/06/2022  CLINICAL DATA:  Low back pain for 3 months.   EXAM: LUMBAR SPINE - COMPLETE WITH BENDING VIEWS   COMPARISON:  January 17, 2020   FINDINGS: There is no evidence of lumbar spine fracture. Minimal curvature of spine. Mild decreased intervertebral space is identified in the lower lumbar spine with facet joint sclerosis.   IMPRESSION: Mild degenerative joint changes of lower lumbar spine.     Electronically Signed   By: Sherian Rein M.D.   On: 11/06/2022 10:11    PATIENT SURVEYS:  FOTO Lumbar Spine FOTO 57 (05/15/2023)  SCREENING FOR RED FLAGS: Bowel or bladder incontinence: No Cauda equina syndrome: No    COGNITION: Overall cognitive status: Within functional limits for tasks  assessed     SENSATION:   MUSCLE LENGTH:   POSTURE:  forward neck, B protracted shoulders, R lateral shift, movement preference around L2/3 area. R greater trochanter and knee higher   Decreased back pain with R lateral shift correction  PALPATION: R ASIS more anterior and inferior   LUMBAR ROM:   AROM eval  Flexion Full, with pain  Extension WFL with R low back pain  Right lateral flexion WFL with R low back pain  Left lateral flexion WFL with slight R low back pain and stretch  Right rotation full  Left rotation full with R low back pain   (Blank rows = not tested)  LOWER EXTREMITY ROM:     Passive  Right eval Left eval  Hip flexion    Hip extension    Hip abduction    Hip adduction    Hip internal rotation (at 90/90) 26 20  Hip external rotation    Knee flexion    Knee extension    Ankle dorsiflexion    Ankle plantarflexion    Ankle inversion    Ankle eversion     (Blank rows = not tested)  LOWER EXTREMITY MMT:    MMT Right eval Left eval  Hip flexion    Hip extension 4- with R low back pain 4  Hip abduction 4 with R low back pain 4  Hip adduction    Hip internal rotation    Hip external rotation    Knee flexion    Knee extension    Ankle dorsiflexion    Ankle plantarflexion    Ankle inversion    Ankle eversion     (Blank rows = not tested)  LUMBAR SPECIAL TESTS:  (+) Eli  test R and L (+) Long sit test suggesting anterior nutation of R innominate.     TODAY'S TREATMENT: DATE: 06/19/23    TherEx:  Supine lower trunk rotations, 3 sec holds, 2x10 Supine B hamstring stretch with therapist applied overpressure, SLR, 30 sec bouts, x 2 each LE Supine B figure 4 stretch with therapist applied overpressure, 30 sec bouts, x 2 each LE Supine B piriformis stretch with therapist applied overpressure, 30 sec bouts, x 2 each LE Supine SKTC with therapist applied overpressure, 30 sec bouts, x 2 each LE   Manual:   Supine low load long  distraction of B hips, 30 sec bouts on R LE with moderate pain relief  Hooklying SI Joint release with shotgun method (isometric abduction with quick adduction following 3 sets of isometric abduction), x2 attempts with minimal relief  Hooklying lateral R hip glide/mobilization with belt, Grades II-III, for improved joint mobility and pain modulation, 30 sec x multiple bouts  Prone AP hip glide/mobilization, Grades II-III, for improved joint mobility and pain modulation, 30 sec x multiple bouts  Prone STM with TP release technique applied to piriformis, use of IR/ER of bent knee for increased tissue accessibility, ~10 minutes spent performing with good pain modulation  Prone PA's for improved joint mobilization of the lumbar spine, Grades II-III, 30 sec bouts at each segment    Improved exercise technique, movement at target joints, use of target muscles after mod verbal, visual, tactile cues.       PATIENT EDUCATION:  Education details: there-ex, HEP, POC Person educated: Patient Education method: Explanation, Demonstration, Tactile cues, Verbal cues, and Handouts Education comprehension: verbalized understanding and returned demonstration  HOME EXERCISE PROGRAM: Access Code: [email or web address] URL: [email or web address] Date: 05/15/2023 Prepared by: Loralyn Freshwater  Exercises - Hooklying Single Knee to Chest Stretch  - 3 x daily - 7 x weekly - 1 sets - 5 reps - 30 seconds hold - Seated Transversus Abdominis Bracing  - 5 x daily - 7 x weekly - 3 sets - 10 reps - 5 seconds hold  - Seated Transversus Abdominis Bracing  - 5 x daily - 7 x weekly - 3 sets - 10 reps - 5 seconds hold - Curl Up with Arms Crossed  - 1 x daily - 7 x weekly - 2 sets - 10 reps - Seated Flexion Stretch  - 1 x daily - 7 x weekly - 3 sets - 10 reps - 5 seconds hold    ASSESSMENT:  CLINICAL IMPRESSION:  Pt responded well to the manual therapy approach, noting a significant reduction in overall pain  levels, stating 1/10 upon leaving  (5/10 upon arrival).  Pt noted to have significant TP's in the R piriformis with therapist discussing potential candidate for dry needling.  Therapist provided education to pt about the process and potential for the benefits at a future session with pt being agreeable.  Pt also self-reported to be walking better after leaving the clinic.   Pt will continue to benefit from skilled therapy to address remaining deficits in order to improve overall QoL and return to PLOF.            OBJECTIVE IMPAIRMENTS: decreased activity tolerance, decreased strength, improper body mechanics, postural dysfunction, and pain.   ACTIVITY LIMITATIONS: carrying, lifting, bending, standing, sleeping, and caring for others  PARTICIPATION LIMITATIONS:   PERSONAL FACTORS: Fitness, Past/current experiences, Time since onset of injury/illness/exacerbation, and 3+ comorbidities: anxiety, chronic pain syndrome, depression, fibromyalgia  are also affecting patient's functional outcome.  REHAB POTENTIAL: Fair    CLINICAL DECISION MAKING: Stable/uncomplicated  EVALUATION COMPLEXITY: Low   GOALS: Goals reviewed with patient? Yes  SHORT TERM GOALS: Target date: 06/06/2023  Pt will be independent with her initial HEP to decrease pain, improve strength and function.  Baseline: Pt has started her initial HEP (05/15/2023) Goal status: INITIAL   LONG TERM GOALS: Target date: 07/11/2023  Pt will have a decrease in low back pain to 3/10 or less at worst to promote ability to perform tasks which involve bending over, lifting, and standing more comfortably for her back.  Baseline: 8/10 at worst for the past 3 months (05/15/2023) Goal status: INITIAL  2.  Pt will improve her B hip extension and abduction strength by at least 1/2 MMT grade to promote ability to perform standing tasks more comfortably for her back.  Baseline:  MMT Right eval Left eval  Hip extension 4- with R low back  pain 4  Hip abduction 4 with R low back pain 4  (05/15/2023)   Goal status: INITIAL  3.  Pt will improve her lumbar spine FOTO score by at least 10 points as a demonstration of improved function.  Baseline: Lumbar Spine FOTO 57 (05/15/2023) Goal status: INITIAL   PLAN:  PT FREQUENCY: 2x/week  PT DURATION: 8 weeks  PLANNED INTERVENTIONS: Therapeutic exercises, Therapeutic activity, Neuromuscular re-education, Balance training, Gait training, Patient/Family education, Joint mobilization, Joint manipulation, Aquatic Therapy, Dry Needling, Electrical stimulation, Spinal manipulation, Spinal mobilization, Ionotophoresis 4mg /ml Dexamethasone, Manual therapy, and Re-evaluation.  PLAN FOR NEXT SESSION: Assess potential for dry needling, compliance with exercises, introduce increased glute strengthening exercises. Posture, trunk and glute med, max strength, manual techniques, modalities PRN     Nolon Bussing, PT, DPT Physical Therapist - [place]  [facility]  06/19/23, 10:27 AM

## 2023-06-24 ENCOUNTER — Ambulatory Visit

## 2023-06-24 DIAGNOSIS — G8929 Other chronic pain: Secondary | ICD-10-CM

## 2023-06-24 DIAGNOSIS — M6281 Muscle weakness (generalized): Secondary | ICD-10-CM

## 2023-06-24 DIAGNOSIS — M545 Low back pain, unspecified: Secondary | ICD-10-CM

## 2023-06-24 DIAGNOSIS — M5459 Other low back pain: Secondary | ICD-10-CM

## 2023-06-24 NOTE — Therapy (Signed)
OUTPATIENT PHYSICAL THERAPY TREATMENT    Patient Name: Grace Shepard MRN: [id number] DOB:[birth year], 57 y.o., female Today's Date: 06/24/2023  END OF SESSION:  PT End of Session - 06/24/23 1310     Visit Number 5    Number of Visits 17    Date for PT Re-Evaluation 07/11/23    Authorization Type [employer]    Progress Note Due on Visit 10    PT Start Time 1307    PT Stop Time 1345    PT Time Calculation (min) 38 min    Activity Tolerance Patient tolerated treatment well;No increased pain    Behavior During Therapy WFL for tasks assessed/performed                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   Hyperlipidemia    Hypothyroidism    Ischemic colitis (HCC)    Migraine    Thyroid disease    UTI (lower urinary tract infection)    Past Surgical History:  Procedure Laterality Date   CHOLECYSTECTOMY N/A 02/08/2022   Procedure: LAPAROSCOPIC CHOLECYSTECTOMY WITH INTRAOPERATIVE CHOLANGIOGRAM;  Surgeon: Earline Mayotte, MD;  Location: [facility] ORS;  Service: General;  Laterality: N/A;  Sonda Rumble, RNFA to assist   TUBAL LIGATION     Patient Active Problem List   Diagnosis Date Noted   DDD (degenerative disc disease), lumbosacral 11/21/2022   Lumbar facet joint pain 11/21/2022   Facet joint sclerosis (Lumbar) 11/21/2022   Osteoarthritis of facet joint of lumbar spine 11/21/2022   Chronic low back pain (Right) w/o sciatica 11/06/2022   Low back pain of over 3 months duration 11/06/2022   Abnormal NCS (nerve conduction studies) (02/12/2016) 11/06/2022   Spondylosis without myelopathy or radiculopathy, lumbosacral region 11/06/2022   Lichen sclerosus 09/24/2021   Class 1 obesity due to excess calories with body mass index (BMI) of 30.0 to  30.9 in adult 02/19/2021   Eczema 12/11/2020   Postmenopausal atrophic vaginitis 12/11/2020   Other spondylosis, sacral and sacrococcygeal region 02/08/2020   Enthesopathy of hip region (Left) 02/08/2020   Greater trochanteric bursitis of hips (Bilateral) 02/08/2020   Chronic sacroiliac joint pain (Right) 02/07/2020   Sacroiliac joint dysfunction (Bilateral) (R>L) 02/07/2020   Trochanteric bursitis of hip (Bilateral) 02/07/2020   Chronic hip pain (Left) 02/07/2020   Trochanteric bursitis, right hip 02/07/2020   Chronic pain syndrome 01/17/2020   Chronic lower extremity pain (2ry area of Pain) (Bilateral) (R>L) 01/17/2020   Chronic hip pain (Bilateral) (R>L) 01/17/2020   Chronic upper extremity pain (Bilateral) 01/17/2020   Cervicalgia (Right) 01/17/2020   Chronic occipital headache (Right) 01/17/2020   Cervicogenic headache (Right) 01/17/2020   Occipital neuralgia (lesser occipital nerve) (Right) 01/17/2020   Cervical facet syndrome (Right) 01/17/2020   Cervico-occipital neuralgia (Right) 01/17/2020   Chronic sacroiliac joint pain (Bilateral) (R>L) 01/17/2020   Lumbar facet syndrome (Bilateral) (R>L) 01/17/2020   HLD (hyperlipidemia) 03/04/2019   Anxiety and depression 05/20/2016   GERD (gastroesophageal reflux disease) 06/15/2015   Chronic low back pain (1ry area of Pain) (Bilateral) (R>L) w/o sciatica 06/02/2015   Insomnia 05/22/2015   Fibromyalgia 04/21/2015   Migraines 04/21/2015   Acquired hypothyroidism 12/19/2014    PCP:  Enid Baas, M  REFERRING PROVIDER: Morene Crocker, MD  REFERRING DIAG: M54.50 (ICD-10-CM) - Low back pain  Rationale for Evaluation and Treatment: Rehabilitation  THERAPY DIAG:  Muscle weakness (generalized)  Other low back pain  Chronic midline low back pain without sciatica  ONSET DATE: 2 weeks ago  SUBJECTIVE:                                                                                                                                                                                            SUBJECTIVE STATEMENT:  Pt reports fairly good relieve from last session for several days prior to return to increase. HEP performance has been sporadic.    PERTINENT HISTORY:  Low back  pain.  R low back pain, has continuous hurt in r low back . Has been keeping her 43.37 month old grand daughter and has been flaring. The flare up started within the last 2 weeks. No LE radiating symptoms.  Pain has been the same since 2 weeks ago when the flare-up occurred. Had to keep her grand-daughter 3-4 days a week and has taken its toll.    PAIN:  Are you having pain? 3/10 Rt PSIS;   PRECAUTIONS: None  RED FLAGS: Bowel or bladder incontinence: No and Cauda equina syndrome: No   WEIGHT BEARING RESTRICTIONS: No  FALLS:  Has patient fallen in last 6 months? No  PATIENT GOALS: Strengthen her back, figure out what is best for her arthritis  NEXT MD VISIT: November 2024  OBJECTIVE:  Note: Objective measures were completed at Evaluation unless otherwise noted.  PATIENT SURVEYS:  FOTO Lumbar Spine FOTO 57 (05/15/2023) ; 06/24/23: 51 (does not correlate with feelings of improvement)   TODAY'S TREATMENT: DATE: 06/24/23  -FABER stretch in hooklying 2x60sec bilat  -SKTC 2x30sec bilat -FOTO survery  -discussion about TPDN  -GreenTB clams in hooklying 1x15 -blue yoga ball squeeze 1x15  -GreenTB clams in hooklying 1x15 -blue yoga ball squeeze 1x15   -MFR Rt posterior iliac gluteals/ERs: glute max, piriformis, lateral gluteus medius: *all quite tender. Comparison ot left side is drastically different.    PATIENT EDUCATION:  Education details: there-ex, HEP, POC Person educated: Patient Education method: Explanation, Demonstration, Tactile cues, Verbal cues, and Handouts Education comprehension: verbalized understanding and returned demonstration  HOME EXERCISE PROGRAM: Access Code: [email or web address] URL:  [email or web address] Date: 05/15/2023 Prepared by: Loralyn Freshwater  Exercises - Hooklying Single Knee to Chest Stretch  - 3 x daily - 7 x weekly - 1 sets - 5 reps - 30 seconds hold - Seated Transversus Abdominis Bracing  - 5 x daily - 7 x weekly - 3  sets - 10 reps - 5 seconds hold - Curl Up with Arms Crossed  - 1 x daily - 7 x weekly - 2 sets - 10 reps - Seated Flexion Stretch  - 1 x daily - 7 x weekly - 3 sets - 10 reps - 5 seconds hold    ASSESSMENT:  CLINICAL IMPRESSION:  Pain improved but remains in newer lower area near PSIS Rt. Continued with pelvis mobility, core activation, manual release. Good tolerance within session, however palpation to deep hip musculature is significantly tender. Pt will consider addition of needling in future as appropriate to achieve deeper tissue intervention, however, may be more intense than what would be tolerated at present. Will continue to advance as able.      OBJECTIVE IMPAIRMENTS: decreased activity tolerance, decreased strength, improper body mechanics, postural dysfunction, and pain.   ACTIVITY LIMITATIONS: carrying, lifting, bending, standing, sleeping, and caring for others  PARTICIPATION LIMITATIONS:   PERSONAL FACTORS: Fitness, Past/current experiences, Time since onset of injury/illness/exacerbation, and 3+ comorbidities: anxiety, chronic pain syndrome, depression, fibromyalgia  are also affecting patient's functional outcome.   REHAB POTENTIAL: Fair    CLINICAL DECISION MAKING: Stable/uncomplicated  EVALUATION COMPLEXITY: Low   GOALS: Goals reviewed with patient? Yes  SHORT TERM GOALS: Target date: 06/06/2023  Pt will be independent with her initial HEP to decrease pain, improve strength and function.  Baseline: Pt has started her initial HEP (05/15/2023) Goal status: INITIAL   LONG TERM GOALS: Target date: 07/11/2023  Pt will have a decrease in low back pain to 3/10 or less at worst to promote ability to  perform tasks which involve bending over, lifting, and standing more comfortably for her back.  Baseline: 8/10 at worst for the past 3 months (05/15/2023) Goal status: INITIAL  2.  Pt will improve her B hip extension and abduction strength by at least 1/2 MMT grade to promote ability to perform standing tasks more comfortably for her back.  Baseline:  MMT Right eval Left eval  Hip extension 4- with R low back pain 4  Hip abduction 4 with R low back pain 4  (05/15/2023)   Goal status: INITIAL  3.  Pt will improve her lumbar spine FOTO score by at least 10 points as a demonstration of improved function.  Baseline: Lumbar Spine FOTO 57 (05/15/2023) Goal status: INITIAL   PLAN:  PT FREQUENCY: 2x/week  PT DURATION: 8 weeks  PLANNED INTERVENTIONS: Therapeutic exercises, Therapeutic activity, Neuromuscular re-education, Balance training, Gait training, Patient/Family education, Joint mobilization, Joint manipulation, Aquatic Therapy, Dry Needling, Electrical stimulation, Spinal manipulation, Spinal mobilization, Ionotophoresis 4mg /ml Dexamethasone, Manual therapy, and Re-evaluation.  PLAN FOR NEXT SESSION: Assess potential for dry needling, compliance with exercises, introduce increased glute strengthening exercises. Posture, trunk and glute med, max strength, manual techniques, modalities PRN     1:50 PM, 06/24/23 Rosamaria Lints, PT, DPT Physical Therapist - [place] [phone] (Office)

## 2023-06-26 ENCOUNTER — Encounter

## 2023-07-01 ENCOUNTER — Ambulatory Visit

## 2023-07-01 DIAGNOSIS — M6281 Muscle weakness (generalized): Secondary | ICD-10-CM | POA: Diagnosis not present

## 2023-07-01 DIAGNOSIS — M545 Low back pain, unspecified: Secondary | ICD-10-CM

## 2023-07-01 DIAGNOSIS — M5459 Other low back pain: Secondary | ICD-10-CM

## 2023-07-01 NOTE — Therapy (Addendum)
OUTPATIENT PHYSICAL THERAPY TREATMENT    Patient Name: Grace Shepard MRN: [id number] DOB:1966-04-28, 57 y.o., female Today's Date: 07/01/2023  END OF SESSION:  PT End of Session - 07/01/23 1041     Visit Number 6    Number of Visits 17    Date for PT Re-Evaluation 07/11/23    Authorization Type [employer]    Progress Note Due on Visit 10    PT Start Time 1041    PT Stop Time 1115    PT Time Calculation (min) 34 min    Activity Tolerance Patient tolerated treatment well;No increased pain    Behavior During Therapy WFL for tasks assessed/performed                 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   Hyperlipidemia    Hypothyroidism    Ischemic colitis (HCC)    Migraine    Thyroid disease    UTI (lower urinary tract infection)    Past Surgical History:  Procedure Laterality Date   CHOLECYSTECTOMY N/A 02/08/2022   Procedure: LAPAROSCOPIC CHOLECYSTECTOMY WITH INTRAOPERATIVE CHOLANGIOGRAM;  Surgeon: Earline Mayotte, MD;  Location: [facility] ORS;  Service: General;  Laterality: N/A;  Sonda Rumble, RNFA to assist   TUBAL LIGATION     Patient Active Problem List   Diagnosis Date Noted   DDD (degenerative disc disease), lumbosacral 11/21/2022   Lumbar facet joint pain 11/21/2022   Facet joint sclerosis (Lumbar) 11/21/2022   Osteoarthritis of facet joint of lumbar spine 11/21/2022   Chronic low back pain (Right) w/o sciatica 11/06/2022   Low back pain of over 3 months duration 11/06/2022   Abnormal NCS (nerve conduction studies) (02/12/2016) 11/06/2022   Spondylosis without myelopathy or radiculopathy, lumbosacral region 11/06/2022   Lichen sclerosus 09/24/2021   Class 1 obesity due to excess calories with body mass index (BMI) of 30.0 to  30.9 in adult 02/19/2021   Eczema 12/11/2020   Postmenopausal atrophic vaginitis 12/11/2020   Other spondylosis, sacral and sacrococcygeal region 02/08/2020   Enthesopathy of hip region (Left) 02/08/2020   Greater trochanteric bursitis of hips (Bilateral) 02/08/2020   Chronic sacroiliac joint pain (Right) 02/07/2020   Sacroiliac joint dysfunction (Bilateral) (R>L) 02/07/2020   Trochanteric bursitis of hip (Bilateral) 02/07/2020   Chronic hip pain (Left) 02/07/2020   Trochanteric bursitis, right hip 02/07/2020   Chronic pain syndrome 01/17/2020   Chronic lower extremity pain (2ry area of Pain) (Bilateral) (R>L) 01/17/2020   Chronic hip pain (Bilateral) (R>L) 01/17/2020   Chronic upper extremity pain (Bilateral) 01/17/2020   Cervicalgia (Right) 01/17/2020   Chronic occipital headache (Right) 01/17/2020   Cervicogenic headache (Right) 01/17/2020   Occipital neuralgia (lesser occipital nerve) (Right) 01/17/2020   Cervical facet syndrome (Right) 01/17/2020   Cervico-occipital neuralgia (Right) 01/17/2020   Chronic sacroiliac joint pain (Bilateral) (R>L) 01/17/2020   Lumbar facet syndrome (Bilateral) (R>L) 01/17/2020   HLD (hyperlipidemia) 03/04/2019   Anxiety and depression 05/20/2016   GERD (gastroesophageal reflux disease) 06/15/2015   Chronic low back pain (1ry area of Pain) (Bilateral) (R>L) w/o sciatica 06/02/2015   Insomnia 05/22/2015   Fibromyalgia 04/21/2015   Migraines 04/21/2015   Acquired hypothyroidism 12/19/2014  PCP: Enid Baas, M  REFERRING PROVIDER: Morene Crocker, MD  REFERRING DIAG: M54.50 (ICD-10-CM) - Low back pain  Rationale for Evaluation and Treatment: Rehabilitation  THERAPY DIAG:  Muscle weakness (generalized)  Other low back pain  Chronic midline low back pain without sciatica  ONSET DATE: 2 weeks ago  SUBJECTIVE:                                                                                                                                                                                            SUBJECTIVE STATEMENT:   Pt arrives to the clinic late, she accidentally started going towards her aunt and uncles due to being on the phone with her uncle, not thinking about coming to therapy.  Pt denies any pain upon arrival.     PERTINENT HISTORY:  Low back  pain.  R low back pain, has continuous hurt in r low back . Has been keeping her 6.32 month old grand daughter and has been flaring. The flare up started within the last 2 weeks. No LE radiating symptoms.  Pain has been the same since 2 weeks ago when the flare-up occurred. Had to keep her grand-daughter 3-4 days a week and has taken its toll.    PAIN:  Are you having pain? 3/10 Rt PSIS;   PRECAUTIONS: None  RED FLAGS: Bowel or bladder incontinence: No and Cauda equina syndrome: No   WEIGHT BEARING RESTRICTIONS: No  FALLS:  Has patient fallen in last 6 months? No  PATIENT GOALS: Strengthen her back, figure out what is best for her arthritis  NEXT MD VISIT: November 2024  OBJECTIVE:  Note: Objective measures were completed at Evaluation unless otherwise noted.  PATIENT SURVEYS:  FOTO Lumbar Spine FOTO 57 (05/15/2023) ; 06/24/23: 51 (does not correlate with feelings of improvement)   TODAY'S TREATMENT: DATE: 07/01/23   Manual:    Supine low load long distraction of B hips, 30 sec bouts on R LE with moderate pain relief  Hooklying SI Joint release with shotgun method (isometric abduction with quick adduction following 3 sets of isometric abduction), x2 attempts with minimal relief  Prone AP hip glide/mobilization, Grades II-III, for improved joint mobility and pain modulation, 30 sec x multiple bouts   Prone STM with TP release technique applied to piriformis, use of IR/ER of bent knee for increased tissue accessibility, ~10 minutes spent performing with good pain modulation   Prone PA's for improved joint mobilization of the lumbar spine, Grades  II-III, 30 sec bouts at each segment     PATIENT EDUCATION:  Education details: there-ex, HEP, POC Person educated: Patient Education method:  Explanation, Demonstration, Tactile cues, Verbal cues, and Handouts Education comprehension: verbalized understanding and returned demonstration  HOME EXERCISE PROGRAM: Access Code: [email or web address] URL: [email or web address] Date: 05/15/2023 Prepared by: Loralyn Freshwater  Exercises - Hooklying Single Knee to Chest Stretch  - 3 x daily - 7 x weekly - 1 sets - 5 reps - 30 seconds hold - Seated Transversus Abdominis Bracing  - 5 x daily - 7 x weekly - 3 sets - 10 reps - 5 seconds hold - Curl Up with Arms Crossed  - 1 x daily - 7 x weekly - 2 sets - 10 reps - Seated Flexion Stretch  - 1 x daily - 7 x weekly - 3 sets - 10 reps - 5 seconds hold    ASSESSMENT:  CLINICAL IMPRESSION:  Pt arrived late to the clinic, so session limited due to time constraints.  Pt responded well to the manual therapy, noting a complete reduction in pain at the conclusion of the session.  Pt noted to have a cavitation in the pelvic joint with mobilization following the shotgun manipulation method.  Pt noted relief with the mobilization as well.   Pt will continue to benefit from skilled therapy to address remaining deficits in order to improve overall QoL and return to PLOF.        OBJECTIVE IMPAIRMENTS: decreased activity tolerance, decreased strength, improper body mechanics, postural dysfunction, and pain.   ACTIVITY LIMITATIONS: carrying, lifting, bending, standing, sleeping, and caring for others  PARTICIPATION LIMITATIONS:   PERSONAL FACTORS: Fitness, Past/current experiences, Time since onset of injury/illness/exacerbation, and 3+ comorbidities: anxiety, chronic pain syndrome, depression, fibromyalgia  are also affecting patient's functional outcome.   REHAB POTENTIAL: Fair    CLINICAL DECISION MAKING: Stable/uncomplicated  EVALUATION COMPLEXITY:  Low   GOALS: Goals reviewed with patient? Yes  SHORT TERM GOALS: Target date: 06/06/2023  Pt will be independent with her initial HEP to decrease pain, improve strength and function.  Baseline: Pt has started her initial HEP (05/15/2023) Goal status: INITIAL   LONG TERM GOALS: Target date: 07/11/2023  Pt will have a decrease in low back pain to 3/10 or less at worst to promote ability to perform tasks which involve bending over, lifting, and standing more comfortably for her back.  Baseline: 8/10 at worst for the past 3 months (05/15/2023) Goal status: INITIAL  2.  Pt will improve her B hip extension and abduction strength by at least 1/2 MMT grade to promote ability to perform standing tasks more comfortably for her back.  Baseline:  MMT Right eval Left eval  Hip extension 4- with R low back pain 4  Hip abduction 4 with R low back pain 4  (05/15/2023)   Goal status: INITIAL  3.  Pt will improve her lumbar spine FOTO score by at least 10 points as a demonstration of improved function.  Baseline: Lumbar Spine FOTO 57 (05/15/2023) Goal status: INITIAL   PLAN:  PT FREQUENCY: 2x/week  PT DURATION: 8 weeks  PLANNED INTERVENTIONS: Therapeutic exercises, Therapeutic activity, Neuromuscular re-education, Balance training, Gait training, Patient/Family education, Joint mobilization, Joint manipulation, Aquatic Therapy, Dry Needling, Electrical stimulation, Spinal manipulation, Spinal mobilization, Ionotophoresis 4mg /ml Dexamethasone, Manual therapy, and Re-evaluation.  PLAN FOR NEXT SESSION:  Assess potential for dry needling, compliance with exercises, introduce increased glute strengthening exercises. Posture, trunk and glute med, max strength, manual techniques, modalities PRN    Nolon Bussing, PT, DPT Physical Therapist - [facility]  07/01/23, 12:56 PM

## 2023-07-03 ENCOUNTER — Ambulatory Visit

## 2023-07-14 ENCOUNTER — Encounter

## 2023-07-18 ENCOUNTER — Ambulatory Visit

## 2023-07-21 ENCOUNTER — Ambulatory Visit

## 2023-07-23 ENCOUNTER — Encounter

## 2023-08-05 ENCOUNTER — Other Ambulatory Visit: Payer: Self-pay | Admitting: Dermatology

## 2023-08-05 DIAGNOSIS — L309 Dermatitis, unspecified: Secondary | ICD-10-CM

## 2023-09-30 ENCOUNTER — Ambulatory Visit (INDEPENDENT_AMBULATORY_CARE_PROVIDER_SITE_OTHER): Admitting: Dermatology

## 2023-09-30 VITALS — BP 98/67

## 2023-09-30 DIAGNOSIS — L649 Androgenic alopecia, unspecified: Secondary | ICD-10-CM | POA: Diagnosis not present

## 2023-09-30 DIAGNOSIS — Z79899 Other long term (current) drug therapy: Secondary | ICD-10-CM

## 2023-09-30 MED ORDER — MINOXIDIL 2.5 MG PO TABS: 2.5000 mg | ORAL_TABLET | Freq: Every day | ORAL | 1 refills | Status: DC

## 2023-09-30 MED ORDER — FINASTERIDE 5 MG PO TABS: 5.0000 mg | ORAL_TABLET | Freq: Every day | ORAL | 1 refills | Status: DC

## 2023-09-30 NOTE — Patient Instructions (Addendum)
 Treatment Plan: Continue minoxidil 2.5 mg every day Continue finasteride 5 mg every day  Doses of oral minoxidil for hair loss are considered 'low dose'. This is because the doses used for hair loss are much lower than the doses which are used for conditions such as high blood pressure (hypertension). The doses used for hypertension are 10-40mg  per day.  Side effects are uncommon at the low doses (up to 2.5 mg/day) used to treat hair loss. Potential side effects, more commonly seen at higher doses, include: Increase in hair growth (hypertrichosis) elsewhere on face and body Temporary hair shedding upon starting medication which may last up to 4 weeks Ankle swelling, fluid retention, rapid weight gain more than 5 pounds Low blood pressure and feeling lightheaded or dizzy when standing up quickly Fast or irregular heartbeat Headaches  Discussed side effects including but not limited to sexual dysfunction, erectile dysfunction, decreased libido, depression, suicidality. Sexual side effects can be permanent.  Due to recent changes in healthcare laws, you may see results of your pathology and/or laboratory studies on MyChart before the doctors have had a chance to review them. We understand that in some cases there may be results that are confusing or concerning to you. Please understand that not all results are received at the same time and often the doctors may need to interpret multiple results in order to provide you with the best plan of care or course of treatment. Therefore, we ask that you please give [place] 2 business days to thoroughly review all your results before contacting the office for clarification. Should we see a critical lab result, you will be contacted sooner.   If You Need Anything After Your Visit  If you have any questions or concerns for your doctor, please call our main line at [phone] and press option 4 to reach your doctor's medical assistant. If no one answers, please  leave a voicemail as directed and we will return your call as soon as possible. Messages left after 4 pm will be answered the following business day.   You may also send [place] a message via MyChart. We typically respond to MyChart messages within 1-2 business days.  For prescription refills, please ask your pharmacy to contact our office. Our fax number is [phone].  If you have an urgent issue when the clinic is closed that cannot wait until the next business day, you can page your doctor at the number below.    Please note that while we do our best to be available for urgent issues outside of office hours, we are not available 24/7.   If you have an urgent issue and are unable to reach [place], you may choose to seek medical care at your doctor's office, retail clinic, urgent care center, or emergency room.  If you have a medical emergency, please immediately call 911 or go to the emergency department.  Pager Numbers  - Dr. Gwen Pounds: [phone]  - Dr. Roseanne Reno: [phone]  - Dr. Katrinka Blazing: [phone]   In the event of inclement weather, please call our main line at [phone] for an update on the status of any delays or closures.  Dermatology Medication Tips: Please keep the boxes that topical medications come in in order to help keep track of the instructions about where and how to use these. Pharmacies typically print the medication instructions only on the boxes and not directly on the medication tubes.   If your medication is too expensive, please contact our office at [phone] option  4 or send [place] a message through MyChart.   We are unable to tell what your co-pay for medications will be in advance as this is different depending on your insurance coverage. However, we may be able to find a substitute medication at lower cost or fill out paperwork to get insurance to cover a needed medication.   If a prior authorization is required to get your medication covered by your  insurance company, please allow [place] 1-2 business days to complete this process.  Drug prices often vary depending on where the prescription is filled and some pharmacies may offer cheaper prices.  The website www.goodrx.com contains coupons for medications through different pharmacies. The prices here do not account for what the cost may be with help from insurance (it may be cheaper with your insurance), but the website can give you the price if you did not use any insurance.  - You can print the associated coupon and take it with your prescription to the pharmacy.  - You may also stop by our office during regular business hours and pick up a GoodRx coupon card.  - If you need your prescription sent electronically to a different pharmacy, notify our office through [facility] or by phone at [phone] option 4.     Si Usted Necesita Algo Despus de Su Visita  Tambin puede enviarnos un mensaje a travs de Clinical cytogeneticist. Por lo general respondemos a los mensajes de MyChart en el transcurso de 1 a 2 das hbiles.  Para renovar recetas, por favor pida a su farmacia que se ponga en contacto con nuestra oficina. Annie Sable de fax es [place] [phone].  Si tiene un asunto urgente cuando la clnica est cerrada y que no puede esperar hasta el siguiente da hbil, puede llamar/localizar a su doctor(a) al nmero que aparece a continuacin.   Por favor, tenga en cuenta que aunque hacemos todo lo posible para estar disponibles para asuntos urgentes fuera del horario de [place], no estamos disponibles las 24 horas del da, los 7 [address]  [address] de la [place].   Si tiene un problema urgente y no puede comunicarse con nosotros, puede optar por buscar atencin mdica  en el consultorio de su doctor(a), en una clnica privada, en un centro de atencin urgente o en una sala de emergencias.  Si tiene Engineer, drilling, por favor llame inmediatamente al 911 o vaya a la sala de emergencias.  Nmeros de  bper  - Dr. Gwen Pounds: [phone]  - Dra. Roseanne Reno: [phone]  - Dr. Katrinka Blazing: [phone]   En caso de inclemencias del tiempo, por favor llame a Lacy Duverney principal al [phone] para una actualizacin sobre el [place] de cualquier retraso o cierre.  Consejos para la medicacin en dermatologa: Por favor, guarde las cajas en las que vienen los medicamentos de uso tpico para ayudarle a seguir las instrucciones sobre dnde y cmo usarlos. Las farmacias generalmente imprimen las instrucciones del medicamento slo en las cajas y no directamente en los tubos del [place].   Si su medicamento es muy caro, por favor, pngase en contacto con Rolm Gala llamando al [phone] y presione la opcin 4 o envenos un mensaje a travs de Clinical cytogeneticist.   No podemos decirle cul ser su copago por los medicamentos por adelantado ya que esto es diferente dependiendo de la cobertura de su seguro. Sin embargo, es posible que podamos encontrar un medicamento sustituto a Audiological scientist un formulario para que el seguro cubra el medicamento que se considera necesario.  Si se requiere una autorizacin previa para que su compaa de seguros [place] su medicamento, por favor permtanos de 1 a 2 das hbiles para completar [address]  Los precios de los medicamentos varan con frecuencia dependiendo del Environmental consultant de dnde se surte la receta y alguna farmacias pueden ofrecer precios ms baratos.  El sitio web www.goodrx.com tiene cupones para medicamentos de Health and safety inspector. Los precios aqu no tienen en cuenta lo que podra costar con la ayuda del seguro (puede ser ms barato con su seguro), pero el sitio web puede darle el precio si no utiliz Tourist information centre manager.  - Puede imprimir el cupn correspondiente y llevarlo con su receta a la farmacia.  - Tambin puede pasar por nuestra oficina durante el horario de atencin regular y Education officer, museum una tarjeta de cupones de GoodRx.  - Si necesita que su receta se  enve electrnicamente a una farmacia diferente, informe a nuestra oficina a travs de MyChart de [place] o por telfono llamando al [phone] y presione la opcin 4.

## 2023-09-30 NOTE — Progress Notes (Signed)
 Follow-Up Visit   Subjective  Grace Shepard is a 58 y.o. female who presents for the following: Androgenetic alopecia. Patient taking minoxidil 2.5 mg and finasteride 5 mg daily. Patient without side effects and seeing improvement in hair growth.    The following portions of the chart were reviewed this encounter and updated as appropriate: medications, allergies, medical history  Review of Systems:  No other skin or systemic complaints except as noted in HPI or Assessment and Plan.  Objective  Well appearing patient in no apparent distress; mood and affect are within normal limits.   A focused examination was performed of the following areas: scalp  Relevant exam findings are noted in the Assessment and Plan.    Assessment & Plan   ANDROGENETIC ALOPECIA (FEMALE PATTERN HAIR LOSS) Exam: Diffuse thinning of the crown and widening of the midline part with retention of the frontal hairline.  Increased hair growth noted when compared to baseline photos BP 98/67  Chronic and persistent condition with duration or expected duration over one year. Condition is improving with treatment but not currently at goal.    Female Androgenic Alopecia is a chronic condition related to genetics and/or hormonal changes.  In women androgenetic alopecia is commonly associated with menopause but may occur any time after puberty.  It causes hair thinning primarily on the crown with widening of the part and temporal hairline recession.  Can use OTC Rogaine (minoxidil) 5% solution/foam as directed.  Oral treatments in female patients who have no contraindication may include : - Low dose oral minoxidil 1.25 - 5mg  daily - Spironolactone 50 - 100mg  bid - Finasteride 2.5 - 5 mg daily Adjunctive therapies include: - Low Level Laser Light Therapy (LLLT) - Platelet-rich plasma injections (PRP) - Hair Transplants or scalp reduction   Treatment Plan: Continue minoxidil 2.5 mg every day Continue  finasteride 5 mg every day  Doses of oral minoxidil for hair loss are considered 'low dose'. This is because the doses used for hair loss are much lower than the doses which are used for conditions such as high blood pressure (hypertension). The doses used for hypertension are 10-40mg  per day.  Side effects are uncommon at the low doses (up to 2.5 mg/day) used to treat hair loss. Potential side effects, more commonly seen at higher doses, include: Increase in hair growth (hypertrichosis) elsewhere on face and body Temporary hair shedding upon starting medication which may last up to 4 weeks Ankle swelling, fluid retention, rapid weight gain more than 5 pounds Low blood pressure and feeling lightheaded or dizzy when standing up quickly Fast or irregular heartbeat Headaches  Discussed side effects of finasteride including but not limited to sexual dysfunction, erectile dysfunction, decreased libido, depression, suicidality. Sexual side effects can be permanent.   Long term medication management.  Patient is using long term (months to years) prescription medication  to control their dermatologic condition.  These medications require periodic monitoring to evaluate for efficacy and side effects and may require periodic laboratory monitoring.   ANDROGENETIC ALOPECIA   Related Medications finasteride (PROSCAR) 5 MG tablet Take 1 tablet (5 mg total) by mouth daily. minoxidil (LONITEN) 2.5 MG tablet Take 1 tablet (2.5 mg total) by mouth daily.  Return in about 6 months (around 03/29/2024) for Alopecia.  [name], RMA, am acting as scribe for Willeen Niece, MD .   Documentation: I have reviewed the above documentation for accuracy and completeness, and I agree with the above.  Willeen Niece, MD   ]

## 2024-01-27 ENCOUNTER — Ambulatory Visit: Payer: Self-pay | Admitting: Emergency Medicine

## 2024-01-27 ENCOUNTER — Ambulatory Visit
Admission: EM | Admit: 2024-01-27 | Discharge: 2024-01-27 | Disposition: A | Discharge status: Home / Self Care | Attending: Emergency Medicine | Admitting: Emergency Medicine

## 2024-01-27 DIAGNOSIS — J069 Acute upper respiratory infection, unspecified: Secondary | ICD-10-CM | POA: Diagnosis present

## 2024-01-27 DIAGNOSIS — Z20822 Contact with and (suspected) exposure to covid-19: Secondary | ICD-10-CM | POA: Diagnosis present

## 2024-01-27 DIAGNOSIS — J0100 Acute maxillary sinusitis, unspecified: Secondary | ICD-10-CM | POA: Insufficient documentation

## 2024-01-27 LAB — SARS CORONAVIRUS 2 BY RT PCR: SARS Coronavirus 2 by RT PCR: NEGATIVE

## 2024-01-27 MED ORDER — DOXYCYCLINE HYCLATE 100 MG PO CAPS
100.0000 mg | ORAL_CAPSULE | Freq: Two times a day (BID) | ORAL | 0 refills | Status: AC
Start: 2024-01-27 — End: 2024-02-06

## 2024-01-27 MED ORDER — PREDNISONE 20 MG PO TABS
40.0000 mg | ORAL_TABLET | Freq: Every day | ORAL | 0 refills | Status: AC
Start: 2024-01-27 — End: 2024-02-01

## 2024-01-27 MED ORDER — ALBUTEROL SULFATE HFA 108 (90 BASE) MCG/ACT IN AERS: 1.0000 | INHALATION_SPRAY | RESPIRATORY_TRACT | 0 refills | Status: DC | PRN

## 2024-01-27 MED ORDER — PROMETHAZINE-DM 6.25-15 MG/5ML PO SYRP: 5.0000 mL | ORAL_SOLUTION | Freq: Four times a day (QID) | ORAL | 0 refills | Status: DC | PRN

## 2024-01-27 MED ORDER — FLUTICASONE PROPIONATE 50 MCG/ACT NA SUSP: 2.0000 | Freq: Every day | NASAL | 0 refills | Status: AC

## 2024-01-27 MED ORDER — AEROCHAMBER MV MISC: 1 refills | Status: AC

## 2024-01-27 NOTE — ED Triage Notes (Addendum)
 Patient states that congestion started sat evening. Green mucus. Taking mucinex and sudafed. Fatigue and body aches, headache, sore throat and cough  Patient states that she was bit by a tick 3 weeks ago. No rash or mark where she was bitten by tick.

## 2024-01-27 NOTE — ED Provider Notes (Signed)
 HPI  SUBJECTIVE:  Grace Shepard is a 58 y.o. female who presents with 4 days of nasal congestion, headaches, cough productive of green sputum, sore throat, greenish rhinorrhea, sinus pain and pressure, facial swelling, upper dental pain and postnasal drip.  Able to sleep at night because of the cough.  She has had bodyaches for 8 days.  No fevers, wheezing, chest pain, shortness of breath, dyspnea on exertion, nausea, vomiting, diarrhea, abdominal pain, rash.  No known COVID, flu, RSV or strep exposure.  She got 2 doses of COVID-vaccine and last year's flu vaccine.  No antibiotics in the past 3 months.  Antipyretic in the past 6 hours.  She has been taking Sudafed, Mucinex and Tylenol  with improvement in her symptoms.  Symptoms are worse when she has not been able to sleep.  She also states that she removed a firmly attached, nonengorged tick 2 to 3 weeks ago that had some surrounding erythema initially, which has resolved.  No surrounding rash or pain. She has a past medical history of fibromyalgia, GERD, hyperlipidemia, hypothyroidism, ischemic colitis, migraines, asthma, hypothyroidism, Lyme disease, depression/anxiety.  No history of diabetes, hypertension.  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   Hyperlipidemia    Hypothyroidism    Ischemic colitis (HCC)    Migraine    Thyroid  disease    UTI (lower urinary tract infection)     Past Surgical History:  Procedure Laterality Date   CHOLECYSTECTOMY N/A 02/08/2022   Procedure: LAPAROSCOPIC CHOLECYSTECTOMY WITH INTRAOPERATIVE CHOLANGIOGRAM;  Surgeon: Marshall Skeeter, MD;  Location: [facility] ORS;  Service: General;  Laterality: N/A;  Audie Leander, RNFA to assist   TUBAL LIGATION       Family History  Problem Relation Age of Onset   Drug abuse Sister    Alcohol abuse Sister    Depression Sister    Rheum arthritis Father    Congestive Heart Failure Father    Depression Father    Diabetes Father    Hearing loss Father    Osteoarthritis Mother    Stroke Mother    Aneurysm Mother    Breast cancer Maternal Aunt    Heart attack Maternal Grandfather    Diabetes Brother    Alcohol abuse Paternal Uncle    Arthritis Sister    Depression Sister    Heart disease Sister    Crohn's disease Sister     Social History   Tobacco Use   Smoking status: Never   Smokeless tobacco: Never  Vaping Use   Vaping status: Never Used  Substance Use Topics   Alcohol use: No   Drug use: No    No current facility-administered medications for this encounter.  Current Outpatient Medications:    albuterol  (VENTOLIN  HFA) 108 (90 Base) MCG/ACT inhaler, Inhale 1-2 puffs into the lungs every 4 (four) hours as needed for wheezing or shortness of breath., Disp: 1 each, Rfl: 0   doxycycline (VIBRAMYCIN) 100 MG capsule, Take 1 capsule (100 mg total) by mouth 2 (two) times daily for 10 days., Disp: 20 capsule, Rfl: 0   escitalopram (LEXAPRO) 10 MG tablet, TAKE 1 TABLET ONCE DAILY AND CONTINUE, Disp: , Rfl:  fluticasone (FLONASE) 50 MCG/ACT nasal spray, Place 2 sprays into both nostrils daily., Disp: 16 g, Rfl: 0   levothyroxine  (SYNTHROID ) 75 MCG tablet, Take 1 tablet (75 mcg total) by mouth daily., Disp: 90 tablet, Rfl: 0   montelukast  (SINGULAIR ) 10 MG tablet, TAKE 1 TABLET AT BEDTIME, Disp: 90 tablet, Rfl: 3   pantoprazole  (PROTONIX ) 40 MG tablet, Take 1 tablet (40 mg total) by mouth daily. (Patient taking differently: Take 40 mg by mouth 2 (two) times daily.), Disp: 90 tablet, Rfl: 2   predniSONE  (DELTASONE ) 20 MG tablet, Take 2 tablets (40 mg total) by mouth daily with breakfast for 5 days., Disp: 10 tablet, Rfl: 0   pregabalin  (LYRICA ) 75 MG capsule, Take 1 capsule (75 mg total) by  mouth 2 (two) times daily., Disp: 180 capsule, Rfl: 0   promethazine-dextromethorphan (PROMETHAZINE-DM) 6.25-15 MG/5ML syrup, Take 5 mLs by mouth 4 (four) times daily as needed for cough., Disp: 118 mL, Rfl: 0   rosuvastatin  (CRESTOR ) 10 MG tablet, , Disp: , Rfl:    Spacer/Aero-Holding Chambers (AEROCHAMBER MV) inhaler, Use as instructed, Disp: 1 each, Rfl: 1   topiramate  (TOPAMAX ) 100 MG tablet, Take 100 mg by mouth daily., Disp: , Rfl:    traZODone  (DESYREL ) 50 MG tablet, Take 1-2 tablets (50-100 mg total) by mouth at bedtime as needed for sleep. (Patient taking differently: Take 50 mg by mouth at bedtime as needed for sleep.), Disp: 180 tablet, Rfl: 0   vitamin B-12 (CYANOCOBALAMIN) 1000 MCG tablet, Take 50 mcg by mouth every other day. , Disp: , Rfl:    vitamin E 180 MG (400 UNITS) capsule, Take 400 Units by mouth daily., Disp: , Rfl:    acetaminophen  (TYLENOL ) 325 MG tablet, Take by mouth., Disp: , Rfl:    baclofen  (LIORESAL ) 10 MG tablet, Take 1 tablet (10 mg total) by mouth 3 (three) times daily as needed for muscle spasms., Disp: 90 each, Rfl: 0   [Paused] cetirizine (ZYRTEC) 10 MG tablet, Take 10 mg by mouth daily., Disp: , Rfl:    Cholecalciferol (VITAMIN D3) 125 MCG (5000 UT) CAPS, Take 4,000 Units by mouth., Disp: , Rfl:    clobetasol  cream (TEMOVATE ) 0.05 %, APPLY TO THE AFFECTED AREAS HANDS, FINGERS TWICE A DAY UNTIL IMPROVED. AVOID FACE, GROIN, UNDERARMS, Disp: 30 g, Rfl: 3   divalproex (DEPAKOTE) 250 MG DR tablet, Take by mouth., Disp: , Rfl:    estradiol  (ESTRACE ) 0.1 MG/GM vaginal cream, Place 1 Applicatorful vaginally 3 (three) times a week., Disp: 42.5 g, Rfl: 2   finasteride  (PROSCAR ) 5 MG tablet, Take 1 tablet (5 mg total) by mouth daily., Disp: 90 tablet, Rfl: 1   minoxidil  (LONITEN ) 2.5 MG tablet, Take 1 tablet (2.5 mg total) by mouth daily., Disp: 90 tablet, Rfl: 1   ondansetron  (ZOFRAN -ODT) 4 MG disintegrating tablet, Take 1 tablet (4 mg total) by mouth every 8 (eight)  hours as needed., Disp: 20 tablet, Rfl: 0   Tretinoin  (ALTRENO ) 0.05 % LOTN, Apply to face every night as tolerated., Disp: 45 g, Rfl: 5   triamcinolone  cream (KENALOG ) 0.1 %, APPLY 1 APPLICATION TOPICALLY TWICE A DAY AS NEEDED. AVOID FACE, GROIN, AXILLA, Disp: 60 g, Rfl: 11  Allergies  Allergen Reactions   Bactrim [Sulfamethoxazole-Trimethoprim] Hives   Gabapentin Other (See Comments)   Tramadol Itching     ROS  As noted in HPI.   Physical Exam  BP 111/81 (BP Location: Left Arm)   Pulse 89   Temp 98.4 F (36.9 C) (  Oral)   Resp 16   LMP 12/12/2014 (Approximate)   SpO2 100%   Constitutional: Well developed, well nourished, no acute distress Eyes: PERRL, EOMI, conjunctiva normal bilaterally HENT: Normocephalic, atraumatic,mucus membranes moist..  Positive nasal congestion.  Erythematous, swollen turbinates.  No frontal sinus tenderness.  Positive maxillary sinus tenderness.  Normal oropharynx.  Uvula midline.  Positive postnasal drip. Neck: No cervical lymphadenopathy.  No meningismus. Respiratory: Clear to auscultation bilaterally, no rales, no wheezing, no rhonchi.  No anterior, lateral chest wall tenderness Cardiovascular: Normal rate and rhythm, no murmurs, no gallops, no rubs GI: nondistended skin: No rash, skin intact Musculoskeletal: no deformities Neurologic: Alert & oriented x 3, CN III-XII grossly intact, no motor deficits, sensation grossly intact Psychiatric: Speech and behavior appropriate   ED Course   Medications - No data to display  Orders Placed This Encounter  Procedures   SARS Coronavirus 2 by RT PCR (hospital order, performed in [facility] hospital lab) *cepheid single result test* Anterior Nasal Swab    Standing Status:   Standing    Number of Occurrences:   1   Ehrlichia antibody panel    Standing Status:   Standing    Number of Occurrences:   1   Spotted Fever Group Antibodies    Standing Status:   Standing    Number of Occurrences:   1    Rocky mtn spotted fvr abs pnl(IgG+IgM)    Standing Status:   Standing    Number of Occurrences:   1   Airborne and Contact precautions    Standing Status:   Standing    Number of Occurrences:   1   Results for orders placed or performed during the hospital encounter of 01/27/24 (from the past 24 hours)  SARS Coronavirus 2 by RT PCR (hospital order, performed in [facility] hospital lab) *cepheid single result test* Anterior Nasal Swab     Status: None   Collection Time: 01/27/24 11:46 AM   Specimen: Anterior Nasal Swab  Result Value Ref Range   SARS Coronavirus 2 by RT PCR NEGATIVE NEGATIVE   No results found.  ED Clinical Impression  1. Acute non-recurrent maxillary sinusitis   2. Encounter for laboratory testing for COVID-19 virus   3. Upper respiratory tract infection, unspecified type      ED Assessment/Plan     Outside labs reviewed.  GFR from CMP done in February 2025 59 mL/min.  Presentation consistent with an acute maxillary sinusitis/upper respiratory infection.  Will check for COVID as she qualifies for antivirals if it is positive.    She qualifies for antibiotics due to severe symptoms of facial swelling.  Home with doxycycline for 10 days, Flonase, Mucinex D, Promethazine DM, saline nasal irrigation.  I am also concerned that her asthma is flaring.  Home with regularly scheduled albuterol  inhaler with a spacer for 4 days, then as needed thereafter, prednisone  40 mg for 5 days.  She declined a work note.  COVID-negative.  She would also like to be tested for [facility] spotted fever and ehrlichiosis due to body aches that started 4 days before her respiratory symptoms and recent history of tick bite.  Deferring Lyme testing as she has already had it.  Doxycycline for 10 days while labs are pending.  She declined work note.  Discussed labs, MDM, treatment plan, and plan for follow-up with patient . Discussed sn/sx that should prompt return to the ED. patient  agrees with plan.   Meds ordered this encounter  Medications   doxycycline (VIBRAMYCIN) 100 MG capsule    Sig: Take 1 capsule (100 mg total) by mouth 2 (two) times daily for 10 days.    Dispense:  20 capsule    Refill:  0   fluticasone (FLONASE) 50 MCG/ACT nasal spray    Sig: Place 2 sprays into both nostrils daily.    Dispense:  16 g    Refill:  0   albuterol  (VENTOLIN  HFA) 108 (90 Base) MCG/ACT inhaler    Sig: Inhale 1-2 puffs into the lungs every 4 (four) hours as needed for wheezing or shortness of breath.    Dispense:  1 each    Refill:  0   predniSONE  (DELTASONE ) 20 MG tablet    Sig: Take 2 tablets (40 mg total) by mouth daily with breakfast for 5 days.    Dispense:  10 tablet    Refill:  0   promethazine-dextromethorphan (PROMETHAZINE-DM) 6.25-15 MG/5ML syrup    Sig: Take 5 mLs by mouth 4 (four) times daily as needed for cough.    Dispense:  118 mL    Refill:  0   Spacer/Aero-Holding Chambers (AEROCHAMBER MV) inhaler    Sig: Use as instructed    Dispense:  1 each    Refill:  1      *This clinic note was created using Dragon dictation software. Therefore, there may be occasional mistakes despite careful proofreading. ?    Ethlyn Herd, MD 01/27/24 [phone]

## 2024-01-27 NOTE — Discharge Instructions (Addendum)
 Start Mucinex-D to keep the mucous thin and to decongest you.  Stop Sudafed and plain Mucinex.  You may take 1000 mg of tylenol  up to 3-4 times a day as needed for pain.  Flonase. Use a NeilMed sinus rinse with distilled water as often as you want to to reduce nasal congestion. Follow the directions on the box.   We will contact you if and only if your COVID come back positive.  I will prescribe Paxlovid in that case.  2 puffs from your albuterol  inhaler using your spacer every 4 hours for 2 days, then every 6 hours for 2 days, then as needed.  You can back off on the albuterol  if you start to improve sooner.  Finish the prednisone , even if you feel better.  I am sending you home on doxycycline for sinus infection, but this will also cover any possible tickborne illness.  Make sure you finish the doxycycline, even if you feel better. I have sent off labs testing for [facility] spotted fever and ehrlichiosis.  We will change treatment based off of your labs if necessary.  Go to www.goodrx.com to look up your medications. This will give you a list of where you can find your prescriptions at the most affordable prices. Or you can ask the pharmacist what the cash price is. This is frequently cheaper than going through insurance.

## 2024-01-30 LAB — EHRLICHIA ANTIBODY PANEL
E chaffeensis (HGE) Ab, IgG: NEGATIVE
E chaffeensis (HGE) Ab, IgM: NEGATIVE
E. Chaffeensis (HME) IgM Titer: NEGATIVE
E.Chaffeensis (HME) IgG: NEGATIVE

## 2024-01-31 LAB — SPOTTED FEVER GROUP ANTIBODIES
Spotted Fever Group IgG: 1:64 {titer} — ABNORMAL HIGH
Spotted Fever Group IgM: <1:64 {titer}

## 2024-03-29 ENCOUNTER — Ambulatory Visit: Admitting: Dermatology

## 2024-05-24 ENCOUNTER — Other Ambulatory Visit: Payer: Self-pay | Admitting: Dermatology

## 2024-05-24 DIAGNOSIS — L649 Androgenic alopecia, unspecified: Secondary | ICD-10-CM

## 2024-06-10 ENCOUNTER — Ambulatory Visit (INDEPENDENT_AMBULATORY_CARE_PROVIDER_SITE_OTHER): Admitting: Dermatology

## 2024-06-10 ENCOUNTER — Encounter: Payer: Self-pay | Admitting: Dermatology

## 2024-06-10 DIAGNOSIS — L821 Other seborrheic keratosis: Secondary | ICD-10-CM | POA: Diagnosis not present

## 2024-06-10 DIAGNOSIS — L719 Rosacea, unspecified: Secondary | ICD-10-CM

## 2024-06-10 DIAGNOSIS — W57XXXA Bitten or stung by nonvenomous insect and other nonvenomous arthropods, initial encounter: Secondary | ICD-10-CM

## 2024-06-10 DIAGNOSIS — L814 Other melanin hyperpigmentation: Secondary | ICD-10-CM | POA: Diagnosis not present

## 2024-06-10 DIAGNOSIS — L649 Androgenic alopecia, unspecified: Secondary | ICD-10-CM

## 2024-06-10 DIAGNOSIS — S80869A Insect bite (nonvenomous), unspecified lower leg, initial encounter: Secondary | ICD-10-CM | POA: Diagnosis not present

## 2024-06-10 MED ORDER — MINOXIDIL 2.5 MG PO TABS: 2.5000 mg | ORAL_TABLET | Freq: Every day | ORAL | 3 refills | Status: AC

## 2024-06-10 MED ORDER — FINASTERIDE 5 MG PO TABS: 5.0000 mg | ORAL_TABLET | Freq: Every day | ORAL | 3 refills | Status: AC

## 2024-06-10 NOTE — Patient Instructions (Addendum)
 Recommend starting moisturizer with exfoliant (Urea, Salicylic acid, or Lactic acid) one to two times daily to help smooth rough and bumpy skin.  OTC options include Cetaphil Rough and Bumpy lotion (Urea), Eucerin Roughness Relief lotion or spot treatment cream (Urea), CeraVe SA lotion/cream for Rough and Bumpy skin (Sal Acid), Gold Bond Rough and Bumpy cream (Sal Acid), and AmLactin 12% lotion/cream (Lactic Acid).  If applying in morning, also apply sunscreen to sun-exposed areas, since these exfoliating moisturizers can increase sensitivity to sun.    Due to recent changes in healthcare laws, you may see results of your pathology and/or laboratory studies on MyChart before the doctors have had a chance to review them. We understand that in some cases there may be results that are confusing or concerning to you. Please understand that not all results are received at the same time and often the doctors may need to interpret multiple results in order to provide you with the best plan of care or course of treatment. Therefore, we ask that you please give us  2 business days to thoroughly review all your results before contacting the office for clarification. Should we see a critical lab result, you will be contacted sooner.   If You Need Anything After Your Visit  If you have any questions or concerns for your doctor, please call our main line at [phone] and press option 4 to reach your doctor's medical assistant. If no one answers, please leave a voicemail as directed and we will return your call as soon as possible. Messages left after 4 pm will be answered the following business day.   You may also send us  a message via MyChart. We typically respond to MyChart messages within 1-2 business days.  For prescription refills, please ask your pharmacy to contact our office. Our fax number is [phone].  If you have an urgent issue when the clinic is closed that cannot wait until the next business  day, you can page your doctor at the number below.    Please note that while we do our best to be available for urgent issues outside of office hours, we are not available 24/7.   If you have an urgent issue and are unable to reach us , you may choose to seek medical care at your doctor's office, retail clinic, urgent care center, or emergency room.  If you have a medical emergency, please immediately call 911 or go to the emergency department.  Pager Numbers  - Dr. Hester: [phone]  - Dr. Jackquline: [phone]  - Dr. Claudene: [phone]   - Dr. Raymund: [phone]  In the event of inclement weather, please call our main line at [phone] for an update on the status of any delays or closures.  Dermatology Medication Tips: Please keep the boxes that topical medications come in in order to help keep track of the instructions about where and how to use these. Pharmacies typically print the medication instructions only on the boxes and not directly on the medication tubes.   If your medication is too expensive, please contact our office at [phone] option 4 or send us  a message through MyChart.   We are unable to tell what your co-pay for medications will be in advance as this is different depending on your insurance coverage. However, we may be able to find a substitute medication at lower cost or fill out paperwork to get insurance to cover a needed medication.   If a prior authorization is required to get your medication  covered by your insurance company, please allow us  1-2 business days to complete this process.  Drug prices often vary depending on where the prescription is filled and some pharmacies may offer cheaper prices.  The website www.goodrx.com contains coupons for medications through different pharmacies. The prices here do not account for what the cost may be with help from insurance (it may be cheaper with your insurance), but the website can give you the price  if you did not use any insurance.  - You can print the associated coupon and take it with your prescription to the pharmacy.  - You may also stop by our office during regular business hours and pick up a GoodRx coupon card.  - If you need your prescription sent electronically to a different pharmacy, notify our office through [facility] or by phone at [phone] option 4.     Si Usted Necesita Algo Despus de Su Visita  Tambin puede enviarnos un mensaje a travs de Clinical Cytogeneticist. Por lo general respondemos a los mensajes de MyChart en el transcurso de 1 a 2 das hbiles.  Para renovar recetas, por favor pida a su farmacia que se ponga en contacto con nuestra oficina. Randi lakes de fax es [place] [phone].  Si tiene un asunto urgente cuando la clnica est cerrada y que no puede esperar hasta el siguiente da hbil, puede llamar/localizar a su doctor(a) al nmero que aparece a continuacin.   Por favor, tenga en cuenta que aunque hacemos todo lo posible para estar disponibles para asuntos urgentes fuera del horario de [place], no estamos disponibles las 24 horas del da, los 7 [address]  [address] de la [place].   Si tiene un problema urgente y no puede comunicarse con nosotros, puede optar por buscar atencin mdica  en el consultorio de su doctor(a), en una clnica privada, en un centro de atencin urgente o en una sala de emergencias.  Si tiene engineer, drilling, por favor llame inmediatamente al 911 o vaya a la sala de emergencias.  Nmeros de bper  - Dr. Hester: [phone]  - Dra. Jackquline: [phone]  - Dr. Claudene: [phone]  - Dra. Kitts: [phone]  En caso de inclemencias del [place], por favor llame a nuestra lnea principal al [phone] para una actualizacin sobre el estado de cualquier retraso o cierre.  Consejos para la medicacin en dermatologa: Por favor, guarde las cajas en las que vienen los medicamentos de uso tpico para ayudarle a seguir las  instrucciones sobre dnde y cmo usarlos. Las farmacias generalmente imprimen las instrucciones del medicamento slo en las cajas y no directamente en los tubos del [place].   Si su medicamento es muy caro, por favor, pngase en contacto con landry rieger llamando al [phone] y presione la opcin 4 o envenos un mensaje a travs de Clinical Cytogeneticist.   No podemos decirle cul ser su copago por los medicamentos por adelantado ya que esto es diferente dependiendo de la cobertura de su seguro. Sin embargo, es posible que podamos encontrar un medicamento sustituto a audiological scientist un formulario para que el seguro cubra el medicamento que se considera necesario.   Si se requiere una autorizacin previa para que su compaa de seguros cubra su medicamento, por favor permtanos de 1 a 2 das hbiles para completar este proceso.  Los precios de los medicamentos varan con frecuencia dependiendo del environmental consultant de dnde se surte la receta y alguna farmacias pueden ofrecer precios ms baratos.  El sitio web www.goodrx.com tiene cupones para medicamentos de  diferentes farmacias. Los precios aqu no tienen en cuenta lo que podra costar con la ayuda del seguro (puede ser ms barato con su seguro), pero el sitio web puede darle el precio si no utiliz tourist information centre manager.  - Puede imprimir el cupn correspondiente y llevarlo con su receta a la farmacia.  - Tambin puede pasar por nuestra oficina durante el horario de atencin regular y education officer, museum una tarjeta de cupones de GoodRx.  - Si necesita que su receta se enve electrnicamente a una farmacia diferente, informe a nuestra oficina a travs de MyChart de [place] o por telfono llamando al [phone] y presione la opcin 4.

## 2024-06-10 NOTE — Progress Notes (Signed)
 Follow-Up Visit   Subjective  Grace Shepard is a 58 y.o. female who presents for the following: Androgenetic alopecia scalp, Minoxidil  2.5mg  po qd, Finasteride  5mg  qd, pt has noticed some improvement, check white spots ankles, feet, have had for while, itchy, brown spots face would like to discuss BBL   The following portions of the chart were reviewed this encounter and updated as appropriate: medications, allergies, medical history  Review of Systems:  No other skin or systemic complaints except as noted in HPI or Assessment and Plan.  Objective  Well appearing patient in no apparent distress; mood and affect are within normal limits.   A focused examination was performed of the following areas: scalp  Relevant exam findings are noted in the Assessment and Plan.  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  Regrowth noted at BL temporal hairline when compared to baseline photos  Chronic and persistent condition with duration or expected duration over one year. Condition is improving with treatment but not currently at goal.   Female Androgenic Alopecia is a chronic condition related to genetics and/or hormonal changes.  In women androgenetic alopecia is commonly associated with menopause but may occur any time after puberty.  It causes hair thinning primarily on the crown with widening of the part and temporal hairline recession.  Can use OTC Rogaine  (minoxidil ) 5% solution/foam as directed.  Oral treatments in female patients who have no contraindication may include : - Low dose oral minoxidil  1.25 - 5mg  daily - Spironolactone 50 - 100mg  bid - Finasteride  2.5 - 5 mg daily Adjunctive therapies include: - Low Level Laser Light Therapy (LLLT) - Platelet-rich plasma injections (PRP) - Hair Transplants  or scalp reduction   Treatment Plan: Cont Minoxidil  2.5mg  1 po qd Cont Finasteride  5mg  1 po qd  Doses of oral minoxidil  for hair loss are considered 'low dose'. This is because the doses used for hair loss are much lower than the doses which are used for conditions such as high blood pressure (hypertension). The doses used for hypertension are 10-40mg  per day.  Side effects are uncommon at the low doses (up to 2.5 mg/day) used to treat hair loss. Potential side effects, more commonly seen at higher doses, include: Increase in hair growth (hypertrichosis) elsewhere on face and body Temporary hair shedding upon starting medication which may last up to 4 weeks Ankle swelling, fluid retention, rapid weight gain more than 5 pounds Low blood pressure and feeling lightheaded or dizzy when standing up quickly Fast or irregular heartbeat Headaches  Counseled that finasteride  can decrease libido (sexual drive). Advised it should not be taken by pregnant women or women who could become pregnant. Advised not to donate blood products while taking this medication. Advised if medication is stopped, they may lose the hair the medication has been helping to grow.  Long term medication management.  Patient is using long term (months to years) prescription medication  to control their dermatologic condition.  These medications require periodic monitoring to evaluate for efficacy and side effects and may require periodic laboratory monitoring.    INSECT BITE REACTION Exam: multiple healing excoriated paps bil feet  Treatment Plan: Benign, observe.    Recommend DEET containing insect repellant (25% DEET or higher) such as [place] Off or Repel  Sportsmen and wear protective clothing when outdoors.   SEBORRHEIC KERATOSIS Bil feet, legs Exam: multiple white stuck on waxy papules and macules, waxy macules/paps legs  Treatment: Discussed LN2 for legs but do not recommend for feet due to higher risk discolored  scar Discussed cosmetic procedure LN2, noncovered.  $60 for 1st lesion and $15 for each additional lesion if done on the same day.  Maximum charge $350.  One touch-up treatment included no charge.  Discussed risks of treatment including dyspigmentation, small scar, and/or recurrence. Recommend daily broad spectrum sunscreen SPF 30+/photoprotection to treated areas once healed.   For feet recommend starting moisturizer with exfoliant (Urea, Salicylic acid, or Lactic acid) one to two times daily to help smooth rough and bumpy skin.  OTC options include Cetaphil Rough and Bumpy lotion (Urea), Eucerin Roughness Relief lotion or spot treatment cream (Urea), CeraVe SA lotion/cream for Rough and Bumpy skin (Sal Acid), Gold Bond Rough and Bumpy cream (Sal Acid), and AmLactin 12% lotion/cream (Lactic Acid).  If applying in morning, also apply sunscreen to sun-exposed areas, since these exfoliating moisturizers can increase sensitivity to sun.  Sample of Amlactin and Cerave SA given to patient  LENTIGOS face Exam: brown macules face  Treatment Plan: Counseling for BBL / IPL / Laser and Coordination of Care Discussed the treatment option of Broad Band Light (BBL) /Intense Pulsed Light (IPL)/ Laser for skin discoloration, including brown spots and redness.  Typically we recommend at least 1-3 treatment sessions about 5-8 weeks apart for best results.  Cannot have tanned skin when BBL performed, and regular use of sunscreen/photoprotection is advised after the procedure to help maintain results. The patient's condition may also require maintenance treatments in the future.  The fee for BBL / laser treatments is $350 per treatment session for the whole face.  A fee can be quoted for other parts of the body.  Insurance typically does not pay for BBL/laser treatments and therefore the fee is an out-of-pocket cost. Recommend prophylactic valtrex treatment, patient declines Valtrex.  ROSACEA face Exam Mid face  mild erythema with telangiectasias   Chronic and persistent condition with duration or expected duration over one year. Condition is symptomatic / bothersome to patient. Not to goal.   Rosacea is a chronic progressive skin condition usually affecting the face of adults, causing redness and/or acne bumps. It is treatable but not curable. It sometimes affects the eyes (ocular rosacea) as well. It may respond to topical and/or systemic medication and can flare with stress, sun exposure, alcohol, exercise, topical steroids (including hydrocortisone/cortisone 10) and some foods.  Daily application of broad spectrum spf 30+ sunscreen to face is recommended to reduce flares.   Treatment Plan Counseling for BBL / IPL / Laser and Coordination of Care Discussed the treatment option of Broad Band Light (BBL) /Intense Pulsed Light (IPL)/ Laser for skin discoloration, including brown spots and redness.  Typically we recommend at least 1-3 treatment sessions about 5-8 weeks apart for best results.  Cannot have tanned skin when BBL performed, and regular use of sunscreen/photoprotection is advised after the procedure to help maintain results. The patient's condition may also require maintenance treatments in the future.  The fee for BBL / laser treatments is $350 per treatment session for the whole face.  A fee can be quoted for other parts of the body.  Insurance typically does not pay for BBL/laser treatments and therefore the fee is an out-of-pocket cost. Recommend prophylactic valtrex treatment,  patient declines Valtrex.   ANDROGENETIC  ALOPECIA   Related Medications finasteride  (PROSCAR ) 5 MG tablet Take 1 tablet (5 mg total) by mouth daily. minoxidil  (LONITEN ) 2.5 MG tablet Take 1 tablet (2.5 mg total) by mouth daily.  Return in about 1 year (around 06/10/2025) for Androgenetic Alopecia f/u, PRN BBL face for Rosacea/Lentigos.  I, Grayce Saunas, RMA, am acting as scribe for Rexene Rattler, MD  .   Documentation: I have reviewed the above documentation for accuracy and completeness, and I agree with the above.  Rexene Rattler, MD

## 2024-07-14 ENCOUNTER — Ambulatory Visit (INDEPENDENT_AMBULATORY_CARE_PROVIDER_SITE_OTHER): Payer: Self-pay | Admitting: Dermatology

## 2024-07-14 DIAGNOSIS — L814 Other melanin hyperpigmentation: Secondary | ICD-10-CM

## 2024-07-14 DIAGNOSIS — L719 Rosacea, unspecified: Secondary | ICD-10-CM

## 2024-07-14 NOTE — Patient Instructions (Addendum)
 Counseling for BBL / IPL / Laser and Coordination of Care Discussed the treatment option of Broad Band Light (BBL) /Intense Pulsed Light (IPL)/ Laser for skin discoloration, including brown spots and redness.  Typically we recommend at least 1-3 treatment sessions about 5-8 weeks apart for best results.  Cannot have tanned skin when BBL performed, and regular use of sunscreen/photoprotection is advised after the procedure to help maintain results. The patient's condition may also require maintenance treatments in the future.  The fee for BBL / laser treatments is $350 per treatment session for the whole face.  A fee can be quoted for other parts of the body.  Insurance typically does not pay for BBL/laser treatments and therefore the fee is an out-of-pocket cost. Recommend prophylactic valtrex  treatment. Once scheduled for procedure, will send Rx in prior to patient's appointment.     Due to recent changes in healthcare laws, you may see results of your pathology and/or laboratory studies on MyChart before the doctors have had a chance to review them. We understand that in some cases there may be results that are confusing or concerning to you. Please understand that not all results are received at the same time and often the doctors may need to interpret multiple results in order to provide you with the best plan of care or course of treatment. Therefore, we ask that you please give us  2 business days to thoroughly review all your results before contacting the office for clarification. Should we see a critical lab result, you will be contacted sooner.   If You Need Anything After Your Visit  If you have any questions or concerns for your doctor, please call our main line at [phone] and press option 4 to reach your doctor's medical assistant. If no one answers, please leave a voicemail as directed and we will return your call as soon as possible. Messages left after 4 pm will be answered the  following business day.   You may also send us  a message via MyChart. We typically respond to MyChart messages within 1-2 business days.  For prescription refills, please ask your pharmacy to contact our office. Our fax number is [phone].  If you have an urgent issue when the clinic is closed that cannot wait until the next business day, you can page your doctor at the number below.    Please note that while we do our best to be available for urgent issues outside of office hours, we are not available 24/7.   If you have an urgent issue and are unable to reach us , you may choose to seek medical care at your doctor's office, retail clinic, urgent care center, or emergency room.  If you have a medical emergency, please immediately call 911 or go to the emergency department.  Pager Numbers  - Dr. Hester: [phone]  - Dr. Jackquline: [phone]  - Dr. Claudene: [phone]   - Dr. Raymund: [phone]  In the event of inclement weather, please call our main line at [phone] for an update on the status of any delays or closures.  Dermatology Medication Tips: Please keep the boxes that topical medications come in in order to help keep track of the instructions about where and how to use these. Pharmacies typically print the medication instructions only on the boxes and not directly on the medication tubes.   If your medication is too expensive, please contact our office at [phone] option 4 or send us  a message through MyChart.   We  are unable to tell what your co-pay for medications will be in advance as this is different depending on your insurance coverage. However, we may be able to find a substitute medication at lower cost or fill out paperwork to get insurance to cover a needed medication.   If a prior authorization is required to get your medication covered by your insurance company, please allow us  1-2 business days to complete this process.  Drug prices often vary  depending on where the prescription is filled and some pharmacies may offer cheaper prices.  The website www.goodrx.com contains coupons for medications through different pharmacies. The prices here do not account for what the cost may be with help from insurance (it may be cheaper with your insurance), but the website can give you the price if you did not use any insurance.  - You can print the associated coupon and take it with your prescription to the pharmacy.  - You may also stop by our office during regular business hours and pick up a GoodRx coupon card.  - If you need your prescription sent electronically to a different pharmacy, notify our office through [facility] or by phone at [phone] option 4.     Si Usted Necesita Algo Despus de Su Visita  Tambin puede enviarnos un mensaje a travs de Clinical cytogeneticist. Por lo general respondemos a los mensajes de MyChart en el transcurso de 1 a 2 das hbiles.  Para renovar recetas, por favor pida a su farmacia que se ponga en contacto con nuestra oficina. Randi lakes de fax es [place] [phone].  Si tiene un asunto urgente cuando la clnica est cerrada y que no puede esperar hasta el siguiente da hbil, puede llamar/localizar a su doctor(a) al nmero que aparece a continuacin.   Por favor, tenga en cuenta que aunque hacemos todo lo posible para estar disponibles para asuntos urgentes fuera del horario de [place], no estamos disponibles las 24 horas del da, los 7 [address]  [address] de la [place].   Si tiene un problema urgente y no puede comunicarse con nosotros, puede optar por buscar atencin mdica  en el consultorio de su doctor(a), en una clnica privada, en un centro de atencin urgente o en una sala de emergencias.  Si tiene Engineer, drilling, por favor llame inmediatamente al 911 o vaya a la sala de emergencias.  Nmeros de bper  - Dr. Hester: [phone]  - Dra. Jackquline: [phone]  - Dr. Claudene: [phone]  - Dra.  Kitts: [phone]  En caso de inclemencias del [place], por favor llame a nuestra lnea principal al [phone] para una actualizacin sobre el estado de cualquier retraso o cierre.  Consejos para la medicacin en dermatologa: Por favor, guarde las cajas en las que vienen los medicamentos de uso tpico para ayudarle a seguir las instrucciones sobre dnde y cmo usarlos. Las farmacias generalmente imprimen las instrucciones del medicamento slo en las cajas y no directamente en los tubos del [place].   Si su medicamento es muy caro, por favor, pngase en contacto con landry rieger llamando al [phone] y presione la opcin 4 o envenos un mensaje a travs de Clinical cytogeneticist.   No podemos decirle cul ser su copago por los medicamentos por adelantado ya que esto es diferente dependiendo de la cobertura de su seguro. Sin embargo, es posible que podamos encontrar un medicamento sustituto a Audiological scientist un formulario para que el seguro cubra el medicamento que se considera necesario.   Si se requiere una autorizacin  previa para que su compaa de seguros malta su medicamento, por favor permtanos de 1 a 2 das hbiles para completar este proceso.  Los precios de los medicamentos varan con frecuencia dependiendo del Environmental consultant de dnde se surte la receta y alguna farmacias pueden ofrecer precios ms baratos.  El sitio web www.goodrx.com tiene cupones para medicamentos de Health and safety inspector. Los precios aqu no tienen en cuenta lo que podra costar con la ayuda del seguro (puede ser ms barato con su seguro), pero el sitio web puede darle el precio si no utiliz Tourist information centre manager.  - Puede imprimir el cupn correspondiente y llevarlo con su receta a la farmacia.  - Tambin puede pasar por nuestra oficina durante el horario de atencin regular y Education officer, museum una tarjeta de cupones de GoodRx.  - Si necesita que su receta se enve electrnicamente a una farmacia diferente, informe a nuestra oficina a  travs de MyChart de [place] o por telfono llamando al [phone] y presione la opcin 4.

## 2024-07-14 NOTE — Progress Notes (Signed)
 Follow-Up Visit   Subjective  Grace Shepard is a 58 y.o. female who presents for the following:BBL treatment to the face for rosacea and lentigines. She has had treatment in the past many years ago.    The following portions of the chart were reviewed this encounter and updated as appropriate: medications, allergies, medical history  Review of Systems:  No other skin or systemic complaints except as noted in HPI or Assessment and Plan.  Objective  Well appearing patient in no apparent distress; mood and affect are within normal limits.  A focused examination was performed of the following areas: Face  Relevant physical exam findings are noted in the Assessment and Plan.  face Scattered tan macules.  face Mid face erythema with telangiectasias         1st pass   2nd pass   3rd pass  Assessment & Plan   LENTIGINES face Photorejuvenation - face Prior to the procedure, the patient's past medical history, medications, allergies, and the rare but potential risks and complications were reviewed with the patient and a signed consent was obtained.  Pre and post treatment care was discussed and instructions provided.   [place] BBL - 07/14/24 1100      Patient Details   Skin Type: II    Anesthestic Cream Applied: No    Photo Takes: Yes    Consent Signed: Yes      Treatment Details   Date: 07/14/24    Treatment #: 1    Area: face    Filter: 1st Pass;2nd Pass      1st Pass   Location: F    Device: 515 Filter    BBL j/cm2: 10    PW Msec Sec: 10    Cooling Temp: 15    Pulses: 101    15x45: This crystal used.      2nd Pass   Location: Other   forehead, cheeks, nose, chin, upper lip   Device: 515 Filter    BBL j/cm2: 12    PW Msec Sec: 10    Cooling Temp: 15    Pulses: 197    15x15: This crystal used.      Laser safety: Patient was advised in laser safety.  Patient was fitted with laser safety goggles and advised to keep eyes closed during procedure with  goggles on. Staff and provider ensured that patient and their own safety goggles were also on and eyes protected during procedure. Laser room door was secured and locked from the inside. Laser room door has laser safety sign affixed to the outside of the door.   Patient tolerated the procedure well.   [place] avoidance was stressed. The patient will call with any problems, questions or concerns prior to their next appointment.  Patient was given initial BBL sample packet and ice gel mask.     ROSACEA face Rosacea is a chronic progressive skin condition usually affecting the face of adults, causing redness and/or acne bumps. It is treatable but not curable. It sometimes affects the eyes (ocular rosacea) as well. It may respond to topical and/or systemic medication and can flare with stress, sun exposure, alcohol, exercise, topical steroids (including hydrocortisone/cortisone 10) and some foods.  Daily application of broad spectrum spf 30+ sunscreen to face is recommended to reduce flares. Photorejuvenation - face Prior to the procedure, the patient's past medical history, medications, allergies, and the rare but potential risks and complications were reviewed with the patient and a signed consent was obtained.  Pre and post treatment care was discussed and instructions provided.   [place] BBL - 07/14/24 1100      Patient Details   Skin Type: II    Anesthestic Cream Applied: No    Photo Takes: Yes    Consent Signed: Yes      Treatment Details   Date: 07/14/24    Treatment #: 1    Area: face    Filter: 3rd Pass       3rd Pass   Location: Other   mid face, nose, chin   Device: 560 Filter    BBL j/cm2: 15    PW Msec Sec: 15    Cooling Temp: 15    Pulses: 90    15x15: This crystal used.       Patient tolerated the procedure well.   [place] avoidance was stressed. The patient will call with any problems, questions or concerns prior to their next appointment.      Return as  scheduled, for TBSE.  IAndrea Kerns, CMA, am acting as scribe for Rexene Rattler, MD .   Documentation: I have reviewed the above documentation for accuracy and completeness, and I agree with the above.  Rexene Rattler, MD

## 2024-07-16 ENCOUNTER — Ambulatory Visit
Admission: EM | Admit: 2024-07-16 | Discharge: 2024-07-16 | Disposition: A | Discharge status: Home / Self Care | Attending: Family Medicine | Admitting: Family Medicine

## 2024-07-16 ENCOUNTER — Ambulatory Visit: Payer: Self-pay | Admitting: Nurse Practitioner

## 2024-07-16 ENCOUNTER — Ambulatory Visit

## 2024-07-16 DIAGNOSIS — J209 Acute bronchitis, unspecified: Secondary | ICD-10-CM

## 2024-07-16 DIAGNOSIS — J4521 Mild intermittent asthma with (acute) exacerbation: Secondary | ICD-10-CM

## 2024-07-16 DIAGNOSIS — R051 Acute cough: Secondary | ICD-10-CM | POA: Diagnosis not present

## 2024-07-16 MED ORDER — ALBUTEROL SULFATE HFA 108 (90 BASE) MCG/ACT IN AERS: 1.0000 | INHALATION_SPRAY | Freq: Four times a day (QID) | RESPIRATORY_TRACT | 0 refills | Status: DC | PRN

## 2024-07-16 MED ORDER — PREDNISONE 20 MG PO TABS: 40.0000 mg | ORAL_TABLET | Freq: Every day | ORAL | 0 refills | Status: AC

## 2024-07-16 MED ORDER — AZITHROMYCIN 250 MG PO TABS: 250.0000 mg | ORAL_TABLET | Freq: Every day | ORAL | 0 refills | Status: DC

## 2024-07-16 NOTE — ED Provider Notes (Signed)
 [facility]    CSN: [id number] Arrival date & time: 07/16/24  9146      History   Chief Complaint Chief Complaint  Patient presents with   Generalized Body Aches   Cough    HPI DAVID TOWSON is a 58 y.o. female  presents for evaluation of URI symptoms for 7 days. Patient reports associated symptoms of cough, congestion, body aches, wheezing. Denies N/V/D, fevers, sore throat, ear pain, shortness of breath. Patient does  have a hx of asthma.  Has an albuterol  inhaler has been using with temporary improvement, last used yesterday.  Patient is not an active smoker.   Reports family have similar symptoms and was exposed to walking pneumonia via family member.  Pt has taken Promethazine  DM, humidifier, nasal spray OTC for symptoms. Pt has no other concerns at this time.    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/21/2022   Chronic low back pain (Right) w/o sciatica 11/06/2022   Low back pain of over 3 months duration 11/06/2022   Abnormal NCS (nerve conduction studies) (02/12/2016) 11/06/2022   Spondylosis without myelopathy or radiculopathy, lumbosacral region 11/06/2022   Lichen sclerosus 09/24/2021   Class 1 obesity due to excess calories with body mass index (BMI) of 30.0 to 30.9 in adult 02/19/2021   Eczema  12/11/2020   Postmenopausal atrophic vaginitis 12/11/2020   Other spondylosis, sacral and sacrococcygeal region 02/08/2020   Enthesopathy of hip region (Left) 02/08/2020   Greater trochanteric bursitis of hips (Bilateral) 02/08/2020   Chronic sacroiliac joint pain (Right) 02/07/2020   Sacroiliac joint dysfunction (Bilateral) (R>L) 02/07/2020   Trochanteric bursitis of hip (Bilateral) 02/07/2020   Chronic hip pain (Left) 02/07/2020   Trochanteric bursitis, right hip 02/07/2020   Chronic pain syndrome 01/17/2020   Chronic lower extremity pain (2ry area of Pain) (Bilateral) (R>L) 01/17/2020   Chronic hip pain (Bilateral) (R>L) 01/17/2020   Chronic upper extremity pain (Bilateral) 01/17/2020   Cervicalgia (Right) 01/17/2020   Chronic occipital headache (Right) 01/17/2020   Cervicogenic headache (Right) 01/17/2020   Occipital neuralgia (lesser occipital nerve) (Right) 01/17/2020   Cervical facet syndrome (Right) 01/17/2020   Cervico-occipital neuralgia (Right) 01/17/2020   Chronic sacroiliac joint pain (Bilateral) (R>L) 01/17/2020   Lumbar facet syndrome (Bilateral) (R>L) 01/17/2020   HLD (hyperlipidemia) 03/04/2019   Anxiety and depression 05/20/2016   GERD (gastroesophageal reflux disease) 06/15/2015   Chronic low back pain (1ry area of Pain) (Bilateral) (R>L) w/o sciatica 06/02/2015   Insomnia 05/22/2015   Fibromyalgia 04/21/2015   Migraines 04/21/2015   Acquired hypothyroidism 12/19/2014    Past Surgical History:  Procedure Laterality Date   CHOLECYSTECTOMY N/A 02/08/2022   Procedure: LAPAROSCOPIC CHOLECYSTECTOMY WITH INTRAOPERATIVE CHOLANGIOGRAM;  Surgeon: Dessa Reyes ORN, MD;  Location: [facility];  Service: General;  Laterality: N/A;  Shelba Rakers, RNFA to assist   TUBAL LIGATION      OB History   No obstetric history on file.      Home Medications    Prior to Admission medications   Medication Sig Start Date End Date Taking? Authorizing Provider  acetaminophen   (TYLENOL ) 325 MG tablet Take by mouth.   Yes [provider]  albuterol  (VENTOLIN  HFA) 108 (90 Base) MCG/ACT inhaler Inhale 1-2 puffs into the lungs every 6 (six) hours as needed for wheezing or shortness of breath. 07/16/24  Yes Tylen Leverich, Jodi R, NP  azithromycin  (ZITHROMAX ) 250 MG tablet Take 1 tablet (250 mg total) by mouth daily. Take first 2 tablets together, then 1 every day until finished. 07/18/24  Yes Myca Perno, Jodi R, NP  baclofen  (LIORESAL ) 10 MG tablet Take 1 tablet (10 mg total) by mouth 3 (three) times daily as needed for muscle spasms. 01/18/22  Yes Antonette Angeline ORN, NP  cetirizine (ZYRTEC) 10 MG tablet Take 10 mg by mouth daily.   Yes [provider]  Cholecalciferol (VITAMIN D3) 125 MCG (5000 UT) CAPS Take 4,000 Units by mouth.   Yes [provider]  clobetasol  cream (TEMOVATE ) 0.05 % APPLY TO THE AFFECTED AREAS HANDS, FINGERS TWICE A DAY UNTIL IMPROVED. AVOID FACE, GROIN, UNDERARMS 08/07/23  Yes Stewart, Tara, MD  escitalopram (LEXAPRO) 10 MG tablet TAKE 1 TABLET ONCE DAILY AND CONTINUE 01/12/24  Yes [provider]  estradiol  (ESTRACE ) 0.1 MG/GM vaginal cream Place 1 Applicatorful vaginally 3 (three) times a week. 05/25/21  Yes Baity, Angeline ORN, NP  finasteride  (PROSCAR ) 5 MG tablet Take 1 tablet (5 mg total) by mouth daily. 06/10/24  Yes Jackquline Sawyer, MD  fluticasone  (FLONASE ) 50 MCG/ACT nasal spray Place 2 sprays into both nostrils daily. 01/27/24  Yes Van Knee, MD  levothyroxine  (SYNTHROID ) 75 MCG tablet Take 1 tablet (75 mcg total) by mouth daily. 01/18/22  Yes Baity, Angeline ORN, NP  minoxidil  (LONITEN ) 2.5 MG tablet Take 1 tablet (2.5 mg total) by mouth daily. 06/10/24  Yes Jackquline Sawyer, MD  montelukast  (SINGULAIR ) 10 MG tablet TAKE 1 TABLET AT BEDTIME 06/11/21  Yes Baity, Angeline ORN, NP  ondansetron  (ZOFRAN -ODT) 4 MG disintegrating tablet Take 1 tablet (4 mg total) by mouth every 8 (eight) hours as needed. 12/23/22  Yes Claudene Rover, MD  pantoprazole   (PROTONIX ) 40 MG tablet Take 1 tablet (40 mg total) by mouth daily. Patient taking differently: Take 40 mg by mouth 2 (two) times daily. 02/18/18  Yes Antonette Angeline ORN, NP  predniSONE  (DELTASONE ) 20 MG tablet Take 2 tablets (40 mg total) by mouth daily with breakfast for 5 days. 07/16/24 07/21/24 Yes Anea Fodera, Jodi R, NP  pregabalin  (LYRICA ) 75 MG capsule Take 1 capsule (75 mg total) by mouth 2 (two) times daily. 12/11/20  Yes Antonette Angeline ORN, NP  promethazine -dextromethorphan (PROMETHAZINE -DM) 6.25-15 MG/5ML syrup Take 5 mLs by mouth 4 (four) times daily as needed for cough. 01/27/24  Yes Van Knee, MD  rosuvastatin  (CRESTOR ) 10 MG tablet  04/30/22  Yes [provider]  Spacer/Aero-Holding Chambers (AEROCHAMBER MV) inhaler Use as instructed 01/27/24  Yes Van Knee, MD  topiramate  (TOPAMAX ) 100 MG tablet Take 100 mg by mouth daily. 04/13/21  Yes [provider]  traZODone  (DESYREL ) 50 MG tablet Take 1-2 tablets (50-100 mg total) by mouth at bedtime as needed for sleep. Patient taking differently: Take 50 mg by mouth  at bedtime as needed for sleep. 01/18/22  Yes Baity, Angeline ORN, NP  Tretinoin  (ALTRENO ) 0.05 % LOTN Apply to face every night as tolerated. 12/03/22  Yes Jackquline Sawyer, MD  triamcinolone  cream (KENALOG ) 0.1 % APPLY 1 APPLICATION TOPICALLY TWICE A DAY AS NEEDED. AVOID FACE, GROIN, AXILLA 06/12/21  Yes Baity, Angeline ORN, NP  vitamin B-12 (CYANOCOBALAMIN) 1000 MCG tablet Take 50 mcg by mouth every other day.    Yes [provider]  vitamin E 180 MG (400 UNITS) capsule Take 400 Units by mouth daily.   Yes [provider]  divalproex (DEPAKOTE) 250 MG DR tablet Take by mouth. 10/25/22 10/25/23  [provider]    Family History Family History  Problem Relation Age of Onset   Drug abuse Sister    Alcohol abuse Sister    Depression Sister    Rheum arthritis Father    Congestive Heart Failure Father    Depression Father    Diabetes Father     Hearing loss Father    Osteoarthritis Mother    Stroke Mother    Aneurysm Mother    Breast cancer Maternal Aunt    Heart attack Maternal Grandfather    Diabetes Brother    Alcohol abuse Paternal Uncle    Arthritis Sister    Depression Sister    Heart disease Sister    Crohn's disease Sister     Social History Social History   Tobacco Use   Smoking status: Never   Smokeless tobacco: Never  Vaping Use   Vaping status: Never Used  Substance Use Topics   Alcohol use: No   Drug use: No     Allergies   Bactrim [sulfamethoxazole-trimethoprim], Gabapentin, and Tramadol   Review of Systems Review of Systems  HENT:  Positive for congestion.   Respiratory:  Positive for cough and wheezing.   Musculoskeletal:  Positive for myalgias.     Physical Exam Triage Vital Signs ED Triage Vitals  Encounter Vitals Group     BP 07/16/24 0923 119/83     Girls Systolic BP Percentile --      Girls Diastolic BP Percentile --      Boys Systolic BP Percentile --      Boys Diastolic BP Percentile --      Pulse Rate 07/16/24 0923 86     Resp 07/16/24 0923 16     Temp 07/16/24 0923 98.3 F (36.8 C)     Temp Source 07/16/24 0923 Oral     SpO2 07/16/24 0923 97 %     Weight --      Height --      Head Circumference --      Peak Flow --      Pain Score 07/16/24 0921 4     Pain Loc --      Pain Education --      Exclude from Growth Chart --    No data found.  Updated Vital Signs BP 119/83 (BP Location: Left Arm)   Pulse 86   Temp 98.3 F (36.8 C) (Oral)   Resp 16   LMP 12/12/2014 (Approximate)   SpO2 97%   Visual Acuity Right Eye Distance:   Left Eye Distance:   Bilateral Distance:    Right Eye Near:   Left Eye Near:    Bilateral Near:     Physical Exam Vitals and nursing note reviewed.  Constitutional:      General: She is not in acute distress.  Appearance: She is well-developed. She is not ill-appearing.  HENT:     Head: Normocephalic and atraumatic.      Right Ear: Tympanic membrane and ear canal normal.     Left Ear: Tympanic membrane and ear canal normal.     Nose: Congestion present.     Mouth/Throat:     Mouth: Mucous membranes are moist.     Pharynx: Oropharynx is clear. Uvula midline. No oropharyngeal exudate or posterior oropharyngeal erythema.     Tonsils: No tonsillar exudate or tonsillar abscesses.  Eyes:     Conjunctiva/sclera: Conjunctivae normal.     Pupils: Pupils are equal, round, and reactive to light.  Cardiovascular:     Rate and Rhythm: Normal rate and regular rhythm.     Heart sounds: Normal heart sounds.  Pulmonary:     Effort: Pulmonary effort is normal.     Breath sounds: Normal breath sounds. No wheezing, rhonchi or rales.  Musculoskeletal:     Cervical back: Normal range of motion and neck supple.  Lymphadenopathy:     Cervical: No cervical adenopathy.  Skin:    General: Skin is warm and dry.  Neurological:     General: No focal deficit present.     Mental Status: She is alert and oriented to person, place, and time.  Psychiatric:        Mood and Affect: Mood normal.        Behavior: Behavior normal.      UC Treatments / Results  Labs (all labs ordered are listed, but only abnormal results are displayed) Labs Reviewed - No data to display  EKG   Radiology No results found.  Procedures Procedures (including critical care time)  Medications Ordered in UC Medications - No data to display  Initial Impression / Assessment and Plan / UC Course  I have reviewed the triage vital signs and the nursing notes.  Pertinent labs & imaging results that were available during my care of the patient were reviewed by me and considered in my medical decision making (see chart for details).     Declines nebulizer.  Wet read of chest x-ray without obvious consolidation, will contact for any positive results based on radiology overread once available.  Discussed bronchitis with asthma exacerbation.  Refilled  albuterol  inhaler will do prednisone  daily for 5 days.  She will continue previous prescription for Promethazine  DM as needed.  Provisional prescription for azithromycin  provided with instruction not to take unless symptoms do not improve or worsen over the next 2 days and patient verbalized understanding.  Encourage rest fluids and PCP follow-up 2 to 3 days for recheck.  ER precautions reviewed. Final Clinical Impressions(s) / UC Diagnoses   Final diagnoses:  Acute cough  Mild intermittent asthma with acute exacerbation  Acute bronchitis, unspecified organism     Discharge Instructions      I have refilled your albuterol  inhaler to use as needed for wheezing or shortness of breath.  Start prednisone  daily for 5 days.  Continue Promethazine  DM as previously prescribed.  A provisional prescription for azithromycin  has been provided.  Please do not take unless your symptoms do not improve or worsen over the next 2 days.  Lots of rest and fluids.  Follow-up with your PCP in 2 to 3 days for recheck.  Hope you feel better soon!     ED Prescriptions     Medication Sig Dispense Auth. Provider   albuterol  (VENTOLIN  HFA) 108 (90 Base) MCG/ACT inhaler Inhale 1-2  puffs into the lungs every 6 (six) hours as needed for wheezing or shortness of breath. 1 each Rhythm Wigfall, Jodi R, NP   predniSONE  (DELTASONE ) 20 MG tablet Take 2 tablets (40 mg total) by mouth daily with breakfast for 5 days. 10 tablet Aviv Lengacher, Jodi R, NP   azithromycin  (ZITHROMAX ) 250 MG tablet Take 1 tablet (250 mg total) by mouth daily. Take first 2 tablets together, then 1 every day until finished. 6 tablet Delitha Elms, Jodi R, NP      PDMP not reviewed this encounter.   Loreda Myla SAUNDERS, NP 07/16/24 1031

## 2024-07-16 NOTE — Discharge Instructions (Addendum)
 I have refilled your albuterol  inhaler to use as needed for wheezing or shortness of breath.  Start prednisone  daily for 5 days.  Continue Promethazine  DM as previously prescribed.  A provisional prescription for azithromycin  has been provided.  Please do not take unless your symptoms do not improve or worsen over the next 2 days.  Lots of rest and fluids.  Follow-up with your PCP in 2 to 3 days for recheck.  Hope you feel better soon!

## 2024-07-16 NOTE — ED Triage Notes (Signed)
 Pt c/o cough, congestion, body aches x1week  Pt states her torso is sore due to cough

## 2024-08-09 ENCOUNTER — Encounter: Payer: Self-pay | Admitting: Otolaryngology

## 2024-08-12 ENCOUNTER — Ambulatory Visit
Admission: EM | Admit: 2024-08-12 | Discharge: 2024-08-12 | Disposition: A | Discharge status: Home / Self Care | Source: Home / Self Care

## 2024-08-12 ENCOUNTER — Ambulatory Visit

## 2024-08-12 ENCOUNTER — Ambulatory Visit: Payer: Self-pay | Admitting: Nurse Practitioner

## 2024-08-12 DIAGNOSIS — R0602 Shortness of breath: Secondary | ICD-10-CM | POA: Diagnosis not present

## 2024-08-12 DIAGNOSIS — R051 Acute cough: Secondary | ICD-10-CM | POA: Diagnosis not present

## 2024-08-12 DIAGNOSIS — J4521 Mild intermittent asthma with (acute) exacerbation: Secondary | ICD-10-CM | POA: Diagnosis not present

## 2024-08-12 DIAGNOSIS — J40 Bronchitis, not specified as acute or chronic: Secondary | ICD-10-CM

## 2024-08-12 LAB — POC SOFIA SARS ANTIGEN FIA: SARS Coronavirus 2 Ag: NEGATIVE

## 2024-08-12 LAB — POCT INFLUENZA A/B
Influenza A, POC: NEGATIVE
Influenza B, POC: NEGATIVE

## 2024-08-12 MED ORDER — IPRATROPIUM-ALBUTEROL 0.5-2.5 (3) MG/3ML IN SOLN
3.0000 mL | Freq: Once | RESPIRATORY_TRACT | Status: AC
  Administered 2024-08-12: 3 mL via RESPIRATORY_TRACT

## 2024-08-12 MED ORDER — AZITHROMYCIN 250 MG PO TABS: 250.0000 mg | ORAL_TABLET | Freq: Every day | ORAL | 0 refills | Status: AC

## 2024-08-12 MED ORDER — PROMETHAZINE-DM 6.25-15 MG/5ML PO SYRP: 5.0000 mL | ORAL_SOLUTION | Freq: Four times a day (QID) | ORAL | 0 refills | Status: AC | PRN

## 2024-08-12 MED ORDER — ALBUTEROL SULFATE HFA 108 (90 BASE) MCG/ACT IN AERS: 1.0000 | INHALATION_SPRAY | Freq: Four times a day (QID) | RESPIRATORY_TRACT | 0 refills | Status: AC | PRN

## 2024-08-12 MED ORDER — PREDNISONE 20 MG PO TABS: 40.0000 mg | ORAL_TABLET | Freq: Every day | ORAL | 0 refills | Status: AC

## 2024-08-12 NOTE — ED Provider Notes (Signed)
 " [facility]    CSN: [id number] Arrival date & time: 08/12/24  1103      History   Chief Complaint Chief Complaint  Patient presents with   Cough    HPI CHEMERE STEFFLER is a 59 y.o. female  presents for evaluation of URI symptoms for 7 days. Patient reports associated symptoms of cough, congestion, body aches, shortness of breath with wheezing with new onset fever of 101 yesterday. Denies N/V/D, sore throat, ear pain. Patient does have a hx of asthma.  Has been using her albuterol  inhaler more frequently and with temporary improvement in symptoms.  Patient is not an active smoker.   Reports sick contacts via family.  pt has taken cold medicine OTC for symptoms. Pt has no other concerns at this time.    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/21/2022   Chronic low back pain (Right) w/o sciatica 11/06/2022   Low back pain of over 3 months duration 11/06/2022   Abnormal NCS (nerve conduction studies) (02/12/2016) 11/06/2022   Spondylosis without myelopathy or radiculopathy, lumbosacral region 11/06/2022   Lichen sclerosus 09/24/2021   Class 1 obesity due to excess calories with body mass index (BMI) of 30.0 to 30.9 in adult 02/19/2021   Eczema 12/11/2020   Postmenopausal atrophic vaginitis  12/11/2020   Other spondylosis, sacral and sacrococcygeal region 02/08/2020   Enthesopathy of hip region (Left) 02/08/2020   Greater trochanteric bursitis of hips (Bilateral) 02/08/2020   Chronic sacroiliac joint pain (Right) 02/07/2020   Sacroiliac joint dysfunction (Bilateral) (R>L) 02/07/2020   Trochanteric bursitis of hip (Bilateral) 02/07/2020   Chronic hip pain (Left) 02/07/2020   Trochanteric bursitis, right hip 02/07/2020   Chronic pain syndrome 01/17/2020   Chronic lower extremity pain (2ry area of Pain) (Bilateral) (R>L) 01/17/2020   Chronic hip pain (Bilateral) (R>L) 01/17/2020   Chronic upper extremity pain (Bilateral) 01/17/2020   Cervicalgia (Right) 01/17/2020   Chronic occipital headache (Right) 01/17/2020   Cervicogenic headache (Right) 01/17/2020   Occipital neuralgia (lesser occipital nerve) (Right) 01/17/2020   Cervical facet syndrome (Right) 01/17/2020   Cervico-occipital neuralgia (Right) 01/17/2020   Chronic sacroiliac joint pain (Bilateral) (R>L) 01/17/2020   Lumbar facet syndrome (Bilateral) (R>L) 01/17/2020   HLD (hyperlipidemia) 03/04/2019   Anxiety and depression 05/20/2016   GERD (gastroesophageal reflux disease) 06/15/2015   Chronic low back pain (1ry area of Pain) (Bilateral) (R>L) w/o sciatica 06/02/2015   Insomnia 05/22/2015   Fibromyalgia 04/21/2015   Migraines 04/21/2015   Acquired hypothyroidism 12/19/2014    Past Surgical History:  Procedure Laterality Date   CHOLECYSTECTOMY N/A 02/08/2022   Procedure: LAPAROSCOPIC CHOLECYSTECTOMY WITH INTRAOPERATIVE CHOLANGIOGRAM;  Surgeon: Dessa Reyes ORN, MD;  Location: [facility] ORS;  Service: General;  Laterality: N/A;  Shelba Rakers, RNFA to assist   TUBAL LIGATION      OB History   No obstetric history on file.      Home Medications    Prior to Admission medications  Medication Sig Start Date End Date Taking? Authorizing Provider  albuterol  (VENTOLIN  HFA) 108 (90 Base) MCG/ACT inhaler Inhale  1-2 puffs into the lungs every 6 (six) hours as needed for wheezing or shortness of breath. 08/12/24  Yes Cyle Kenyon, Jodi R, NP  azithromycin  (ZITHROMAX ) 250 MG tablet Take 1 tablet (250 mg total) by mouth daily. Take first 2 tablets together, then 1 every day until finished. 08/15/24  Yes Luisenrique Conran, Jodi R, NP  escitalopram (LEXAPRO) 10 MG tablet TAKE 1 TABLET ONCE DAILY AND CONTINUE 01/12/24  Yes [provider]  estradiol  (ESTRACE ) 0.1 MG/GM vaginal cream Place 1 Applicatorful vaginally 3 (three) times a week. 05/25/21  Yes Baity, Angeline ORN, NP  finasteride  (PROSCAR ) 5 MG tablet Take 1 tablet (5 mg total) by mouth daily. 06/10/24  Yes Jackquline Sawyer, MD  levothyroxine  (SYNTHROID ) 75 MCG tablet Take 1 tablet (75 mcg total) by mouth daily. 01/18/22  Yes Baity, Angeline ORN, NP  minoxidil  (LONITEN ) 2.5 MG tablet Take 1 tablet (2.5 mg total) by mouth daily. 06/10/24  Yes Jackquline Sawyer, MD  predniSONE  (DELTASONE ) 20 MG tablet Take 2 tablets (40 mg total) by mouth daily with breakfast for 5 days. 08/12/24 08/17/24 Yes Laurrie Toppin, Jodi R, NP  pregabalin  (LYRICA ) 75 MG capsule Take 1 capsule (75 mg total) by mouth 2 (two) times daily. 12/11/20  Yes Antonette Angeline ORN, NP  promethazine -dextromethorphan (PROMETHAZINE -DM) 6.25-15 MG/5ML syrup Take 5 mLs by mouth 4 (four) times daily as needed for cough. 08/12/24  Yes Zakiah Gauthreaux, Jodi R, NP  rosuvastatin  (CRESTOR ) 10 MG tablet  04/30/22  Yes [provider]  topiramate (TOPAMAX) 100 MG tablet Take 100 mg by mouth daily. 04/13/21  Yes [provider]  acetaminophen  (TYLENOL ) 325 MG tablet Take by mouth.    [provider]  baclofen  (LIORESAL ) 10 MG tablet Take 1 tablet (10 mg total) by mouth 3 (three) times daily as needed for muscle spasms. 01/18/22   Antonette Angeline ORN, NP  cetirizine (ZYRTEC) 10 MG tablet Take 10 mg by mouth daily.    [provider]  Cholecalciferol (VITAMIN D3) 125 MCG (5000 UT) CAPS Take 4,000 Units by mouth.    [provider]   clobetasol  cream (TEMOVATE ) 0.05 % APPLY TO THE AFFECTED AREAS HANDS, FINGERS TWICE A DAY UNTIL IMPROVED. AVOID FACE, GROIN, UNDERARMS 08/07/23   Stewart, Tara, MD  divalproex (DEPAKOTE) 250 MG DR tablet Take by mouth. 10/25/22 10/25/23  [provider]  fluticasone  (FLONASE ) 50 MCG/ACT nasal spray Place 2 sprays into both nostrils daily. 01/27/24   Van Knee, MD  montelukast  (SINGULAIR ) 10 MG tablet TAKE 1 TABLET AT BEDTIME 06/11/21   Antonette Angeline ORN, NP  ondansetron  (ZOFRAN -ODT) 4 MG disintegrating tablet Take 1 tablet (4 mg total) by mouth every 8 (eight) hours as needed. 12/23/22   Claudene Rover, MD  pantoprazole  (PROTONIX ) 40 MG tablet Take 1 tablet (40 mg total) by mouth daily. Patient taking differently: Take 40 mg by mouth 2 (two) times daily. 02/18/18   Antonette Angeline ORN, NP  Spacer/Aero-Holding Chambers (AEROCHAMBER MV) inhaler Use as instructed 01/27/24   Van Knee, MD  traZODone  (DESYREL ) 50 MG tablet Take 1-2 tablets (50-100 mg total) by mouth at bedtime as needed  for sleep. Patient taking differently: Take 50 mg by mouth at bedtime as needed for sleep. 01/18/22   Antonette Angeline ORN, NP  Tretinoin  (ALTRENO ) 0.05 % LOTN Apply to face every night as tolerated. 12/03/22   Jackquline Sawyer, MD  triamcinolone  cream (KENALOG ) 0.1 % APPLY 1 APPLICATION TOPICALLY TWICE A DAY AS NEEDED. AVOID FACE, GROIN, AXILLA 06/12/21   Baity, Regina W, NP  vitamin B-12 (CYANOCOBALAMIN ) 1000 MCG tablet Take 50 mcg by mouth every other day.     [provider]  vitamin E 180 MG (400 UNITS) capsule Take 400 Units by mouth daily.    [provider]    Family History Family History  Problem Relation Age of Onset   Drug abuse Sister    Alcohol abuse Sister    Depression Sister    Rheum arthritis Father    Congestive Heart Failure Father    Depression Father    Diabetes Father    Hearing loss Father    Osteoarthritis Mother    Stroke Mother    Aneurysm Mother    Breast cancer  Maternal Aunt    Heart attack Maternal Grandfather    Diabetes Brother    Alcohol abuse Paternal Uncle    Arthritis Sister    Depression Sister    Heart disease Sister    Crohn's disease Sister     Social History Social History[1]   Allergies   Bactrim [sulfamethoxazole-trimethoprim], Gabapentin, and Tramadol   Review of Systems Review of Systems  Constitutional:  Positive for fever.  HENT:  Positive for congestion.   Respiratory:  Positive for cough, shortness of breath and wheezing.   Musculoskeletal:  Positive for myalgias.     Physical Exam Triage Vital Signs ED Triage Vitals  Encounter Vitals Group     BP 08/12/24 1135 116/79     Girls Systolic BP Percentile --      Girls Diastolic BP Percentile --      Boys Systolic BP Percentile --      Boys Diastolic BP Percentile --      Pulse Rate 08/12/24 1135 (!) 106     Resp 08/12/24 1135 17     Temp 08/12/24 1135 98.5 F (36.9 C)     Temp Source 08/12/24 1135 Oral     SpO2 08/12/24 1135 95 %     Weight 08/12/24 1134 147 lb (66.7 kg)     Height --      Head Circumference --      Peak Flow --      Pain Score 08/12/24 1134 6     Pain Loc --      Pain Education --      Exclude from Growth Chart --    No data found.  Updated Vital Signs BP 116/79 (BP Location: Right Arm)   Pulse (!) 106   Temp 98.5 F (36.9 C) (Oral)   Resp 17   Wt 147 lb (66.7 kg)   LMP 12/12/2014   SpO2 95%   BMI 30.72 kg/m   Visual Acuity Right Eye Distance:   Left Eye Distance:   Bilateral Distance:    Right Eye Near:   Left Eye Near:    Bilateral Near:     Physical Exam Vitals and nursing note reviewed.  Constitutional:      General: She is not in acute distress.    Appearance: She is well-developed. She is not ill-appearing.  HENT:     Head: Normocephalic and atraumatic.  Right Ear: Tympanic membrane and ear canal normal.     Left Ear: Tympanic membrane and ear canal normal.     Nose: Congestion present.      Mouth/Throat:     Mouth: Mucous membranes are moist.     Pharynx: Oropharynx is clear. Uvula midline. No oropharyngeal exudate or posterior oropharyngeal erythema.     Tonsils: No tonsillar exudate or tonsillar abscesses.  Eyes:     Conjunctiva/sclera: Conjunctivae normal.     Pupils: Pupils are equal, round, and reactive to light.  Cardiovascular:     Rate and Rhythm: Normal rate and regular rhythm.     Heart sounds: Normal heart sounds.  Pulmonary:     Effort: Pulmonary effort is normal.     Breath sounds: Normal breath sounds. No wheezing, rhonchi or rales.  Musculoskeletal:     Cervical back: Normal range of motion and neck supple.  Lymphadenopathy:     Cervical: No cervical adenopathy.  Skin:    General: Skin is warm and dry.  Neurological:     General: No focal deficit present.     Mental Status: She is alert and oriented to person, place, and time.  Psychiatric:        Mood and Affect: Mood normal.        Behavior: Behavior normal.      UC Treatments / Results  Labs (all labs ordered are listed, but only abnormal results are displayed) Labs Reviewed  POC SOFIA SARS ANTIGEN FIA - Normal  POCT INFLUENZA A/B - Normal    EKG   Radiology No results found.  Procedures Procedures (including critical care time)  Medications Ordered in UC Medications  ipratropium-albuterol  (DUONEB) 0.5-2.5 (3) MG/3ML nebulizer solution 3 mL (3 mLs Nebulization Given 08/12/24 1220)    Initial Impression / Assessment and Plan / UC Course  I have reviewed the triage vital signs and the nursing notes.  Pertinent labs & imaging results that were available during my care of the patient were reviewed by me and considered in my medical decision making (see chart for details).     Pt requested nebulizer for [facility]. Lungs remain CTAB.  Wet read of x-ray without obvious consolidation, will contact for any positive results based on radiology overread once available.  Negative COVID and flu.   Discussed bronchitis/asthma exacerbation.  Will do prednisone  and Promethazine  DM.  Refilled albuterol  inhaler.  Provisional prescription for azithromycin  provided with instruction not to take unless symptoms do not prove or worsen by January 4.  PCP follow-up 2 to 3 days for recheck.  ER precautions reviewed. Final Clinical Impressions(s) / UC Diagnoses   Final diagnoses:  Acute cough  Shortness of breath  Mild intermittent asthma with acute exacerbation  Bronchitis     Discharge Instructions      I refilled your albuterol  inhaler to use as needed for wheezing or shortness of breath.  Start prednisone  daily for 5 days.  You may take Promethazine  DM as needed for your cough.  This medication will make you drowsy.  Do not drink alcohol or drive on this medication.  A provisional prescription for azithromycin  has been provided.  Please do not take this or symptoms do not improve or worsen by January 4.  Lots of rest and fluids and follow-up with your PCP in 2 to 3 days for recheck.  Please go to the ER for any worsening symptoms.  Hope you feel better soon!     ED Prescriptions  Medication Sig Dispense Auth. Provider   albuterol  (VENTOLIN  HFA) 108 (90 Base) MCG/ACT inhaler Inhale 1-2 puffs into the lungs every 6 (six) hours as needed for wheezing or shortness of breath. 1 each Kerrie Latour, Jodi R, NP   predniSONE  (DELTASONE ) 20 MG tablet Take 2 tablets (40 mg total) by mouth daily with breakfast for 5 days. 10 tablet Clebert Wenger, Jodi R, NP   promethazine -dextromethorphan (PROMETHAZINE -DM) 6.25-15 MG/5ML syrup Take 5 mLs by mouth 4 (four) times daily as needed for cough. 118 mL Jany Buckwalter, Jodi R, NP   azithromycin  (ZITHROMAX ) 250 MG tablet Take 1 tablet (250 mg total) by mouth daily. Take first 2 tablets together, then 1 every day until finished. 6 tablet Ky Moskowitz, Jodi R, NP      PDMP not reviewed this encounter.     [1]  Social History Tobacco Use   Smoking status: Never   Smokeless tobacco:  Never  Vaping Use   Vaping status: Never Used  Substance Use Topics   Alcohol use: No   Drug use: No     Loreda Myla SAUNDERS, NP 08/12/24 1245  "

## 2024-08-12 NOTE — ED Triage Notes (Signed)
 Sx 6 days  Headache Nasal congestion Cough Bodyaches

## 2024-08-12 NOTE — Discharge Instructions (Addendum)
 I refilled your albuterol  inhaler to use as needed for wheezing or shortness of breath.  Start prednisone  daily for 5 days.  You may take Promethazine  DM as needed for your cough.  This medication will make you drowsy.  Do not drink alcohol or drive on this medication.  A provisional prescription for azithromycin  has been provided.  Please do not take this or symptoms do not improve or worsen by January 4.  Lots of rest and fluids and follow-up with your PCP in 2 to 3 days for recheck.  Please go to the ER for any worsening symptoms.  Hope you feel better soon!

## 2024-08-24 ENCOUNTER — Other Ambulatory Visit: Payer: Self-pay | Admitting: Internal Medicine

## 2024-08-24 DIAGNOSIS — R053 Chronic cough: Secondary | ICD-10-CM

## 2024-08-24 DIAGNOSIS — J849 Interstitial pulmonary disease, unspecified: Secondary | ICD-10-CM

## 2024-08-26 ENCOUNTER — Ambulatory Visit
Admission: RE | Admit: 2024-08-26 | Discharge: 2024-08-26 | Disposition: A | Discharge status: Home / Self Care | Source: Ambulatory Visit | Attending: Internal Medicine | Admitting: Internal Medicine

## 2024-08-26 DIAGNOSIS — J849 Interstitial pulmonary disease, unspecified: Secondary | ICD-10-CM | POA: Diagnosis present

## 2024-08-26 DIAGNOSIS — R053 Chronic cough: Secondary | ICD-10-CM | POA: Diagnosis present

## 2024-08-31 ENCOUNTER — Other Ambulatory Visit: Payer: Self-pay | Admitting: Sports Medicine

## 2024-08-31 DIAGNOSIS — M7061 Trochanteric bursitis, right hip: Secondary | ICD-10-CM

## 2024-08-31 DIAGNOSIS — M65251 Calcific tendinitis, right thigh: Secondary | ICD-10-CM

## 2024-08-31 DIAGNOSIS — G8929 Other chronic pain: Secondary | ICD-10-CM

## 2024-08-31 DIAGNOSIS — M1611 Unilateral primary osteoarthritis, right hip: Secondary | ICD-10-CM

## 2024-09-06 ENCOUNTER — Other Ambulatory Visit: Payer: Self-pay | Admitting: Dermatology

## 2024-09-06 DIAGNOSIS — L309 Dermatitis, unspecified: Secondary | ICD-10-CM

## 2024-09-07 ENCOUNTER — Ambulatory Visit
Admission: RE | Admit: 2024-09-07 | Discharge: 2024-09-07 | Disposition: A | Discharge status: Home / Self Care | Source: Ambulatory Visit | Attending: Sports Medicine | Admitting: Sports Medicine

## 2024-09-07 DIAGNOSIS — M7061 Trochanteric bursitis, right hip: Secondary | ICD-10-CM | POA: Insufficient documentation

## 2024-09-07 DIAGNOSIS — M25551 Pain in right hip: Secondary | ICD-10-CM | POA: Diagnosis present

## 2024-09-07 DIAGNOSIS — G8929 Other chronic pain: Secondary | ICD-10-CM | POA: Insufficient documentation

## 2024-09-07 DIAGNOSIS — M1611 Unilateral primary osteoarthritis, right hip: Secondary | ICD-10-CM | POA: Insufficient documentation

## 2024-09-07 DIAGNOSIS — M65251 Calcific tendinitis, right thigh: Secondary | ICD-10-CM | POA: Diagnosis present

## 2024-09-20 ENCOUNTER — Ambulatory Visit: Admitting: Dermatology

## 2024-10-19 ENCOUNTER — Ambulatory Visit: Admitting: Dermatology

## 2025-06-13 ENCOUNTER — Ambulatory Visit: Admitting: Dermatology
